# Patient Record
Sex: Female | Born: 1985
Health system: Southern US, Community
[De-identification: ages and names within clinical notes are randomized; demographics above are authoritative.]

## PROBLEM LIST (undated history)

## (undated) ENCOUNTER — Inpatient Hospital Stay (HOSPITAL_COMMUNITY): Payer: Self-pay

## (undated) DIAGNOSIS — F909 Attention-deficit hyperactivity disorder, unspecified type: Secondary | ICD-10-CM

## (undated) DIAGNOSIS — T7840XA Allergy, unspecified, initial encounter: Secondary | ICD-10-CM

## (undated) DIAGNOSIS — D649 Anemia, unspecified: Secondary | ICD-10-CM

## (undated) DIAGNOSIS — R0602 Shortness of breath: Secondary | ICD-10-CM

## (undated) DIAGNOSIS — R12 Heartburn: Secondary | ICD-10-CM

## (undated) DIAGNOSIS — F32A Depression, unspecified: Secondary | ICD-10-CM

## (undated) DIAGNOSIS — R51 Headache: Secondary | ICD-10-CM

## (undated) DIAGNOSIS — F419 Anxiety disorder, unspecified: Secondary | ICD-10-CM

## (undated) DIAGNOSIS — J189 Pneumonia, unspecified organism: Secondary | ICD-10-CM

## (undated) DIAGNOSIS — S060X9A Concussion with loss of consciousness of unspecified duration, initial encounter: Secondary | ICD-10-CM

## (undated) DIAGNOSIS — F329 Major depressive disorder, single episode, unspecified: Secondary | ICD-10-CM

## (undated) DIAGNOSIS — J45909 Unspecified asthma, uncomplicated: Secondary | ICD-10-CM

## (undated) HISTORY — DX: Unspecified asthma, uncomplicated: J45.909

## (undated) HISTORY — DX: Concussion with loss of consciousness of unspecified duration, initial encounter: S06.0X9A

## (undated) HISTORY — PX: GYNECOLOGIC CRYOSURGERY: SHX857

## (undated) HISTORY — PX: COLPOSCOPY: SHX161

## (undated) HISTORY — DX: Attention-deficit hyperactivity disorder, unspecified type: F90.9

## (undated) HISTORY — PX: WISDOM TOOTH EXTRACTION: SHX21

## (undated) HISTORY — DX: Allergy, unspecified, initial encounter: T78.40XA

---
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
## 2006-04-27 ENCOUNTER — Encounter: Admission: RE | Admit: 2006-04-27 | Discharge: 2006-04-27 | Payer: Self-pay | Admitting: Nephrology

## 2008-02-17 ENCOUNTER — Emergency Department (HOSPITAL_COMMUNITY): Admission: EM | Admit: 2008-02-17 | Discharge: 2008-02-17 | Payer: Self-pay | Admitting: Emergency Medicine

## 2008-10-04 ENCOUNTER — Emergency Department (HOSPITAL_COMMUNITY): Admission: EM | Admit: 2008-10-04 | Discharge: 2008-10-04 | Payer: Self-pay | Admitting: Family Medicine

## 2009-04-18 ENCOUNTER — Emergency Department (HOSPITAL_COMMUNITY): Admission: EM | Admit: 2009-04-18 | Discharge: 2009-04-18 | Payer: Self-pay | Admitting: Family Medicine

## 2009-04-20 ENCOUNTER — Emergency Department (HOSPITAL_COMMUNITY): Admission: EM | Admit: 2009-04-20 | Discharge: 2009-04-20 | Payer: Self-pay | Admitting: Emergency Medicine

## 2009-04-22 ENCOUNTER — Emergency Department (HOSPITAL_COMMUNITY): Admission: EM | Admit: 2009-04-22 | Discharge: 2009-04-22 | Payer: Self-pay | Admitting: Family Medicine

## 2009-12-19 ENCOUNTER — Emergency Department (HOSPITAL_COMMUNITY): Admission: EM | Admit: 2009-12-19 | Discharge: 2009-12-19 | Payer: Self-pay | Admitting: Emergency Medicine

## 2010-10-28 LAB — URINALYSIS, ROUTINE W REFLEX MICROSCOPIC
Bilirubin Urine: NEGATIVE
Glucose, UA: NEGATIVE mg/dL
Hgb urine dipstick: NEGATIVE
Ketones, ur: NEGATIVE mg/dL
Nitrite: NEGATIVE
Protein, ur: NEGATIVE mg/dL
Specific Gravity, Urine: 1.007 (ref 1.005–1.030)
Urobilinogen, UA: 0.2 mg/dL (ref 0.0–1.0)
pH: 7.5 (ref 5.0–8.0)

## 2010-10-28 LAB — COMPREHENSIVE METABOLIC PANEL WITH GFR
ALT: 16 U/L (ref 0–35)
AST: 16 U/L (ref 0–37)
Albumin: 4.2 g/dL (ref 3.5–5.2)
Alkaline Phosphatase: 52 U/L (ref 39–117)
BUN: 5 mg/dL — ABNORMAL LOW (ref 6–23)
CO2: 24 meq/L (ref 19–32)
Calcium: 9.5 mg/dL (ref 8.4–10.5)
Chloride: 104 meq/L (ref 96–112)
Creatinine, Ser: 0.69 mg/dL (ref 0.4–1.2)
GFR calc non Af Amer: >60 mL/min
Glucose, Bld: 91 mg/dL (ref 70–99)
Potassium: 3.7 meq/L (ref 3.5–5.1)
Sodium: 136 meq/L (ref 135–145)
Total Bilirubin: 0.8 mg/dL (ref 0.3–1.2)
Total Protein: 6.6 g/dL (ref 6.0–8.3)

## 2010-10-28 LAB — WET PREP, GENITAL
Clue Cells Wet Prep HPF POC: NONE SEEN
Trich, Wet Prep: NONE SEEN

## 2010-10-28 LAB — LIPASE, BLOOD: Lipase: 27 U/L (ref 11–59)

## 2010-10-28 LAB — DIFFERENTIAL
Basophils Relative: 1 % (ref 0–1)
Eosinophils Absolute: 0.1 10*3/uL (ref 0.0–0.7)
Monocytes Absolute: 0.6 10*3/uL (ref 0.1–1.0)
Monocytes Relative: 6 % (ref 3–12)
Neutro Abs: 6.4 10*3/uL (ref 1.7–7.7)

## 2010-10-28 LAB — GC/CHLAMYDIA PROBE AMP, GENITAL
Chlamydia, DNA Probe: NEGATIVE
GC Probe Amp, Genital: NEGATIVE

## 2010-10-28 LAB — POCT PREGNANCY, URINE: Preg Test, Ur: NEGATIVE

## 2010-10-28 LAB — CBC
Platelets: 263 10*3/uL (ref 150–400)
RDW: 12.1 % (ref 11.5–15.5)

## 2011-02-08 ENCOUNTER — Emergency Department (HOSPITAL_COMMUNITY): Payer: Self-pay

## 2011-02-08 ENCOUNTER — Emergency Department (HOSPITAL_COMMUNITY)
Admission: EM | Admit: 2011-02-08 | Discharge: 2011-02-08 | Disposition: A | Discharge status: Home / Self Care | Payer: Self-pay | Attending: Emergency Medicine | Admitting: Emergency Medicine

## 2011-02-08 DIAGNOSIS — N898 Other specified noninflammatory disorders of vagina: Secondary | ICD-10-CM | POA: Insufficient documentation

## 2011-02-08 DIAGNOSIS — N760 Acute vaginitis: Secondary | ICD-10-CM | POA: Insufficient documentation

## 2011-02-08 DIAGNOSIS — Z79899 Other long term (current) drug therapy: Secondary | ICD-10-CM | POA: Insufficient documentation

## 2011-02-08 DIAGNOSIS — B9689 Other specified bacterial agents as the cause of diseases classified elsewhere: Secondary | ICD-10-CM | POA: Insufficient documentation

## 2011-02-08 DIAGNOSIS — N72 Inflammatory disease of cervix uteri: Secondary | ICD-10-CM | POA: Insufficient documentation

## 2011-02-08 DIAGNOSIS — N949 Unspecified condition associated with female genital organs and menstrual cycle: Secondary | ICD-10-CM | POA: Insufficient documentation

## 2011-02-08 DIAGNOSIS — A499 Bacterial infection, unspecified: Secondary | ICD-10-CM | POA: Insufficient documentation

## 2011-02-08 LAB — WET PREP, GENITAL
Trich, Wet Prep: NONE SEEN
Yeast Wet Prep HPF POC: NONE SEEN

## 2011-02-08 LAB — URINALYSIS, ROUTINE W REFLEX MICROSCOPIC
Glucose, UA: NEGATIVE mg/dL
Leukocytes, UA: NEGATIVE
Specific Gravity, Urine: 1.028 (ref 1.005–1.030)
pH: 6 (ref 5.0–8.0)

## 2011-12-18 ENCOUNTER — Inpatient Hospital Stay (HOSPITAL_COMMUNITY): Payer: Self-pay

## 2011-12-18 ENCOUNTER — Encounter (HOSPITAL_COMMUNITY): Payer: Self-pay | Admitting: *Deleted

## 2011-12-18 ENCOUNTER — Inpatient Hospital Stay (HOSPITAL_COMMUNITY)
Admission: AD | Admit: 2011-12-18 | Discharge: 2011-12-18 | Disposition: A | Discharge status: Home / Self Care | Payer: Self-pay | Source: Ambulatory Visit | Attending: Obstetrics and Gynecology | Admitting: Obstetrics and Gynecology

## 2011-12-18 DIAGNOSIS — N938 Other specified abnormal uterine and vaginal bleeding: Secondary | ICD-10-CM | POA: Insufficient documentation

## 2011-12-18 DIAGNOSIS — N949 Unspecified condition associated with female genital organs and menstrual cycle: Secondary | ICD-10-CM

## 2011-12-18 DIAGNOSIS — R109 Unspecified abdominal pain: Secondary | ICD-10-CM | POA: Insufficient documentation

## 2011-12-18 HISTORY — DX: Anemia, unspecified: D64.9

## 2011-12-18 HISTORY — DX: Major depressive disorder, single episode, unspecified: F32.9

## 2011-12-18 HISTORY — DX: Depression, unspecified: F32.A

## 2011-12-18 HISTORY — DX: Anxiety disorder, unspecified: F41.9

## 2011-12-18 LAB — WET PREP, GENITAL
Trich, Wet Prep: NONE SEEN
Yeast Wet Prep HPF POC: NONE SEEN

## 2011-12-18 LAB — CBC
HCT: 37 % (ref 36.0–46.0)
MCHC: 33.8 g/dL (ref 30.0–36.0)
Platelets: 267 10*3/uL (ref 150–400)
RDW: 13.1 % (ref 11.5–15.5)
WBC: 11.7 10*3/uL — ABNORMAL HIGH (ref 4.0–10.5)

## 2011-12-18 LAB — URINE MICROSCOPIC-ADD ON

## 2011-12-18 LAB — DIFFERENTIAL
Basophils Absolute: 0.1 10*3/uL (ref 0.0–0.1)
Basophils Relative: 0 % (ref 0–1)
Lymphocytes Relative: 28 % (ref 12–46)
Monocytes Absolute: 0.9 10*3/uL (ref 0.1–1.0)
Neutro Abs: 7.3 10*3/uL (ref 1.7–7.7)

## 2011-12-18 LAB — URINALYSIS, ROUTINE W REFLEX MICROSCOPIC
Bilirubin Urine: NEGATIVE
Glucose, UA: NEGATIVE mg/dL
Ketones, ur: NEGATIVE mg/dL
Protein, ur: NEGATIVE mg/dL

## 2011-12-18 MED ORDER — NAPROXEN SODIUM 550 MG PO TABS: 550.0000 mg | ORAL_TABLET | Freq: Two times a day (BID) | ORAL | Status: DC

## 2011-12-18 MED ORDER — KETOROLAC TROMETHAMINE 60 MG/2ML IM SOLN
60.0000 mg | Freq: Once | INTRAMUSCULAR | Status: AC
  Administered 2011-12-18: 60 mg via INTRAMUSCULAR
  Filled 2011-12-18: qty 2

## 2011-12-18 NOTE — Discharge Instructions (Signed)
Continue your birth control pills, take the pain medication as directed. Call the GYN office to schedule a follow up appointment, return here as needed.  Abnormal Uterine Bleeding Abnormal uterine bleeding can have many causes. Some cases are simply treated, while others are more serious. There are several kinds of bleeding that is considered abnormal, including:  Bleeding between periods.   Bleeding after sexual intercourse.   Spotting anytime in the menstrual cycle.   Bleeding heavier or more than normal.   Bleeding after menopause.  CAUSES  There are many causes of abnormal uterine bleeding. It can be present in teenagers, pregnant women, women during their reproductive years, and women who have reached menopause. Your caregiver will look for the more common causes depending on your age, signs, symptoms and your particular circumstance. Most cases are not serious and can be treated. Even the more serious causes, like cancer of the female organs, can be treated adequately if found in the early stages. That is why all types of bleeding should be evaluated and treated as soon as possible. DIAGNOSIS  Diagnosing the cause may take several kinds of tests. Your caregiver may:  Take a complete history of the type of bleeding.   Perform a complete physical exam and Pap smear.   Take an ultrasound on the abdomen showing a picture of the female organs and the pelvis.   Inject dye into the uterus and Fallopian tubes and X-ray them (hysterosalpingogram).   Place fluid in the uterus and do an ultrasound (sonohysterogrqphy).   Take a CT scan to examine the female organs and pelvis.   Take an MRI to examine the female organs and pelvis. There is no X-ray involved with this procedure.   Look inside the uterus with a telescope that has a light at the end (hysteroscopy).   Scrap the inside of the uterus to get tissue to examine (Dilatation and Curettage, D&C).   Look into the pelvis with a  telescope that has a light at the end (laparoscopy). This is done through a very small cut (incision) in the abdomen.  TREATMENT  Treatment will depend on the cause of the abnormal bleeding. It can include:  Doing nothing to allow the problem to take care of itself over time.   Hormone treatment.   Birth control pills.   Treating the medical condition causing the problem.   Laparoscopy.   Major or minor surgery   Destroying the lining of the uterus with electrical currant, laser, freezing or heat (uterine ablation).  HOME CARE INSTRUCTIONS   Follow your caregiver's recommendation on how to treat your problem.   See your caregiver if you missed a menstrual period and think you may be pregnant.   If you are bleeding heavily, count the number of pads/tampons you use and how often you have to change them. Tell this to your caregiver.   Avoid sexual intercourse until the problem is controlled.  SEEK MEDICAL CARE IF:   You have any kind of abnormal bleeding mentioned above.   You feel dizzy at times.   You are 26 years old and have not had a menstrual period yet.  SEEK IMMEDIATE MEDICAL CARE IF:   You pass out.   You are changing pads/tampons every 15 to 30 minutes.   You have belly (abdominal) pain.   You have a temperature of 100 F (37.8 C) or higher.   You become sweaty or weak.   You are passing large blood clots from the vagina.  You start to feel sick to your stomach (nauseous) and throw up (vomit).  Document Released: 07/04/2005 Document Revised: 06/23/2011 Document Reviewed: 11/27/2008 [facility] Patient Information 2012 [place], [place].

## 2011-12-18 NOTE — MAU Provider Note (Signed)
History     CSN: [id number]  Arrival date and time: 12/18/11 4098   First Provider Initiated Contact with Patient 12/18/11 2014      Chief Complaint  Patient presents with  . Vaginal Bleeding  . Abdominal Pain   HPI Tammy Wilkinson is a 26 y.o. who presents to MAU for vaginal bleeding and low abdominal pain. She had a normal period 5/21 and then started bleeding again this morning. She describes the pain as 8/10 and the bleeding as heavy. She currently has 2 sex partners. One partner she has been with for 10 years and the other for 6 months. [facility]. No history of STI's. OC's for birth control.   Pertinent Gynecological History: Menses: flow is moderate Bleeding: dysfunctional uterine bleeding Contraception: OCP (estrogen/progesterone) DES exposure: None Blood transfusions: none Sexually transmitted diseases: no past history Last pap: normal Date: less than one year at [facility]   Past Medical History  Diagnosis Date  . Anemia   . Anxiety   . Depression     Past Surgical History  Procedure Date  . Wisdom tooth extraction     Family History  Problem Relation Age of Onset  . Anesthesia problems Neg Hx     History  Substance Use Topics  . Smoking status: Current Everyday Smoker -- 0.5 packs/day    Types: Cigarettes  . Smokeless tobacco: Not on file  . Alcohol Use: No    Allergies:  Allergies  Allergen Reactions  . Amoxil (Amoxicillin) Other (See Comments)    Childhood reaction hallucinations  . Penicillins Other (See Comments)    Childhood reaction hallucinations    Prescriptions prior to admission  Medication Sig Dispense Refill  . ARIPiprazole (ABILIFY) 2 MG tablet Take 2 mg by mouth daily.      . clonazePAM (KLONOPIN) 0.5 MG tablet Take 0.5 mg by mouth daily as needed. anxiety      . diphenhydrAMINE (BENADRYL) 25 MG tablet Take 25 mg by mouth every 6 (six) hours as needed. allergies      .  drospirenone-ethinyl estradiol (YASMIN,ZARAH,SYEDA) 3-0.03 MG tablet Take 1 tablet by mouth daily.      Marland Kitchen ibuprofen (ADVIL,MOTRIN) 200 MG tablet Take 600 mg by mouth every 6 (six) hours as needed. pain      . sertraline (ZOLOFT) 100 MG tablet Take 150 mg by mouth daily.        Review of Systems  Constitutional: Negative for fever, chills, weight loss and malaise/fatigue.  HENT: Positive for congestion. Negative for ear pain, nosebleeds and sore throat.   Eyes: Negative for blurred vision, double vision, photophobia and pain.  Respiratory: Negative for cough, shortness of breath and wheezing.   Cardiovascular: Negative for chest pain, palpitations and leg swelling.       Cardiac surgery at age 26 years.   Gastrointestinal: Negative for heartburn, nausea, vomiting, abdominal pain, diarrhea and constipation.  Genitourinary: Positive for dysuria, urgency and frequency. Negative for flank pain.       Vaginal bleeding  Musculoskeletal: Negative for myalgias and back pain.  Skin: Negative.  Negative for itching and rash.  Neurological: Negative for dizziness, speech change, seizures, weakness and headaches.  Endo/Heme/Allergies: Does not bruise/bleed easily.  Psychiatric/Behavioral: Negative for depression, suicidal ideas and substance abuse. The patient is not nervous/anxious and does not have insomnia.    Physical Exam   Blood pressure 102/58, pulse 86, temperature 98.6 F (37 C), temperature source Oral, resp.  rate 18, height 5\' 3"  (1.6 m), weight 115 lb (52.164 kg), last menstrual period 12/06/2011, SpO2 99.00%.  Physical Exam  Nursing note and vitals reviewed. Constitutional: She is oriented to person, place, and time. She appears well-developed and well-nourished.  HENT:  Head: Normocephalic and atraumatic.  Eyes: Conjunctivae and EOM are normal. Pupils are equal, round, and reactive to light.  Neck: Neck supple. No tracheal deviation present.  Cardiovascular: Normal rate and regular  rhythm.   Respiratory: Effort normal.  Musculoskeletal: Normal range of motion.  Neurological: She is alert and oriented to person, place, and time.  Skin: Skin is warm and dry.  Psychiatric: She has a normal mood and affect. Her behavior is normal. Judgment and thought content normal.   Results for orders placed during the hospital encounter of 12/18/11 (from the past 24 hour(s))  URINALYSIS, ROUTINE W REFLEX MICROSCOPIC     Status: Abnormal   Collection Time   12/18/11  7:45 PM      Component Value Range   Color, Urine YELLOW  YELLOW    APPearance CLEAR  CLEAR    Specific Gravity, Urine 1.020  1.005 - 1.030    pH 6.5  5.0 - 8.0    Glucose, UA NEGATIVE  NEGATIVE (mg/dL)   Hgb urine dipstick MODERATE (*) NEGATIVE    Bilirubin Urine NEGATIVE  NEGATIVE    Ketones, ur NEGATIVE  NEGATIVE (mg/dL)   Protein, ur NEGATIVE  NEGATIVE (mg/dL)   Urobilinogen, UA 0.2  0.0 - 1.0 (mg/dL)   Nitrite NEGATIVE  NEGATIVE    Leukocytes, UA NEGATIVE  NEGATIVE   URINE MICROSCOPIC-ADD ON     Status: Abnormal   Collection Time   12/18/11  7:45 PM      Component Value Range   Squamous Epithelial / LPF RARE  RARE    WBC, UA 3-6  <3 (WBC/hpf)   RBC / HPF 7-10  <3 (RBC/hpf)   Bacteria, UA FEW (*) RARE   POCT PREGNANCY, URINE     Status: Normal   Collection Time   12/18/11  7:48 PM      Component Value Range   Preg Test, Ur NEGATIVE  NEGATIVE   WET PREP, GENITAL     Status: Abnormal   Collection Time   12/18/11  8:25 PM      Component Value Range   Yeast Wet Prep HPF POC NONE SEEN  NONE SEEN    Trich, Wet Prep NONE SEEN  NONE SEEN    Clue Cells Wet Prep HPF POC FEW (*) NONE SEEN    WBC, Wet Prep HPF POC FEW (*) NONE SEEN   CBC     Status: Abnormal   Collection Time   12/18/11  8:33 PM      Component Value Range   WBC 11.7 (*) 4.0 - 10.5 (K/uL)   RBC 3.89  3.87 - 5.11 (MIL/uL)   Hemoglobin 12.5  12.0 - 15.0 (g/dL)   HCT 30.8  65.7 - 84.6 (%)   MCV 95.1  78.0 - 100.0 (fL)   MCH 32.1  26.0 - 34.0 (pg)    MCHC 33.8  30.0 - 36.0 (g/dL)   RDW 96.2  95.2 - 84.1 (%)   Platelets 267  150 - 400 (K/uL)  DIFFERENTIAL     Status: Normal   Collection Time   12/18/11  8:33 PM      Component Value Range   Neutrophils Relative 63  43 - 77 (%)   Neutro Abs  7.3  1.7 - 7.7 (K/uL)   Lymphocytes Relative 28  12 - 46 (%)   Lymphs Abs 3.2  0.7 - 4.0 (K/uL)   Monocytes Relative 8  3 - 12 (%)   Monocytes Absolute 0.9  0.1 - 1.0 (K/uL)   Eosinophils Relative 2  0 - 5 (%)   Eosinophils Absolute 0.3  0.0 - 0.7 (K/uL)   Basophils Relative 0  0 - 1 (%)   Basophils Absolute 0.1  0.0 - 0.1 (K/uL)   Assessment: Abnormal vaginal bleeding   Abdominal pain   Plan:  Continue OC's   Rx Anaprox   Follow up with GYN Clinic   Return as needed  MAU Course: discussed with Dr. Jolayne Panther  Procedures Discussed with patient in detail results of lab and ultrasound. Discussed plan to continue OC's and make appointment with GYN Clinic for follow up. Patient voices understanding.    Jaquasia Doscher 12/18/2011, 8:56 PM

## 2011-12-18 NOTE — MAU Note (Signed)
Pt reports she is on birth control pills and is not supposed to start her period until next week but started bleeding this am, "very heavy", changing a tampon q 1 hour. Lower abd cramping started at the same time the bleeding started. Denies dysuria, fever. Had episode of nausea and vomiting last night.

## 2011-12-19 LAB — GC/CHLAMYDIA PROBE AMP, GENITAL: Chlamydia, DNA Probe: NEGATIVE

## 2011-12-19 NOTE — MAU Provider Note (Signed)
Agree with above note.  Camaryn Lumbert 12/19/2011 6:01 AM

## 2012-02-06 ENCOUNTER — Emergency Department (HOSPITAL_COMMUNITY)
Admission: EM | Admit: 2012-02-06 | Discharge: 2012-02-06 | Disposition: A | Discharge status: Home Health | Payer: Self-pay | Attending: Emergency Medicine | Admitting: Emergency Medicine

## 2012-02-06 ENCOUNTER — Encounter (HOSPITAL_COMMUNITY): Payer: Self-pay | Admitting: *Deleted

## 2012-02-06 DIAGNOSIS — R42 Dizziness and giddiness: Secondary | ICD-10-CM | POA: Insufficient documentation

## 2012-02-06 DIAGNOSIS — F341 Dysthymic disorder: Secondary | ICD-10-CM | POA: Insufficient documentation

## 2012-02-06 DIAGNOSIS — R55 Syncope and collapse: Secondary | ICD-10-CM | POA: Insufficient documentation

## 2012-02-06 DIAGNOSIS — F17200 Nicotine dependence, unspecified, uncomplicated: Secondary | ICD-10-CM | POA: Insufficient documentation

## 2012-02-06 LAB — CBC WITH DIFFERENTIAL/PLATELET
Basophils Absolute: 0 10*3/uL (ref 0.0–0.1)
Basophils Relative: 0 % (ref 0–1)
Eosinophils Absolute: 0.3 10*3/uL (ref 0.0–0.7)
HCT: 38.9 % (ref 36.0–46.0)
Hemoglobin: 13.4 g/dL (ref 12.0–15.0)
MCH: 32.2 pg (ref 26.0–34.0)
MCHC: 34.4 g/dL (ref 30.0–36.0)
Monocytes Absolute: 0.7 10*3/uL (ref 0.1–1.0)
Monocytes Relative: 5 % (ref 3–12)
Neutrophils Relative %: 65 % (ref 43–77)
RDW: 12.5 % (ref 11.5–15.5)

## 2012-02-06 LAB — BASIC METABOLIC PANEL
BUN: 17 mg/dL (ref 6–23)
Calcium: 10 mg/dL (ref 8.4–10.5)
Creatinine, Ser: 0.57 mg/dL (ref 0.50–1.10)
GFR calc Af Amer: >90 mL/min (ref >90)
GFR calc non Af Amer: >90 mL/min (ref >90)

## 2012-02-06 LAB — PREGNANCY, URINE: Preg Test, Ur: NEGATIVE

## 2012-02-06 NOTE — ED Provider Notes (Signed)
History     CSN: [id number]  Arrival date & time 02/06/12  1257   First MD Initiated Contact with Patient 02/06/12 1307      Chief Complaint  Patient presents with  . Dizziness    (Consider location/radiation/quality/duration/timing/severity/associated sxs/prior treatment) HPI Comments: Patient reports that she had a syncopal episode earlier today.  Syncope was witnessed by a Detective who patient reports told her that she was unconscious for approximately one minute.  She reports that at the time of the Syncopal event she was talking with a Detective outside in the heat.  She was talking with the Detective about her sister's recent death.  She reports that she felt lightheaded prior to the syncope.  She also stated that she was starting to black out and felt warm.  She reports that her symptoms have improved at this time.  She has never had this happen before.  No prior history of cardiac disease.  She does have a prior history of Anemia, Anxiety, and Depression.    Patient is a 26 y.o. female presenting with syncope. The history is provided by the patient.  Loss of Consciousness Pertinent negatives include no abdominal pain, chest pain, chills, fever, headaches, nausea, neck pain, numbness, rash, vomiting or weakness.    Past Medical History  Diagnosis Date  . Anemia   . Anxiety   . Depression     Past Surgical History  Procedure Date  . Wisdom tooth extraction     Family History  Problem Relation Age of Onset  . Anesthesia problems Neg Hx     History  Substance Use Topics  . Smoking status: Current Everyday Smoker -- 0.5 packs/day    Types: Cigarettes  . Smokeless tobacco: Not on file  . Alcohol Use: No    OB History    Grav Para Term Preterm Abortions TAB SAB Ect Mult Living   1 1 1       1       Review of Systems  Constitutional: Negative for fever and chills.  HENT: Negative for neck pain and neck stiffness.   Respiratory: Negative for shortness of breath.    Cardiovascular: Positive for syncope. Negative for chest pain, palpitations and leg swelling.  Gastrointestinal: Negative for nausea, vomiting, abdominal pain and blood in stool.  Genitourinary: Negative for vaginal bleeding.  Musculoskeletal: Negative for gait problem.  Skin: Negative for color change and rash.  Neurological: Positive for syncope and light-headedness. Negative for weakness, numbness and headaches.  Psychiatric/Behavioral: Negative for confusion.    Allergies  Amoxil and Penicillins  Home Medications   Current Outpatient Rx  Name Route Sig Dispense Refill  . ARIPIPRAZOLE 2 MG PO TABS Oral Take 2 mg by mouth daily.    Marland Kitchen CLONAZEPAM 0.5 MG PO TABS Oral Take 0.5 mg by mouth daily as needed. anxiety    . DIPHENHYDRAMINE HCL 25 MG PO TABS Oral Take 25 mg by mouth every 6 (six) hours as needed. allergies    . DROSPIRENONE-ETHINYL ESTRADIOL 3-0.03 MG PO TABS Oral Take 1 tablet by mouth daily.    . IBUPROFEN 200 MG PO TABS Oral Take 600 mg by mouth every 6 (six) hours as needed. pain    . SERTRALINE HCL 100 MG PO TABS Oral Take 150 mg by mouth daily.      BP 102/64  Pulse 85  Temp 98.5 F (36.9 C) (Oral)  Resp 18  LMP 01/07/2012  Physical Exam  Nursing note and vitals reviewed. Constitutional: She  appears well-developed and well-nourished. No distress.  HENT:  Head: Normocephalic and atraumatic.  Mouth/Throat: Oropharynx is clear and moist.  Eyes: EOM are normal. Pupils are equal, round, and reactive to light.  Neck: Normal range of motion. Neck supple.  Cardiovascular: Normal rate, regular rhythm, normal heart sounds and intact distal pulses.   Pulmonary/Chest: Effort normal and breath sounds normal.  Musculoskeletal: Normal range of motion.  Neurological: She is alert. She has normal strength. No cranial nerve deficit or sensory deficit. She displays a negative Romberg sign. Coordination and gait normal.       Normal finger to nose testing Normal Rapid  Alternating movements No ataxia  Skin: Skin is warm and dry. She is not diaphoretic.  Psychiatric: She has a normal mood and affect.    ED Course  Procedures (including critical care time)   Labs Reviewed  CBC WITH DIFFERENTIAL  BASIC METABOLIC PANEL  PREGNANCY, URINE   No results found.   No diagnosis found.   Date: 02/06/2012  Rate: 74  Rhythm: normal sinus rhythm  QRS Axis: normal  Intervals: normal  ST/T Wave abnormalities: normal  Conduction Disutrbances:none  Narrative Interpretation:   Old EKG Reviewed: none available    MDM  Patient presenting with a chief complaint of syncope.  Syncopal event occurred while she was talking to a Detective regarding her sisters recent death.  Suspect that syncope was vasovagal.  Syncopal event was witnessed and according to her the detective reported that she loss consciousness for approximately one minute.  Normal EKG.  Labs unremarkable.  Patient not orthostatic.  Normal neurological exam.  No CP or SOB.  VSS.  Therefore, feel that patient can be discharged home.  Return precautions discussed with patient.  Patient in agreement with the plan.        Pascal Lux [place], PA-C 02/07/12 2225

## 2012-02-06 NOTE — ED Notes (Signed)
Patient states she passed out twice today, patient states one episode post standing from lying position after blowing nose and another she was witnessed falling and witness states her knees buckled and she fell to ground.  Patient hit head on screen door during second episode , now with c/o headache and slight dizziness

## 2012-02-10 NOTE — ED Provider Notes (Signed)
Medical screening examination/treatment/procedure(s) were performed by non-physician practitioner and as supervising physician I was immediately available for consultation/collaboration.  Topaz Raglin L Kevontay Burks, MD 02/10/12 0723 

## 2012-03-01 ENCOUNTER — Encounter: Payer: Self-pay | Admitting: Family

## 2012-03-01 ENCOUNTER — Ambulatory Visit (INDEPENDENT_AMBULATORY_CARE_PROVIDER_SITE_OTHER): Payer: Self-pay | Admitting: Family

## 2012-03-01 VITALS — BP 128/74 | HR 96 | Temp 97.9°F | Ht 63.0 in | Wt 122.6 lb

## 2012-03-01 DIAGNOSIS — N921 Excessive and frequent menstruation with irregular cycle: Secondary | ICD-10-CM

## 2012-03-01 MED ORDER — METRONIDAZOLE 500 MG PO TABS: 500.0000 mg | ORAL_TABLET | Freq: Two times a day (BID) | ORAL | Status: AC

## 2012-03-01 MED ORDER — NORETHIN ACE-ETH ESTRAD-FE 1.5-30 MG-MCG PO TABS: 1.0000 | ORAL_TABLET | Freq: Every day | ORAL | Status: DC

## 2012-03-01 NOTE — Progress Notes (Signed)
Patient ID: Tammy Wilkinson, female   DOB: February 25, 1986, 26 y.o.   MRN: [id number] Subjective:     She is a 26 year old who presents with irregular menses for 1 year with heaving bleeding between periods.  Most recently she had an episode of heavy menses 2 months ago with severe pelvic pain.  Was evaluated in the MAU with cultures, wet prep and pelvic US all negative.  Pelvic pain continues but is worse with periods.  LMP was on 02/08/12.  Has dark brown spotting in past few days.  No discharge noted.  History of abnormal Pap smear with cryotherapy but most recent Paps have been normal.  Great grandmother deceased with cervical cancer, sister with hysterectomy before age 23 with endometriosis.  ROS negative for fever, chills, nausea, vomiting, discharge, dysuria.    Menstrual History: OB History    Grav Para Term Preterm Abortions TAB SAB Ect Mult Living   1 1 1       1        Patient's last menstrual period was 02/08/2012.    The following portions of the patient's history were reviewed and updated as appropriate: allergies, current medications, past family history, past medical history, past social history, past surgical history and problem list.  Review of Systems Pertinent items are noted in HPI.    Objective:    BP 128/74  Pulse 96  Temp 97.9 F (36.6 C) (Oral)  Ht 5\' 3"  (1.6 m)  Wt 55.611 kg (122 lb 9.6 oz)  BMI 21.72 kg/m2  LMP 02/08/2012  General:   alert, cooperative and appears stated age  Skin:    normal and multiple tatoos  Neck:  no adenopathy, no carotid bruit, no JVD, supple, symmetrical, trachea midline and thyroid not enlarged, symmetric, no tenderness/mass/nodules  Abdomen:  soft, non-tender; bowel sounds normal; no masses,  no organomegaly  Pelvic:   cervix normal in appearance, external genitalia normal, no adnexal masses or tenderness, no cervical motion tenderness, rectovaginal septum normal, uterus normal size, shape, and consistency and dark brown discharge noted  in vagina.     Assessment:    Breakthrough Bleeding    Plan:    Blood tests: TSH.  Change oral contraception to LoEstrin 1.5/30; start when current ocps stop. Continue naproxen Reevaluate in 6 weeks. [facility]

## 2012-04-12 ENCOUNTER — Ambulatory Visit: Payer: Self-pay | Admitting: Obstetrics and Gynecology

## 2012-04-30 ENCOUNTER — Ambulatory Visit: Payer: Self-pay | Admitting: Family Medicine

## 2012-05-10 ENCOUNTER — Encounter (HOSPITAL_COMMUNITY): Payer: Self-pay | Admitting: Family Medicine

## 2012-05-10 ENCOUNTER — Emergency Department (HOSPITAL_COMMUNITY)
Admission: EM | Admit: 2012-05-10 | Discharge: 2012-05-10 | Disposition: A | Discharge status: Home / Self Care | Payer: Self-pay | Attending: Emergency Medicine | Admitting: Emergency Medicine

## 2012-05-10 DIAGNOSIS — Z8659 Personal history of other mental and behavioral disorders: Secondary | ICD-10-CM | POA: Insufficient documentation

## 2012-05-10 DIAGNOSIS — Z79899 Other long term (current) drug therapy: Secondary | ICD-10-CM | POA: Insufficient documentation

## 2012-05-10 DIAGNOSIS — F909 Attention-deficit hyperactivity disorder, unspecified type: Secondary | ICD-10-CM | POA: Insufficient documentation

## 2012-05-10 DIAGNOSIS — R109 Unspecified abdominal pain: Secondary | ICD-10-CM

## 2012-05-10 DIAGNOSIS — F17200 Nicotine dependence, unspecified, uncomplicated: Secondary | ICD-10-CM | POA: Insufficient documentation

## 2012-05-10 DIAGNOSIS — R1011 Right upper quadrant pain: Secondary | ICD-10-CM | POA: Insufficient documentation

## 2012-05-10 LAB — RAPID URINE DRUG SCREEN, HOSP PERFORMED
Amphetamines: NOT DETECTED
Barbiturates: NOT DETECTED
Benzodiazepines: NOT DETECTED
Tetrahydrocannabinol: NOT DETECTED

## 2012-05-10 LAB — URINALYSIS, ROUTINE W REFLEX MICROSCOPIC
Bilirubin Urine: NEGATIVE
Glucose, UA: NEGATIVE mg/dL
Hgb urine dipstick: NEGATIVE
Ketones, ur: NEGATIVE mg/dL
Leukocytes, UA: NEGATIVE
pH: 7.5 (ref 5.0–8.0)

## 2012-05-10 MED ORDER — TRAMADOL HCL 50 MG PO TABS: 50.0000 mg | ORAL_TABLET | Freq: Four times a day (QID) | ORAL | Status: DC | PRN

## 2012-05-10 MED ORDER — KETOROLAC TROMETHAMINE 30 MG/ML IJ SOLN
30.0000 mg | Freq: Once | INTRAMUSCULAR | Status: AC
  Administered 2012-05-10: 30 mg via INTRAMUSCULAR
  Filled 2012-05-10: qty 1

## 2012-05-10 MED ORDER — ONDANSETRON 4 MG PO TBDP
4.0000 mg | ORAL_TABLET | Freq: Once | ORAL | Status: AC
  Administered 2012-05-10: 4 mg via ORAL
  Filled 2012-05-10: qty 1

## 2012-05-10 NOTE — ED Notes (Signed)
Patient reports that she has had lower abdominal pain since Sunday that has gotten worse. Reports nausea/vomiting/diarrhea. Denies vaginal discharge. Reports dysuria.

## 2012-05-10 NOTE — ED Provider Notes (Signed)
History     CSN: [id number]  Arrival date & time 05/10/12  0222   First MD Initiated Contact with Patient 05/10/12 [phone]      Chief Complaint  Patient presents with  . Abdominal Pain    (Consider location/radiation/quality/duration/timing/severity/associated sxs/prior treatment) HPI Comments: Patient states she has chronic abdominal pain.  She is currently being followed by OB/GYN, who started her on a new birth control to to her frequent episodes of "large follicles" .  She states, that she's been having discomfort for the last several, days.  Her last menstrual cycle ended 2 days ago, and she started her new pack of pills.  She denies any vaginal discharge, dysuria, frequency, nausea, vomiting, constipation, states she could not go to work yesterday because the pain was so horrible, that she had to stay in bed.  All day, with a heating pad.  She did try ibuprofen, with no relief.  Presents to the emergency department, with continued, pain.  That's not quite as bad as it was yesterday.  She describes it as having toothpicks inside.  Patient is a 26 y.o. female presenting with abdominal pain. The history is provided by the patient.  Abdominal Pain The primary symptoms of the illness include abdominal pain and nausea. The primary symptoms of the illness do not include fever, vomiting, diarrhea, dysuria, vaginal discharge or vaginal bleeding. The current episode started more than 2 days ago. The onset of the illness was gradual. The problem has not changed since onset. Symptoms associated with the illness do not include chills, urgency or back pain.    Past Medical History  Diagnosis Date  . Anemia   . Anxiety   . Depression   . ADHD (attention deficit hyperactivity disorder)     Past Surgical History  Procedure Date  . Wisdom tooth extraction     Family History  Problem Relation Age of Onset  . Anesthesia problems Neg Hx     History  Substance Use Topics  . Smoking status:  Current Every Day Smoker -- 1.0 packs/day    Types: Cigarettes  . Smokeless tobacco: Never Used  . Alcohol Use: No    OB History    Grav Para Term Preterm Abortions TAB SAB Ect Mult Living   1 1 1       1       Review of Systems  Constitutional: Negative for fever and chills.  Gastrointestinal: Positive for nausea and abdominal pain. Negative for vomiting, diarrhea and rectal pain.  Genitourinary: Positive for pelvic pain. Negative for dysuria, urgency, flank pain, decreased urine volume, vaginal bleeding, vaginal discharge, difficulty urinating, genital sores, vaginal pain, menstrual problem and dyspareunia.  Musculoskeletal: Negative for back pain.  Skin: Negative for rash and wound.  Neurological: Negative for dizziness and weakness.    Allergies  Amoxil and Penicillins  Home Medications   Current Outpatient Rx  Name Route Sig Dispense Refill  . AMPHETAMINE-DEXTROAMPHETAMINE 10 MG PO TABS Oral Take 10 mg by mouth daily.    . ARIPIPRAZOLE 2 MG PO TABS Oral Take 2 mg by mouth daily.    Marland Kitchen CLONAZEPAM 0.5 MG PO TABS Oral Take 0.5 mg by mouth daily as needed. anxiety    . IBUPROFEN 200 MG PO TABS Oral Take 800 mg by mouth every 6 (six) hours as needed. pain    . NORETHINDRONE ACET-ETHINYL EST 1.5-30 MG-MCG PO TABS Oral Take 1 tablet by mouth daily.    . SERTRALINE HCL 100 MG PO TABS Oral  Take 150 mg by mouth daily.    . TRAMADOL HCL 50 MG PO TABS Oral Take 1 tablet (50 mg total) by mouth every 6 (six) hours as needed for pain. 15 tablet 0    BP 111/70  Pulse 95  Temp 98.3 F (36.8 C) (Oral)  Resp 20  SpO2 100%  LMP 05/01/2012  Physical Exam  Constitutional: She is oriented to person, place, and time. She appears well-developed and well-nourished.  HENT:  Head: Normocephalic.  Eyes: Pupils are equal, round, and reactive to light.  Neck: Normal range of motion.  Cardiovascular: Normal rate.   Abdominal: Soft. Bowel sounds are normal. She exhibits no distension and no  mass. There is hepatosplenomegaly. There is tenderness in the right lower quadrant. There is no rebound, no guarding, no CVA tenderness, no tenderness at McBurney's point and negative Murphy's sign.    Musculoskeletal: Normal range of motion.  Neurological: She is alert and oriented to person, place, and time.  Skin: Skin is warm. No rash noted. No erythema.    ED Course  Procedures (including critical care time)   Labs Reviewed  PREGNANCY, URINE  URINALYSIS, ROUTINE W REFLEX MICROSCOPIC  URINE RAPID DRUG SCREEN (HOSP PERFORMED)   No results found.   1. Abdominal pain       MDM          Arman Filter, NP 05/10/12 2022

## 2012-05-10 NOTE — ED Provider Notes (Signed)
Medical screening examination/treatment/procedure(s) were performed by non-physician practitioner and as supervising physician I was immediately available for consultation/collaboration.   Jediah Horger L Micaela Stith, MD 05/10/12 2258 

## 2012-05-31 ENCOUNTER — Other Ambulatory Visit: Payer: Self-pay | Admitting: Family

## 2013-03-01 ENCOUNTER — Ambulatory Visit (INDEPENDENT_AMBULATORY_CARE_PROVIDER_SITE_OTHER): Payer: PRIVATE HEALTH INSURANCE | Admitting: Internal Medicine

## 2013-03-01 VITALS — BP 110/70 | HR 104 | Temp 99.3°F | Resp 18 | Wt 140.0 lb

## 2013-03-01 DIAGNOSIS — J040 Acute laryngitis: Secondary | ICD-10-CM

## 2013-03-01 DIAGNOSIS — F411 Generalized anxiety disorder: Secondary | ICD-10-CM

## 2013-03-01 DIAGNOSIS — F4323 Adjustment disorder with mixed anxiety and depressed mood: Secondary | ICD-10-CM

## 2013-03-01 NOTE — Progress Notes (Signed)
  Subjective:    Patient ID: Tammy Wilkinson, female    DOB: [birth year], 27 y.o.   MRN: [id number]  HPI Last Saturday, inhaled a piece of rice and has noticed change in voice and starting yesterday had sensation of having something on the leftt side of her throat. Has felt feverish. States that she feels anxious at doctor visits and that is why her heart rate is elevated. Has used ibuprofen, acetaminophen, benadryl with some relief. Has an appointment with Dr. Hal Hope next week for a complete physical exam. Patient reports that she is being seen at [facility] for her depression, anxiety and PTSD and feels like she is currently doing well and has the support she needs.   Review of Systems  Constitutional: Positive for fever and fatigue.  HENT: Positive for sore throat, trouble swallowing, neck pain, voice change and postnasal drip. Negative for ear pain, congestion and rhinorrhea.   Eyes: Negative for redness and itching.  Respiratory: Negative for cough, choking, chest tightness and shortness of breath.   Allergic/Immunologic: Positive for environmental allergies.  Neurological: Negative for headaches.  Psychiatric/Behavioral: Negative for suicidal ideas and self-injury. The patient is nervous/anxious.        Objective:   Physical Exam  Constitutional: She is oriented to person, place, and time. She appears well-developed and well-nourished. No distress.  HENT:  Right Ear: Tympanic membrane and external ear normal.  Eyes: Conjunctivae are normal. Pupils are equal, round, and reactive to light.  Neck: No thyromegaly present.  Cardiovascular: Normal rate, regular rhythm and normal heart sounds.   Pulmonary/Chest: Effort normal and breath sounds normal.  Lymphadenopathy:    She has no cervical adenopathy.  Neurological: She is alert and oriented to person, place, and time.  Skin: Skin is warm and dry.  Psychiatric: She has a normal mood and affect. Her behavior is normal. Thought content  normal.          Assessment & Plan:   Laryngitis-viral URI  Muconex DM samples provide, encouraged to continue OTC analgesics, laryngitis patient information provided. Anxiety/depression- continue current meds/therapy

## 2013-03-01 NOTE — Patient Instructions (Addendum)
Laryngitis At the top of your windpipe is your voice box. It is the source of your voice. Inside your voice box are 2 bands of muscles called vocal cords. When you breathe, your vocal cords are relaxed and open so that air can get into the lungs. When you decide to say something, these cords come together and vibrate. The sound from these vibrations goes into your throat and comes out through your mouth as sound. Laryngitis is an inflammation of the vocal cords that causes hoarseness, cough, loss of voice, sore throat, and dry throat. Laryngitis can be temporary (acute) or long-term (chronic). Most cases of acute laryngitis improve with time.Chronic laryngitis lasts for more than 3 weeks. CAUSES Laryngitis can often be related to excessive smoking, talking, or yelling, as well as inhalation of toxic fumes and allergies. Acute laryngitis is usually caused by a viral infection, vocal strain, measles or mumps, or bacterial infections. Chronic laryngitis is usually caused by vocal cord strain, vocal cord injury, postnasal drip, growths on the vocal cords, or acid reflux. SYMPTOMS   Cough.  Sore throat.  Dry throat. RISK FACTORS  Respiratory infections.  Exposure to irritating substances, such as cigarette smoke, excessive amounts of alcohol, stomach acids, and workplace chemicals.  Voice trauma, such as vocal cord injury from shouting or speaking too loud. DIAGNOSIS  Your cargiver will perform a physical exam. During the physical exam, your caregiver will examine your throat. The most common sign of laryngitis is hoarseness. Laryngoscopy may be necessary to confirm the diagnosis of this condition. This procedure allows your caregiver to look into the larynx. HOME CARE INSTRUCTIONS  Drink enough fluids to keep your urine clear or pale yellow.  Rest until you no longer have symptoms or as directed by your caregiver.  Breathe in moist air.  Take all medicine as directed by your  caregiver.  Do not smoke.  Talk as little as possible (this includes whispering).  Write on paper instead of talking until your voice is back to normal.  Follow up with your caregiver if your condition has not improved after 10 days. SEEK MEDICAL CARE IF:   You have trouble breathing.  You cough up blood.  You have persistent fever.  You have increasing pain.  You have difficulty swallowing. MAKE SURE YOU:  Understand these instructions.  Will watch your condition.  Will get help right away if you are not doing well or get worse. Document Released: 07/04/2005 Document Revised: 09/26/2011 Document Reviewed: 09/09/2010 [employer] Patient Information 2014 [employer].  

## 2013-03-14 ENCOUNTER — Telehealth: Payer: Self-pay

## 2013-03-14 DIAGNOSIS — R491 Aphonia: Secondary | ICD-10-CM

## 2013-03-14 NOTE — Telephone Encounter (Signed)
Patient would like a referral to an ENT, her condition is not improving.

## 2013-03-26 ENCOUNTER — Encounter (HOSPITAL_COMMUNITY): Payer: Self-pay

## 2013-03-26 NOTE — H&P (Signed)
Assessment  Hoarseness (784.42) (R49.0). Smokes tobacco daily (305.1) (Z72.0). 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 Discussed  Idiopathic left cord paralysis. Recommend at least a chest x-ray for now to evaluate the upper mediastinum. We'll discuss further testing and intervention following that. Recommend a chin tuck and when she drinks liquids. Recommend she stop smoking. Reason For Visit  Tammy Wilkinson is here today at the kind request of Ellamae Sia for consultation and opinion for hoarseness. HPI  One month history of hoarseness ever since choking on a piece of rice about a month ago. Prior to that, she had no trouble. Hasn't changed really since it started. She denies any sore throat or trouble swallowing. She does smoke about half pack per day. She is having a little trouble drinking liquids, it causes her to cough. Allergies  Amoxicillin TABS Latex. Current Meds  Prazosin HCl - 1 MG Oral Capsule;; RPT Effexor XR 75 MG Oral Capsule Extended Release 24 Hour (Venlafaxine HCl ER);; RPT HydrOXYzine HCl TABS;; RPT Adderall TABS (Amphetamine-Dextroamphetamine);; RPT Ortho Tri-Cyclen (28) TABS (Norgestim-Eth Estrad Triphasic);; RPT. Active Problems  Migraine headache   (346.90) (G43.909). PSH  Oral Surgery Tooth Extraction. Family Hx  Family history of hearing loss: Father (V19.2) (Z82.2) Family history of hypertension: Mother,Father (V17.49) (Z82.49) Family history of migraine headaches: Father (V17.2) (Z82.0). Personal Hx  Caffeine use (305.90) (F15.929); 1 cup daly Never smoker (V49.89) (Z78.9) No alcohol use Non-smoker (V49.89) (Z78.9). ROS  Systemic: Not feeling tired (fatigue).  No fever, no night sweats, and no recent weight loss. Head: Headache. Eyes: No eye symptoms. Otolaryngeal: No hearing loss, no earache, no tinnitus, and no purulent nasal discharge.  No nasal passage blockage (stuffiness), no snoring, and no sneezing.  Hoarseness.  No sore  throat. Cardiovascular: No chest pain or discomfort  and no palpitations. Pulmonary: No dyspnea, no cough, and no wheezing. Gastrointestinal: No dysphagia  and no heartburn.  No nausea, no abdominal pain, and no melena.  No diarrhea. Genitourinary: No dysuria. Endocrine: No muscle weakness. Musculoskeletal: No calf muscle cramps, no arthralgias, and no soft tissue swelling. Neurological: No dizziness, no fainting, no tingling, and no numbness. Psychological: Anxiety  and depression. Skin: No rash. 12 system ROS was obtained and reviewed on the Health Maintenance form dated today.  Positive responses are shown above.  If the symptom is not checked, the patient has denied it. Vital Signs   Recorded by Skolimowski,Sharon on 19 Mar 2013 01:38 PM BP:90/60,  Height: 63 in, Weight: 140 lb, BMI: 24.8 kg/m2,  BMI Calculated: 24.80 ,  BSA Calculated: 1.66. Physical Exam  APPEARANCE: Well developed, well nourished, in no acute distress.  Normal affect, in a pleasant mood.  Oriented to time, place and person. COMMUNICATION: Normal voice   HEAD & FACE:  No scars, lesions or masses of head and face.  Sinuses nontender to palpation.  Salivary glands without mass or tenderness.  Facial strength symmetric.  No facial lesion, scars, or mass. EYES: EOMI with normal primary gaze alignment. Visual acuity grossly intact.  PERRLA EXTERNAL EAR & NOSE: No scars, lesions or masses  EAC & TYMPANIC MEMBRANE:  EAC shows no obstructing lesions or debris and tympanic membranes are normal bilaterally with good movement to insufflation. GROSS HEARING: Normal   TMJ:  Nontender  INTRANASAL EXAM: No polyps or purulence.  NASOPHARYNX: Normal, without lesions. LIPS, TEETH & GUMS: No lip lesions, normal dentition and normal gums. ORAL CAVITY/OROPHARYNX:  Oral mucosa moist without lesion or asymmetry of the palate, tongue, tonsil or  posterior pharynx. Tongue is pierced. Indirect exam inadequate to visualize the vocal  cords. NECK:  Supple without adenopathy or mass. THYROID:  Normal with no masses palpable.  NEUROLOGIC:  No gross CN deficits. No nystagmus noted.   LYMPHATIC:  No enlarged nodes palpable. Procedure  Fiberoptic Laryngoscopy Name: Tammy Wilkinson     Age: 27 year     The risks and benefits of this procedure have been thoroughly discussed with the patient.  The most commons risks outlined included but were not limited to: injury  to the nasal mucosa or throat irritation.  The patient was further informed that there are other less common risks.  The patient was given the opportunity to ask questions and all such questions were answered to the patient's satisfaction.  Patient acknowledged the risks and has agreed to proceed.   Performing Provider: Serena Colonel The risks of the procedure are minimal and were discussed with the patient today. Pre-op Diagnosis: hoarseness  Post-op Diagnosis: TVC palsy /L  Allergy:  reviewed allergies as listed Nasal Prep:Lidocaine/Afrin   Procedure:     With the patient seated in the exam chair, the R nasal cavity was intubated with the flexible laryngoscope.  The nasal cavity mucosa, nasopharynx, hypopharynx and larynx were all examined with findings as noted.  The scope was then removed.  The patient tolerated the procedure well without complication or blood loss (unless indicated in findings).   FINDINGS: Nasal mucosa: NPR Nasopharynx: NPR Hypopharynx: NPR Larynx: NPR except TVC paralysis/L . Signature  Electronically signed by : Serena Colonel  M.D.; 03/19/2013 1:57 PM EST.

## 2013-03-28 ENCOUNTER — Encounter (HOSPITAL_COMMUNITY): Payer: Self-pay | Admitting: Otolaryngology

## 2013-03-28 NOTE — Progress Notes (Signed)
Pt C/O SOB but denies chest pain and being under the care of a cardiologist. Pt denies having and EKG and chest x ray within the last year. Pt denies having a cardiac cath, echo ,and stress test.

## 2013-03-29 ENCOUNTER — Ambulatory Visit (HOSPITAL_COMMUNITY): Payer: No Typology Code available for payment source

## 2013-03-29 ENCOUNTER — Encounter (HOSPITAL_COMMUNITY)
Admission: RE | Disposition: A | Discharge status: Home / Self Care | Payer: Self-pay | Source: Ambulatory Visit | Attending: Otolaryngology

## 2013-03-29 ENCOUNTER — Ambulatory Visit (HOSPITAL_COMMUNITY): Payer: No Typology Code available for payment source | Admitting: Anesthesiology

## 2013-03-29 ENCOUNTER — Observation Stay (HOSPITAL_COMMUNITY)
Admission: RE | Admit: 2013-03-29 | Discharge: 2013-03-30 | Disposition: A | Discharge status: Home / Self Care | Payer: No Typology Code available for payment source | Source: Ambulatory Visit | Attending: Otolaryngology | Admitting: Otolaryngology

## 2013-03-29 ENCOUNTER — Encounter (HOSPITAL_COMMUNITY): Payer: Self-pay | Admitting: Anesthesiology

## 2013-03-29 DIAGNOSIS — J3800 Paralysis of vocal cords and larynx, unspecified: Secondary | ICD-10-CM

## 2013-03-29 DIAGNOSIS — F17200 Nicotine dependence, unspecified, uncomplicated: Secondary | ICD-10-CM | POA: Insufficient documentation

## 2013-03-29 DIAGNOSIS — J3801 Paralysis of vocal cords and larynx, unilateral: Principal | ICD-10-CM | POA: Insufficient documentation

## 2013-03-29 DIAGNOSIS — Z23 Encounter for immunization: Secondary | ICD-10-CM | POA: Insufficient documentation

## 2013-03-29 HISTORY — DX: Shortness of breath: R06.02

## 2013-03-29 HISTORY — DX: Heartburn: R12

## 2013-03-29 HISTORY — PX: LARYNGOPLASTY: SHX282

## 2013-03-29 HISTORY — DX: Headache: R51

## 2013-03-29 HISTORY — DX: Pneumonia, unspecified organism: J18.9

## 2013-03-29 LAB — BASIC METABOLIC PANEL
GFR calc Af Amer: >90 mL/min (ref >90)
GFR calc non Af Amer: >90 mL/min (ref >90)
Glucose, Bld: 85 mg/dL (ref 70–99)
Potassium: 3.8 mEq/L (ref 3.5–5.1)
Sodium: 136 mEq/L (ref 135–145)

## 2013-03-29 LAB — CBC
Hemoglobin: 13.1 g/dL (ref 12.0–15.0)
MCH: 32 pg (ref 26.0–34.0)
RBC: 4.1 MIL/uL (ref 3.87–5.11)

## 2013-03-29 LAB — HCG, SERUM, QUALITATIVE: Preg, Serum: NEGATIVE

## 2013-03-29 SURGERY — LARYNGOPLASTY
Anesthesia: General | Site: Throat | Laterality: Left | Wound class: Clean

## 2013-03-29 MED ORDER — PNEUMOCOCCAL VAC POLYVALENT 25 MCG/0.5ML IJ INJ
0.5000 mL | INJECTION | INTRAMUSCULAR | Status: AC
  Administered 2013-03-30: 0.5 mL via INTRAMUSCULAR
  Filled 2013-03-29: qty 0.5

## 2013-03-29 MED ORDER — FENTANYL CITRATE 0.05 MG/ML IJ SOLN
INTRAMUSCULAR | Status: DC | PRN
  Administered 2013-03-29: 100 ug via INTRAVENOUS
  Administered 2013-03-29 (×2): 25 ug via INTRAVENOUS

## 2013-03-29 MED ORDER — DEXTROSE-NACL 5-0.9 % IV SOLN
INTRAVENOUS | Status: DC
  Administered 2013-03-29 – 2013-03-30 (×2): via INTRAVENOUS

## 2013-03-29 MED ORDER — FENTANYL CITRATE 0.05 MG/ML IJ SOLN
25.0000 ug | INTRAMUSCULAR | Status: DC | PRN
  Administered 2013-03-29 (×4): 25 ug via INTRAVENOUS

## 2013-03-29 MED ORDER — PROMETHAZINE HCL 25 MG PO TABS: 25.0000 mg | ORAL_TABLET | Freq: Four times a day (QID) | ORAL | Status: DC | PRN

## 2013-03-29 MED ORDER — PRAZOSIN HCL 1 MG PO CAPS
1.0000 mg | ORAL_CAPSULE | Freq: Every day | ORAL | Status: DC
  Administered 2013-03-29: 1 mg via ORAL
  Filled 2013-03-29 (×2): qty 1

## 2013-03-29 MED ORDER — BACITRACIN ZINC 500 UNIT/GM EX OINT
TOPICAL_OINTMENT | CUTANEOUS | Status: AC
  Filled 2013-03-29: qty 15

## 2013-03-29 MED ORDER — AMPHETAMINE-DEXTROAMPHETAMINE 10 MG PO TABS
20.0000 mg | ORAL_TABLET | Freq: Two times a day (BID) | ORAL | Status: DC
  Administered 2013-03-29 – 2013-03-30 (×2): 20 mg via ORAL
  Filled 2013-03-29 (×2): qty 2

## 2013-03-29 MED ORDER — NORETHINDRONE ACET-ETHINYL EST 1.5-30 MG-MCG PO TABS: 1.0000 | ORAL_TABLET | Freq: Every day | ORAL | Status: DC

## 2013-03-29 MED ORDER — OXYMETAZOLINE HCL 0.05 % NA SOLN
NASAL | Status: AC
  Filled 2013-03-29: qty 15

## 2013-03-29 MED ORDER — MIDAZOLAM HCL 5 MG/5ML IJ SOLN
INTRAMUSCULAR | Status: DC | PRN
  Administered 2013-03-29: 2 mg via INTRAVENOUS

## 2013-03-29 MED ORDER — CLINDAMYCIN HCL 300 MG PO CAPS: 300.0000 mg | ORAL_CAPSULE | Freq: Three times a day (TID) | ORAL | Status: DC

## 2013-03-29 MED ORDER — DIPHENHYDRAMINE HCL 25 MG PO CAPS: 25.0000 mg | ORAL_CAPSULE | Freq: Four times a day (QID) | ORAL | Status: DC | PRN

## 2013-03-29 MED ORDER — FENTANYL CITRATE 0.05 MG/ML IJ SOLN
INTRAMUSCULAR | Status: AC
  Filled 2013-03-29: qty 2

## 2013-03-29 MED ORDER — VENLAFAXINE HCL 75 MG PO TABS
75.0000 mg | ORAL_TABLET | Freq: Every day | ORAL | Status: DC
  Administered 2013-03-29 – 2013-03-30 (×2): 75 mg via ORAL
  Filled 2013-03-29 (×2): qty 1

## 2013-03-29 MED ORDER — PROPOFOL 10 MG/ML IV BOLUS
INTRAVENOUS | Status: DC | PRN
  Administered 2013-03-29: 200 mg via INTRAVENOUS

## 2013-03-29 MED ORDER — IBUPROFEN 200 MG PO TABS: 200.0000 mg | ORAL_TABLET | Freq: Four times a day (QID) | ORAL | Status: DC | PRN

## 2013-03-29 MED ORDER — EPHEDRINE SULFATE 50 MG/ML IJ SOLN
INTRAMUSCULAR | Status: DC | PRN
  Administered 2013-03-29: 15 mg via INTRAVENOUS
  Administered 2013-03-29: 10 mg via INTRAVENOUS
  Administered 2013-03-29: 15 mg via INTRAVENOUS

## 2013-03-29 MED ORDER — LIDOCAINE HCL (CARDIAC) 20 MG/ML IV SOLN
INTRAVENOUS | Status: DC | PRN
  Administered 2013-03-29: 80 mg via INTRAVENOUS

## 2013-03-29 MED ORDER — 0.9 % SODIUM CHLORIDE (POUR BTL) OPTIME
TOPICAL | Status: DC | PRN
  Administered 2013-03-29: 1000 mL

## 2013-03-29 MED ORDER — LACTATED RINGERS IV SOLN: INTRAVENOUS | Status: DC

## 2013-03-29 MED ORDER — HYDROCODONE-ACETAMINOPHEN 5-325 MG PO TABS
1.0000 | ORAL_TABLET | ORAL | Status: DC | PRN
  Administered 2013-03-29 – 2013-03-30 (×5): 2 via ORAL
  Filled 2013-03-29 (×5): qty 2

## 2013-03-29 MED ORDER — OXYCODONE HCL 5 MG/5ML PO SOLN
5.0000 mg | Freq: Once | ORAL | Status: DC | PRN
Start: 2013-03-29 — End: 2013-03-29

## 2013-03-29 MED ORDER — HYDROXYZINE HCL 10 MG PO TABS
10.0000 mg | ORAL_TABLET | Freq: Two times a day (BID) | ORAL | Status: DC
  Administered 2013-03-30: 10 mg via ORAL
  Filled 2013-03-29 (×3): qty 1

## 2013-03-29 MED ORDER — LIDOCAINE-EPINEPHRINE 1 %-1:100000 IJ SOLN
INTRAMUSCULAR | Status: AC
  Filled 2013-03-29: qty 1

## 2013-03-29 MED ORDER — PHENYLEPHRINE HCL 10 MG/ML IJ SOLN
INTRAMUSCULAR | Status: DC | PRN
  Administered 2013-03-29 (×2): 120 ug via INTRAVENOUS
  Administered 2013-03-29: 80 ug via INTRAVENOUS

## 2013-03-29 MED ORDER — LIDOCAINE-EPINEPHRINE 1 %-1:100000 IJ SOLN
INTRAMUSCULAR | Status: DC | PRN
  Administered 2013-03-29: 20 mL

## 2013-03-29 MED ORDER — HYDROCODONE-ACETAMINOPHEN 7.5-325 MG PO TABS: 1.0000 | ORAL_TABLET | Freq: Four times a day (QID) | ORAL | Status: DC | PRN

## 2013-03-29 MED ORDER — ONDANSETRON HCL 4 MG/2ML IJ SOLN: 4.0000 mg | Freq: Four times a day (QID) | INTRAMUSCULAR | Status: DC | PRN

## 2013-03-29 MED ORDER — OXYCODONE HCL 5 MG PO TABS: 5.0000 mg | ORAL_TABLET | Freq: Once | ORAL | Status: DC | PRN

## 2013-03-29 MED ORDER — VANCOMYCIN HCL 10 G IV SOLR
1500.0000 mg | INTRAVENOUS | Status: AC
  Administered 2013-03-29: 1250 mg via INTRAVENOUS
  Filled 2013-03-29: qty 1500

## 2013-03-29 MED ORDER — PROMETHAZINE HCL 25 MG RE SUPP: 25.0000 mg | Freq: Four times a day (QID) | RECTAL | Status: DC | PRN

## 2013-03-29 MED ORDER — LACTATED RINGERS IV SOLN
INTRAVENOUS | Status: DC | PRN
  Administered 2013-03-29 (×2): via INTRAVENOUS

## 2013-03-29 MED ORDER — CLINDAMYCIN PALMITATE HCL 75 MG/5ML PO SOLR
300.0000 mg | Freq: Three times a day (TID) | ORAL | Status: DC
  Administered 2013-03-29 – 2013-03-30 (×3): 300 mg via ORAL
  Filled 2013-03-29 (×7): qty 20

## 2013-03-29 SURGICAL SUPPLY — 43 items
BLADE SURG 10 STRL SS (BLADE) ×2 IMPLANT
BLADE SURG 15 STRL LF DISP TIS (BLADE) ×1 IMPLANT
BLADE SURG 15 STRL SS (BLADE) ×1
BUR CROSS CUT FISSURE 1.6 (BURR) IMPLANT
CANISTER SUCTION 2500CC (MISCELLANEOUS) ×2 IMPLANT
CLEANER TIP ELECTROSURG 2X2 (MISCELLANEOUS) ×2 IMPLANT
CLOTH BEACON ORANGE TIMEOUT ST (SAFETY) ×2 IMPLANT
CORDS BIPOLAR (ELECTRODE) ×2 IMPLANT
COVER MAYO STAND STRL (DRAPES) ×2 IMPLANT
COVER SURGICAL LIGHT HANDLE (MISCELLANEOUS) ×2 IMPLANT
DERMABOND ADVANCED (GAUZE/BANDAGES/DRESSINGS) ×1
DERMABOND ADVANCED .7 DNX12 (GAUZE/BANDAGES/DRESSINGS) ×1 IMPLANT
ELECT COATED BLADE 2.86 ST (ELECTRODE) ×2 IMPLANT
ELECT NEEDLE TIP 2.8 STRL (NEEDLE) IMPLANT
ELECT REM PT RETURN 9FT ADLT (ELECTROSURGICAL) ×2
ELECTRODE REM PT RTRN 9FT ADLT (ELECTROSURGICAL) ×1 IMPLANT
GAUZE SPONGE 2X2 8PLY STRL LF (GAUZE/BANDAGES/DRESSINGS) ×1 IMPLANT
GLOVE ECLIPSE 7.5 STRL STRAW (GLOVE) ×2 IMPLANT
GLOVE SURG SS PI 7.0 STRL IVOR (GLOVE) ×2 IMPLANT
GLOVE SURG SS PI 7.5 STRL IVOR (GLOVE) ×4 IMPLANT
GOWN STRL NON-REIN LRG LVL3 (GOWN DISPOSABLE) ×4 IMPLANT
KIT BASIN OR (CUSTOM PROCEDURE TRAY) ×2 IMPLANT
KIT ROOM TURNOVER OR (KITS) ×2 IMPLANT
NEEDLE 25GX 5/8IN NON SAFETY (NEEDLE) ×2 IMPLANT
NEEDLE 27GAX1X1/2 (NEEDLE) ×2 IMPLANT
NS IRRIG 1000ML POUR BTL (IV SOLUTION) ×2 IMPLANT
PAD ARMBOARD 7.5X6 YLW CONV (MISCELLANEOUS) ×4 IMPLANT
PATTIES SURGICAL .5 X1 (DISPOSABLE) IMPLANT
PENCIL FOOT CONTROL (ELECTRODE) ×2 IMPLANT
RUBBERBAND STERILE (MISCELLANEOUS) ×4 IMPLANT
SPONGE GAUZE 2X2 STER 10/PKG (GAUZE/BANDAGES/DRESSINGS) ×1
SPONGE GAUZE 4X4 12PLY (GAUZE/BANDAGES/DRESSINGS) IMPLANT
SUT CHROMIC 3 0 CT 1 27 (SUTURE) ×2 IMPLANT
SUT CHROMIC 3 0 SH 27 (SUTURE) IMPLANT
SUT ETHILON 4 0 PS 2 18 (SUTURE) IMPLANT
SUT ETHILON 5 0 P 3 18 (SUTURE)
SUT NYLON ETHILON 5-0 P-3 1X18 (SUTURE) IMPLANT
SUT VIC AB 3-0 SH 27 (SUTURE)
SUT VIC AB 3-0 SH 27XBRD (SUTURE) IMPLANT
TOWEL OR 17X24 6PK STRL BLUE (TOWEL DISPOSABLE) ×2 IMPLANT
TOWEL OR 17X26 10 PK STRL BLUE (TOWEL DISPOSABLE) ×2 IMPLANT
TRAY ENT MC OR (CUSTOM PROCEDURE TRAY) ×2 IMPLANT
WATER STERILE IRR 1000ML POUR (IV SOLUTION) ×2 IMPLANT

## 2013-03-29 NOTE — Progress Notes (Signed)
Patient ID: Tammy Wilkinson, female   DOB: [birth year], 27 y.o.   MRN: [id number] Doing well after surgery.  Complains mainly of pain in the throat.  Swallowing hurts but is working well.  The voice seems stronger to her compared to preoperatively. AFVSS Alert, NAD. Voice fairly good. Incision clean with rubber band in place centrally.  Mild bloody drainage. Dressing changed. A/P: s/p medialization laryngoplasty Observe overnight.  Plan to remove drain in am and discharge home.

## 2013-03-29 NOTE — Interval H&P Note (Signed)
History and Physical Interval Note:  03/29/2013 9:31 AM  Tammy Wilkinson  has presented today for surgery, with the diagnosis of LEFT VOCAL CORD PARALYSIS  The various methods of treatment have been discussed with the patient and family. After consideration of risks, benefits and other options for treatment, the patient has consented to  Procedure(s): VOCAL CORD MEDIALIZATION (Left) as a surgical intervention .  The patient's history has been reviewed, patient examined, no change in status, stable for surgery.  I have reviewed the patient's chart and labs.  Questions were answered to the patient's satisfaction.     [name]

## 2013-03-29 NOTE — Anesthesia Preprocedure Evaluation (Signed)
Anesthesia Evaluation  Patient identified by MRN, date of birth, ID band Patient awake    Reviewed: Allergy & Precautions, H&P , NPO status , Patient's Chart, lab work & pertinent test results  Airway Mallampati: II  Neck ROM: full    Dental   Pulmonary shortness of breath, asthma , Current Smoker,          Cardiovascular     Neuro/Psych  Headaches, Anxiety Depression    GI/Hepatic GERD-  ,  Endo/Other    Renal/GU      Musculoskeletal   Abdominal   Peds  Hematology   Anesthesia Other Findings   Reproductive/Obstetrics                           Anesthesia Physical Anesthesia Plan  ASA: II  Anesthesia Plan: General   Post-op Pain Management:    Induction: Intravenous  Airway Management Planned: Oral ETT  Additional Equipment:   Intra-op Plan:   Post-operative Plan: Extubation in OR  Informed Consent: I have reviewed the patients History and Physical, chart, labs and discussed the procedure including the risks, benefits and alternatives for the proposed anesthesia with the patient or authorized representative who has indicated his/her understanding and acceptance.     Plan Discussed with: CRNA, Anesthesiologist and Surgeon  Anesthesia Plan Comments:         Anesthesia Quick Evaluation

## 2013-03-29 NOTE — Transfer of Care (Signed)
Immediate Anesthesia Transfer of Care Note  Patient: Tammy Wilkinson  Procedure(s) Performed: Procedure(s) with comments: VOCAL CORD MEDIALIZATION (Left) - with placement of silastic block  Patient Location: PACU  Anesthesia Type:General  Level of Consciousness: awake, alert  and oriented  Airway & Oxygen Therapy: Patient Spontanous Breathing and Patient connected to nasal cannula oxygen  Post-op Assessment: Report given to PACU RN, Post -op Vital signs reviewed and stable and Patient moving all extremities X 4  Post vital signs: Reviewed and stable  Complications: No apparent anesthesia complications

## 2013-03-29 NOTE — Op Note (Signed)
OPERATIVE REPORT  DATE OF SURGERY: 03/29/2013  PATIENT:  Tammy Wilkinson,  28 y.o. female  PRE-OPERATIVE DIAGNOSIS:  LEFT VOCAL CORD PARALYSIS  POST-OPERATIVE DIAGNOSIS:  LEFT VOCAL CORD PARALYSIS  PROCEDURE:  Procedure(s): VOCAL CORD MEDIALIZATION  SURGEON:  Susy Frizzle, MD  ASSISTANTS: none  ANESTHESIA:   General   EBL:  10 ml  DRAINS: Sterile rubber band  LOCAL MEDICATIONS USED:  1% Xylocaine with epinephrine  SPECIMEN:  none  COUNTS:  Correct  PROCEDURE DETAILS: The patient was taken to the operating room and placed on the operating table in the supine position. Following induction of general endotracheal anesthesia using laryngeal mask airway the fiberoptic scope was passed through the suction port of the LMA and the vocal cords were nicely visualized. The scope was then stabilized with tape. The neck was then prepped and draped in a standard fashion. 1% Xylocaine with epinephrine was infiltrated into the proposed incision which was in a horizontal skin crease just overlying the thyroid cartilage. 15 scalpel was used to incise the skin and subcutaneous tissue. Electrocautery was used to dissected down to the diastases of the strap muscles which were reflected off the left side. The thyroid cartilage was exposed. Periosteum was cleaned off. The cartilaginous window was created proximally to 3 mm posterior to the midline, 5 mm above the inferior margin, and a total of about 1 cm from anterior to posterior and half centimeter from superior to inferior. Cartilage was soft and was removed completely using a scalpel and Therapist, nutritional. The inner periosteum was elevated using a Public house manager. Using direct visualization with the fiberoptic scope and was able to visualize the location of the true cord. Silastic was then cut to size and shape and inserted into the parapharyngeal space giving a nice new positioning of the left cord. The wound was irrigated with saline. The wound was  closed in layers using 3-0 chromic suture. A rubber band was left in the wound and exited through the center of the incision. Dermabond was used on the skin. Dressing was applied. Patient was awakened extubated and transferred to recovery in stable condition.    PATIENT DISPOSITION:  To PACU, stable

## 2013-03-29 NOTE — Anesthesia Postprocedure Evaluation (Signed)
Anesthesia Post Note  Patient: Tammy Wilkinson  Procedure(s) Performed: Procedure(s) (LRB): VOCAL CORD MEDIALIZATION (Left)  Anesthesia type: General  Patient location: PACU  Post pain: Pain level controlled and Adequate analgesia  Post assessment: Post-op Vital signs reviewed, Patient's Cardiovascular Status Stable, Respiratory Function Stable, Patent Airway and Pain level controlled  Last Vitals:  Filed Vitals:   03/29/13 1232  BP:   Pulse:   Temp: 36.9 C  Resp:     Post vital signs: Reviewed and stable  Level of consciousness: awake, alert  and oriented  Complications: No apparent anesthesia complications

## 2013-03-29 NOTE — Preoperative (Signed)
Beta Blockers   Reason not to administer Beta Blockers:Not Applicable 

## 2013-03-30 MED ORDER — HYDROCODONE-ACETAMINOPHEN 5-325 MG PO TABS: 1.0000 | ORAL_TABLET | ORAL | Status: DC | PRN

## 2013-03-30 NOTE — Progress Notes (Signed)
Discharge instructions gone over with patient. Prescriptions given. Home medications gone over. Pneumonvac vaccine given. Diet, activity, and reasons to call the doctor gone over.Incisional care gone over. Patient verbalized understanding of care and instructions.

## 2013-03-30 NOTE — Discharge Summary (Signed)
Physician Discharge Summary  Patient ID: Tammy Wilkinson MRN: [id number] DOB/AGE: November 14, 1985 27 y.o.  Admit date: 03/29/2013 Discharge date: 03/30/2013  Admission Diagnoses: Vocal fold paralysis  Discharge Diagnoses:  Same  Discharged Condition: good  Hospital Course: 27 year old female underwent medialization laryngoplasty for vocal fold paralysis.  Observed overnight without problems.  Rubber band drain removed POD1 and patient felt stable for discharge home.  Consults: None  Significant Diagnostic Studies: None  Treatments: surgery: Medialization laryngoplasty  Discharge Exam: Blood pressure 101/55, pulse 77, temperature 97.8 F (36.6 C), temperature source Oral, resp. rate 16, height 5\' 3"  (1.6 m), weight 68.3 kg (150 lb 9.2 oz), last menstrual period 03/09/2013, SpO2 99.00%. General appearance: alert, cooperative, no distress and fairly strong voice, not breathy, somewhat hoarse Neck: neck incision clean without fluid collection, mild drainage, rubber band drain removed, dressing reapplied  Disposition: 01-Home or Self Care  Discharge Orders   Future Orders Complete By Expires   Diet - low sodium heart healthy  As directed    Discharge instructions  As directed    Comments:     Gentle voice only.  Keep incision dry until drainage stops.  Change dry dressing as needed.  OK to allow incision to get wet thereafter, gently pat dry.  Call with signs of infection: worsening redness, pus draining, fever.   Increase activity slowly  As directed        Medication List         amphetamine-dextroamphetamine 10 MG tablet  Commonly known as:  ADDERALL  Take 20 mg by mouth 2 (two) times daily.     clindamycin 300 MG capsule  Commonly known as:  CLEOCIN  Take 1 capsule (300 mg total) by mouth 3 (three) times daily.     diphenhydrAMINE 25 mg capsule  Commonly known as:  BENADRYL  Take 25 mg by mouth every 6 (six) hours as needed for itching.     HYDROcodone-acetaminophen  7.5-325 MG per tablet  Commonly known as:  NORCO  Take 1 tablet by mouth every 6 (six) hours as needed for pain.     HYDROcodone-acetaminophen 5-325 MG per tablet  Commonly known as:  NORCO/VICODIN  Take 1-2 tablets by mouth every 4 (four) hours as needed.     hydrOXYzine 10 MG tablet  Commonly known as:  ATARAX/VISTARIL  Take 10 mg by mouth 2 (two) times daily with breakfast and lunch.     ibuprofen 200 MG tablet  Commonly known as:  ADVIL,MOTRIN  Take 200 mg by mouth every 6 (six) hours as needed for pain.     JUNEL 1.5/30 1.5-30 MG-MCG tablet  Generic drug:  Norethindrone Acetate-Ethinyl Estradiol  Take 1 tablet by mouth daily.     prazosin 1 MG capsule  Commonly known as:  MINIPRESS  Take 1 mg by mouth at bedtime.     promethazine 25 MG suppository  Commonly known as:  PHENERGAN  Place 1 suppository (25 mg total) rectally every 6 (six) hours as needed for nausea.     venlafaxine 75 MG tablet  Commonly known as:  EFFEXOR  Take 75 mg by mouth daily.           Follow-up Information   Follow up with Serena Colonel, MD. Schedule an appointment as soon as possible for a visit in 1 week.   Specialty:  Otolaryngology   Contact information:   [address] [place] [address] [place] [address] [phone]  SignedJenne Pane, Zeya Balles 03/30/2013, 9:56 AM

## 2013-04-02 ENCOUNTER — Encounter (HOSPITAL_COMMUNITY): Payer: Self-pay | Admitting: Otolaryngology

## 2013-04-22 ENCOUNTER — Other Ambulatory Visit: Payer: Self-pay | Admitting: Otolaryngology

## 2013-04-22 DIAGNOSIS — J3800 Paralysis of vocal cords and larynx, unspecified: Secondary | ICD-10-CM

## 2013-04-26 ENCOUNTER — Ambulatory Visit
Admission: RE | Admit: 2013-04-26 | Discharge: 2013-04-26 | Disposition: A | Discharge status: Home / Self Care | Payer: No Typology Code available for payment source | Source: Ambulatory Visit | Attending: Otolaryngology | Admitting: Otolaryngology

## 2013-04-26 DIAGNOSIS — J3800 Paralysis of vocal cords and larynx, unspecified: Secondary | ICD-10-CM

## 2013-04-26 MED ORDER — IOHEXOL 300 MG/ML  SOLN
75.0000 mL | Freq: Once | INTRAMUSCULAR | Status: AC | PRN
  Administered 2013-04-26: 75 mL via INTRAVENOUS

## 2013-05-23 ENCOUNTER — Other Ambulatory Visit: Payer: Self-pay

## 2013-07-18 NOTE — L&D Delivery Note (Signed)
Delivery Note At 1:00 AM a viable and healthy female was delivered via Vaginal, Spontaneous Delivery (Presentation: Left Occiput Anterior).  APGAR: 7, 9; weight .pending  Placenta status: Intact, Spontaneous.  Cord:  with the following complications: None.  Cord pH: n/a  Anesthesia: Epidural  Episiotomy: None Lacerations: Perineall;1st degree Suture Repair: 3.0 vicryl rapide Est. Blood Loss (mL): 250 cc Maternal fever in pushing, Tylenol, watch for PP fevers.   Mom to postpartum.  Baby to Couplet care / Skin to Skin.  Jaryah Aracena R 04/25/2014, 1:25 AM

## 2013-09-17 LAB — OB RESULTS CONSOLE PLATELET COUNT: PLATELETS: 351 10*3/uL

## 2013-09-17 LAB — OB RESULTS CONSOLE ANTIBODY SCREEN: Antibody Screen: NEGATIVE

## 2013-09-17 LAB — OB RESULTS CONSOLE RPR: RPR: NONREACTIVE

## 2013-09-17 LAB — OB RESULTS CONSOLE HIV ANTIBODY (ROUTINE TESTING): HIV: NONREACTIVE

## 2013-09-17 LAB — OB RESULTS CONSOLE HGB/HCT, BLOOD
HCT: 36 %
Hemoglobin: 12.4 g/dL

## 2013-09-17 LAB — OB RESULTS CONSOLE ABO/RH: RH Type: POSITIVE

## 2013-09-17 LAB — OB RESULTS CONSOLE RUBELLA ANTIBODY, IGM: Rubella: IMMUNE

## 2013-09-17 LAB — OB RESULTS CONSOLE HEPATITIS B SURFACE ANTIGEN: Hepatitis B Surface Ag: NEGATIVE

## 2013-10-02 LAB — OB RESULTS CONSOLE GC/CHLAMYDIA
Chlamydia: NEGATIVE
Gonorrhea: NEGATIVE

## 2014-01-11 ENCOUNTER — Inpatient Hospital Stay (HOSPITAL_COMMUNITY)
Admission: AD | Admit: 2014-01-11 | Discharge: 2014-01-11 | Disposition: A | Discharge status: Home / Self Care | Payer: No Typology Code available for payment source | Source: Ambulatory Visit | Attending: Obstetrics and Gynecology | Admitting: Obstetrics and Gynecology

## 2014-01-11 ENCOUNTER — Encounter (HOSPITAL_COMMUNITY): Payer: Self-pay | Admitting: *Deleted

## 2014-01-11 DIAGNOSIS — Z3689 Encounter for other specified antenatal screening: Secondary | ICD-10-CM

## 2014-01-11 DIAGNOSIS — O99340 Other mental disorders complicating pregnancy, unspecified trimester: Secondary | ICD-10-CM | POA: Insufficient documentation

## 2014-01-11 DIAGNOSIS — F419 Anxiety disorder, unspecified: Secondary | ICD-10-CM

## 2014-01-11 DIAGNOSIS — F411 Generalized anxiety disorder: Secondary | ICD-10-CM | POA: Insufficient documentation

## 2014-01-11 DIAGNOSIS — O99330 Smoking (tobacco) complicating pregnancy, unspecified trimester: Secondary | ICD-10-CM | POA: Insufficient documentation

## 2014-01-11 LAB — URINALYSIS, ROUTINE W REFLEX MICROSCOPIC
Bilirubin Urine: NEGATIVE
Glucose, UA: NEGATIVE mg/dL
Ketones, ur: NEGATIVE mg/dL
Leukocytes, UA: NEGATIVE
Nitrite: NEGATIVE
Protein, ur: NEGATIVE mg/dL
Specific Gravity, Urine: <1.005 — ABNORMAL LOW (ref 1.005–1.030)
Urobilinogen, UA: 0.2 mg/dL (ref 0.0–1.0)
pH: 5.5 (ref 5.0–8.0)

## 2014-01-11 LAB — URINE MICROSCOPIC-ADD ON

## 2014-01-11 NOTE — Discharge Instructions (Signed)
Dehydration, Adult °Dehydration is when you lose more fluids from the body than you take in. Vital organs like the kidneys, brain, and heart cannot function without a proper amount of fluids and salt. Any loss of fluids from the body can cause dehydration.  °CAUSES  °· Vomiting. °· Diarrhea. °· Excessive sweating. °· Excessive urine output. °· Fever. °SYMPTOMS  °Mild dehydration °· Thirst. °· Dry lips. °· Slightly dry mouth. °Moderate dehydration °· Very dry mouth. °· Sunken eyes. °· Skin does not bounce back quickly when lightly pinched and released. °· Dark urine and decreased urine production. °· Decreased tear production. °· Headache. °Severe dehydration °· Very dry mouth. °· Extreme thirst. °· Rapid, weak pulse (more than 100 beats per minute at rest). °· Cold hands and feet. °· Not able to sweat in spite of heat and temperature. °· Rapid breathing. °· Blue lips. °· Confusion and lethargy. °· Difficulty being awakened. °· Minimal urine production. °· No tears. °DIAGNOSIS  °Your caregiver will diagnose dehydration based on your symptoms and your exam. Blood and urine tests will help confirm the diagnosis. The diagnostic evaluation should also identify the cause of dehydration. °TREATMENT  °Treatment of mild or moderate dehydration can often be done at home by increasing the amount of fluids that you drink. It is best to drink small amounts of fluid more often. Drinking too much at one time can make vomiting worse. Refer to the home care instructions below. °Severe dehydration needs to be treated at the hospital where you will probably be given intravenous (IV) fluids that contain water and electrolytes. °HOME CARE INSTRUCTIONS  °· Ask your caregiver about specific rehydration instructions. °· Drink enough fluids to keep your urine clear or pale yellow. °· Drink small amounts frequently if you have nausea and vomiting. °· Eat as you normally do. °· Avoid: °¨ Foods or drinks high in sugar. °¨ Carbonated  drinks. °¨ Juice. °¨ Extremely hot or cold fluids. °¨ Drinks with caffeine. °¨ Fatty, greasy foods. °¨ Alcohol. °¨ Tobacco. °¨ Overeating. °¨ Gelatin desserts. °· Wash your hands well to avoid spreading bacteria and viruses. °· Only take over-the-counter or prescription medicines for pain, discomfort, or fever as directed by your caregiver. °· Ask your caregiver if you should continue all prescribed and over-the-counter medicines. °· Keep all follow-up appointments with your caregiver. °SEEK MEDICAL CARE IF: °· You have abdominal pain and it increases or stays in one area (localizes). °· You have a rash, stiff neck, or severe headache. °· You are irritable, sleepy, or difficult to awaken. °· You are weak, dizzy, or extremely thirsty. °SEEK IMMEDIATE MEDICAL CARE IF:  °· You are unable to keep fluids down or you get worse despite treatment. °· You have frequent episodes of vomiting or diarrhea. °· You have blood or green matter (bile) in your vomit. °· You have blood in your stool or your stool looks black and tarry. °· You have not urinated in 6 to 8 hours, or you have only urinated a small amount of very dark urine. °· You have a fever. °· You faint. °MAKE SURE YOU:  °· Understand these instructions. °· Will watch your condition. °· Will get help right away if you are not doing well or get worse. °Document Released: 07/04/2005 Document Revised: 09/26/2011 Document Reviewed: 02/21/2011 °ExitCare® Patient Information ©2015 [employer]. This information is not intended to replace advice given to you by your health care provider. Make sure you discuss any questions you have with your health care   provider. °Braxton Hicks Contractions °Contractions of the uterus can occur throughout pregnancy. Contractions are not always a sign that you are in labor.  °WHAT ARE BRAXTON HICKS CONTRACTIONS?  °Contractions that occur before labor are called Braxton Hicks contractions, or false labor. Toward the end of pregnancy (32-34  weeks), these contractions can develop more often and may become more forceful. This is not true labor because these contractions do not result in opening (dilatation) and thinning of the cervix. They are sometimes difficult to tell apart from true labor because these contractions can be forceful and people have different pain tolerances. You should not feel embarrassed if you go to the hospital with false labor. Sometimes, the only way to tell if you are in true labor is for your health care provider to look for changes in the cervix. °If there are no prenatal problems or other health problems associated with the pregnancy, it is completely safe to be sent home with false labor and await the onset of true labor. °HOW CAN YOU TELL THE DIFFERENCE BETWEEN TRUE AND FALSE LABOR? °False Labor °· The contractions of false labor are usually shorter and not as hard as those of true labor.   °· The contractions are usually irregular.   °· The contractions are often felt in the front of the lower abdomen and in the groin.   °· The contractions may go away when you walk around or change positions while lying down.   °· The contractions get weaker and are shorter lasting as time goes on.   °· The contractions do not usually become progressively stronger, regular, and closer together as with true labor.   °True Labor °· Contractions in true labor last 30-70 seconds, become very regular, usually become more intense, and increase in frequency.   °· The contractions do not go away with walking.   °· The discomfort is usually felt in the top of the uterus and spreads to the lower abdomen and low back.   °· True labor can be determined by your health care provider with an exam. This will show that the cervix is dilating and getting thinner.   °WHAT TO REMEMBER °· Keep up with your usual exercises and follow other instructions given by your health care provider.   °· Take medicines as directed by your health care provider.   °· Keep  your regular prenatal appointments.   °· Eat and drink lightly if you think you are going into labor.   °· If Braxton Hicks contractions are making you uncomfortable:   °¨ Change your position from lying down or resting to walking, or from walking to resting.   °¨ Sit and rest in a tub of warm water.   °¨ Drink 2-3 glasses of water. Dehydration may cause these contractions.   °¨ Do slow and deep breathing several times an hour.   °WHEN SHOULD I SEEK IMMEDIATE MEDICAL CARE? °Seek immediate medical care if: °· Your contractions become stronger, more regular, and closer together.   °· You have fluid leaking or gushing from your vagina.   °· You have a fever.   °· You pass blood-tinged mucus.   °· You have vaginal bleeding.   °· You have continuous abdominal pain.   °· You have low back pain that you never had before.   °· You feel your baby's head pushing down and causing pelvic pressure.   °· Your baby is not moving as much as it used to.   °Document Released: 07/04/2005 Document Revised: 07/09/2013 Document Reviewed: 04/15/2013 °ExitCare® Patient Information ©2015 [employer]. This information is not intended to replace advice given to you by your   health care provider. Make sure you discuss any questions you have with your health care provider. ° °

## 2014-01-11 NOTE — MAU Provider Note (Signed)
History     CSN: [id number]  Arrival date and time: 01/11/14 2026   First Provider Initiated Contact with Patient 01/11/14 2235      Chief Complaint  Patient presents with  . Abdominal Pain   HPI Comments: G2P1001 @24 .2 wks c/o ctx x2 days. Ctx increased frequency tonight around 1845, reporting 10-15 over 1.5 hrs, not painful but noticeable. No LOF or VB. Good FM. No recent intercourse. Admits to inadequate water intake.  H/o anxiety and PTSD. States "I think I let my mind get to me, and I'm going on vacation this week". Pregnancy uncomplicated.    OB History   Grav Para Term Preterm Abortions TAB SAB Ect Mult Living   2 1 1       1       Past Medical History  Diagnosis Date  . Anemia   . Anxiety   . Depression   . ADHD (attention deficit hyperactivity disorder)   . Allergy   . Asthma   . Shortness of breath   . Pneumonia   . Heartburn     hx:  . Headache(784.0)     Hx; of migraines    Past Surgical History  Procedure Laterality Date  . Wisdom tooth extraction    . Laryngoplasty Left 03/29/2013    Procedure: VOCAL CORD MEDIALIZATION;  Surgeon: Serena ColonelJefry Rosen, MD;  [facility];  Service: ENT;  Laterality: Left;  with placement of silastic block    Family History  Problem Relation Age of Onset  . Anesthesia problems Neg Hx   . Hypertension Mother   . Hypertension Father     History  Substance Use Topics  . Smoking status: Current Every Day Smoker -- 0.50 packs/day    Types: Cigarettes  . Smokeless tobacco: Never Used  . Alcohol Use: Yes     Comment: rare    Allergies:  Allergies  Allergen Reactions  . Latex Hives  . Amoxil [Amoxicillin] Other (See Comments)    Childhood reaction hallucinations  . Penicillins Other (See Comments)    Childhood reaction hallucinations    Prescriptions prior to admission  Medication Sig Dispense Refill  . busPIRone (BUSPAR) 5 MG tablet Take 5 mg by mouth 2 (two) times daily.      . cetirizine (ZYRTEC) 10 MG tablet  Take 10 mg by mouth daily.      . diphenhydrAMINE (BENADRYL) 25 mg capsule Take 25 mg by mouth every 6 (six) hours as needed for allergies.       . Prenatal Vit-Fe Fumarate-FA (PRENATAL MULTIVITAMIN) TABS tablet Take 1 tablet by mouth daily at 12 noon.        Review of Systems  Constitutional: Negative.   HENT: Negative.   Eyes: Negative.   Respiratory: Negative.   Cardiovascular: Negative.   Gastrointestinal: Negative.   Genitourinary: Negative.   Musculoskeletal: Negative.   Skin: Negative.   Neurological: Negative.   Endo/Heme/Allergies: Negative.   Psychiatric/Behavioral: The patient is nervous/anxious.    Physical Exam   Blood pressure 111/62, pulse 95, temperature 98.3 F (36.8 C), temperature source Oral, resp. rate 16, height 5\' 4"  (1.626 m), weight 77.111 kg (170 lb), last menstrual period 07/23/2013.  Physical Exam  Constitutional: She appears well-developed and well-nourished.  HENT:  Head: Normocephalic.  Neck: Normal range of motion. Neck supple.  Cardiovascular: Normal rate and regular rhythm.   Respiratory: Effort normal and breath sounds normal.  GI: Soft. Bowel sounds are normal. There is no tenderness.  gravid  Genitourinary:  Vagina normal and uterus normal.  Gravid SVE: 0/0/high  Musculoskeletal: Normal range of motion.  Neurological: She is alert.  Skin: Skin is warm and dry.  Psychiatric: She has a normal mood and affect.  EFM: 145 bpm, mod variability, +accels, no decels Toco: none NST reactive  Results for Donata ClayETI, Arwa M (MRN [id number]) as of 01/11/2014 22:53  Ref. Range 01/11/2014 20:50  Color, Urine Latest Range: YELLOW  YELLOW  APPearance Latest Range: CLEAR  CLEAR  Specific Gravity, Urine Latest Range: 1.005-1.030  <1.005 (L)  pH Latest Range: 5.0-8.0  5.5  Glucose Latest Range: NEGATIVE mg/dL NEGATIVE  Bilirubin Urine Latest Range: NEGATIVE  NEGATIVE  Ketones, ur Latest Range: NEGATIVE mg/dL NEGATIVE  Protein Latest Range: NEGATIVE  mg/dL NEGATIVE  Urobilinogen, UA Latest Range: 0.0-1.0 mg/dL 0.2  Nitrite Latest Range: NEGATIVE  NEGATIVE  Leukocytes, UA Latest Range: NEGATIVE  NEGATIVE  Hgb urine dipstick Latest Range: NEGATIVE  TRACE (A)  RBC / HPF Latest Range: <3 RBC/hpf 0-2  Squamous Epithelial / LPF Latest Range: RARE  RARE  Bacteria, UA Latest Range: RARE  RARE   MAU Course  Procedures   Assessment and Plan  24.[redacted] weeks gestation Anxiety Reactive NST  Discharge home No evidence of preterm ctx or preterm labor Maintain adequate hydration  Continue Buspar bid PTL precautions Keep next scheduled appt.   Donette LarryBHAMBRI, MELANIE, N MSN, CNM 01/11/2014, 10:48 PM

## 2014-01-11 NOTE — MAU Note (Signed)
Patient presents with complaint of uncomfortable contractions X 2 days.

## 2014-04-24 ENCOUNTER — Encounter (HOSPITAL_COMMUNITY): Payer: No Typology Code available for payment source | Admitting: Anesthesiology

## 2014-04-24 ENCOUNTER — Encounter (HOSPITAL_COMMUNITY): Payer: Self-pay | Admitting: *Deleted

## 2014-04-24 ENCOUNTER — Inpatient Hospital Stay (HOSPITAL_COMMUNITY)
Admission: AD | Admit: 2014-04-24 | Discharge: 2014-04-27 | DRG: 775 | Disposition: A | Discharge status: Home / Self Care | Payer: No Typology Code available for payment source | Source: Ambulatory Visit | Attending: Obstetrics & Gynecology | Admitting: Obstetrics & Gynecology

## 2014-04-24 ENCOUNTER — Inpatient Hospital Stay (HOSPITAL_COMMUNITY)
Admission: AD | Admit: 2014-04-24 | Discharge: 2014-04-24 | Disposition: A | Discharge status: Home / Self Care | Payer: No Typology Code available for payment source | Source: Ambulatory Visit | Attending: Obstetrics and Gynecology | Admitting: Obstetrics and Gynecology

## 2014-04-24 ENCOUNTER — Inpatient Hospital Stay (HOSPITAL_COMMUNITY): Payer: No Typology Code available for payment source | Admitting: Anesthesiology

## 2014-04-24 ENCOUNTER — Encounter (HOSPITAL_COMMUNITY): Payer: Self-pay

## 2014-04-24 DIAGNOSIS — Z3A39 39 weeks gestation of pregnancy: Secondary | ICD-10-CM | POA: Diagnosis present

## 2014-04-24 DIAGNOSIS — F418 Other specified anxiety disorders: Secondary | ICD-10-CM | POA: Diagnosis present

## 2014-04-24 DIAGNOSIS — O99824 Streptococcus B carrier state complicating childbirth: Secondary | ICD-10-CM | POA: Diagnosis present

## 2014-04-24 DIAGNOSIS — Z8249 Family history of ischemic heart disease and other diseases of the circulatory system: Secondary | ICD-10-CM | POA: Diagnosis not present

## 2014-04-24 DIAGNOSIS — O9989 Other specified diseases and conditions complicating pregnancy, childbirth and the puerperium: Secondary | ICD-10-CM | POA: Diagnosis present

## 2014-04-24 DIAGNOSIS — O99343 Other mental disorders complicating pregnancy, third trimester: Secondary | ICD-10-CM | POA: Diagnosis present

## 2014-04-24 DIAGNOSIS — O471 False labor at or after 37 completed weeks of gestation: Secondary | ICD-10-CM | POA: Insufficient documentation

## 2014-04-24 DIAGNOSIS — Z87891 Personal history of nicotine dependence: Secondary | ICD-10-CM | POA: Diagnosis not present

## 2014-04-24 DIAGNOSIS — F32A Depression, unspecified: Secondary | ICD-10-CM | POA: Diagnosis present

## 2014-04-24 DIAGNOSIS — F319 Bipolar disorder, unspecified: Secondary | ICD-10-CM | POA: Diagnosis present

## 2014-04-24 DIAGNOSIS — F329 Major depressive disorder, single episode, unspecified: Secondary | ICD-10-CM | POA: Diagnosis present

## 2014-04-24 DIAGNOSIS — IMO0001 Reserved for inherently not codable concepts without codable children: Secondary | ICD-10-CM

## 2014-04-24 DIAGNOSIS — F419 Anxiety disorder, unspecified: Secondary | ICD-10-CM

## 2014-04-24 LAB — RPR

## 2014-04-24 LAB — CBC
HCT: 36.7 % (ref 36.0–46.0)
Hemoglobin: 12.6 g/dL (ref 12.0–15.0)
MCH: 33 pg (ref 26.0–34.0)
MCHC: 34.3 g/dL (ref 30.0–36.0)
MCV: 96.1 fL (ref 78.0–100.0)
PLATELETS: 333 10*3/uL (ref 150–400)
RBC: 3.82 MIL/uL — ABNORMAL LOW (ref 3.87–5.11)
RDW: 13 % (ref 11.5–15.5)
WBC: 14.9 10*3/uL — AB (ref 4.0–10.5)

## 2014-04-24 LAB — OB RESULTS CONSOLE GBS: STREP GROUP B AG: POSITIVE

## 2014-04-24 MED ORDER — FENTANYL 2.5 MCG/ML BUPIVACAINE 1/10 % EPIDURAL INFUSION (WH - ANES)
14.0000 mL/h | INTRAMUSCULAR | Status: DC | PRN
  Administered 2014-04-24 (×2): 14 mL/h via EPIDURAL
  Filled 2014-04-24 (×2): qty 125

## 2014-04-24 MED ORDER — CEFAZOLIN SODIUM-DEXTROSE 2-3 GM-% IV SOLR: 2.0000 g | Freq: Once | INTRAVENOUS | Status: DC

## 2014-04-24 MED ORDER — OXYTOCIN 40 UNITS IN LACTATED RINGERS INFUSION - SIMPLE MED
1.0000 m[IU]/min | INTRAVENOUS | Status: DC
  Administered 2014-04-24: 1 m[IU]/min via INTRAVENOUS
  Filled 2014-04-24: qty 1000

## 2014-04-24 MED ORDER — PHENYLEPHRINE 40 MCG/ML (10ML) SYRINGE FOR IV PUSH (FOR BLOOD PRESSURE SUPPORT)
80.0000 ug | PREFILLED_SYRINGE | INTRAVENOUS | Status: DC | PRN
  Filled 2014-04-24: qty 2
  Filled 2014-04-24: qty 10

## 2014-04-24 MED ORDER — BUTORPHANOL TARTRATE 1 MG/ML IJ SOLN
1.0000 mg | INTRAMUSCULAR | Status: DC | PRN
  Administered 2014-04-24: 1 mg via INTRAVENOUS
  Filled 2014-04-24: qty 1

## 2014-04-24 MED ORDER — LIDOCAINE HCL (PF) 1 % IJ SOLN
30.0000 mL | INTRAMUSCULAR | Status: DC | PRN
  Administered 2014-04-25: 30 mL via SUBCUTANEOUS
  Filled 2014-04-24: qty 30

## 2014-04-24 MED ORDER — EPHEDRINE 5 MG/ML INJ
10.0000 mg | INTRAVENOUS | Status: DC | PRN
  Filled 2014-04-24: qty 2

## 2014-04-24 MED ORDER — FLEET ENEMA 7-19 GM/118ML RE ENEM: 1.0000 | ENEMA | RECTAL | Status: DC | PRN

## 2014-04-24 MED ORDER — TERBUTALINE SULFATE 1 MG/ML IJ SOLN: 0.2500 mg | Freq: Once | INTRAMUSCULAR | Status: AC | PRN

## 2014-04-24 MED ORDER — OXYTOCIN 40 UNITS IN LACTATED RINGERS INFUSION - SIMPLE MED: 62.5000 mL/h | INTRAVENOUS | Status: DC

## 2014-04-24 MED ORDER — PHENYLEPHRINE 40 MCG/ML (10ML) SYRINGE FOR IV PUSH (FOR BLOOD PRESSURE SUPPORT)
80.0000 ug | PREFILLED_SYRINGE | INTRAVENOUS | Status: DC | PRN
  Filled 2014-04-24: qty 2

## 2014-04-24 MED ORDER — LACTATED RINGERS IV SOLN
500.0000 mL | Freq: Once | INTRAVENOUS | Status: AC
  Administered 2014-04-24: 500 mL via INTRAVENOUS

## 2014-04-24 MED ORDER — DIPHENHYDRAMINE HCL 50 MG/ML IJ SOLN: 12.5000 mg | INTRAMUSCULAR | Status: DC | PRN

## 2014-04-24 MED ORDER — LIDOCAINE HCL (PF) 1 % IJ SOLN
INTRAMUSCULAR | Status: DC | PRN
  Administered 2014-04-24 (×2): 5 mL

## 2014-04-24 MED ORDER — LACTATED RINGERS IV SOLN
INTRAVENOUS | Status: DC
  Administered 2014-04-24 (×2): via INTRAVENOUS

## 2014-04-24 MED ORDER — ACETAMINOPHEN 325 MG PO TABS
650.0000 mg | ORAL_TABLET | ORAL | Status: DC | PRN
  Administered 2014-04-24 – 2014-04-25 (×2): 650 mg via ORAL
  Filled 2014-04-24 (×2): qty 2

## 2014-04-24 MED ORDER — OXYTOCIN BOLUS FROM INFUSION
500.0000 mL | INTRAVENOUS | Status: DC
  Administered 2014-04-25: 500 mL via INTRAVENOUS

## 2014-04-24 MED ORDER — LACTATED RINGERS IV SOLN
500.0000 mL | INTRAVENOUS | Status: DC | PRN
  Administered 2014-04-24: 300 mL via INTRAVENOUS
  Administered 2014-04-24 – 2014-04-25 (×2): 500 mL via INTRAVENOUS

## 2014-04-24 MED ORDER — INFLUENZA VAC SPLIT QUAD 0.5 ML IM SUSY
0.5000 mL | PREFILLED_SYRINGE | INTRAMUSCULAR | Status: DC
  Filled 2014-04-24: qty 0.5

## 2014-04-24 MED ORDER — CLINDAMYCIN PHOSPHATE 900 MG/50ML IV SOLN
900.0000 mg | Freq: Three times a day (TID) | INTRAVENOUS | Status: DC
  Administered 2014-04-24 (×2): 900 mg via INTRAVENOUS
  Filled 2014-04-24 (×4): qty 50

## 2014-04-24 MED ORDER — CITRIC ACID-SODIUM CITRATE 334-500 MG/5ML PO SOLN: 30.0000 mL | ORAL | Status: DC | PRN

## 2014-04-24 MED ORDER — OXYCODONE-ACETAMINOPHEN 5-325 MG PO TABS: 2.0000 | ORAL_TABLET | ORAL | Status: DC | PRN

## 2014-04-24 MED ORDER — OXYCODONE-ACETAMINOPHEN 5-325 MG PO TABS: 1.0000 | ORAL_TABLET | ORAL | Status: DC | PRN

## 2014-04-24 MED ORDER — ONDANSETRON HCL 4 MG/2ML IJ SOLN
4.0000 mg | Freq: Four times a day (QID) | INTRAMUSCULAR | Status: DC | PRN
Start: 2014-04-24 — End: 2014-04-25

## 2014-04-24 MED ORDER — CEFAZOLIN SODIUM 1-5 GM-% IV SOLN: 1.0000 g | Freq: Three times a day (TID) | INTRAVENOUS | Status: DC

## 2014-04-24 NOTE — MAU Note (Signed)
Pt was seen in MAU this morning and was discharged.  Saw Dr. Juliene PinaMody in office about 0930 and was told she was 2cm dilated to go home, eat, and then come to MAU for induction of labor.  Pt states U/C's are every 2-3 minutes a part.  Some bloody show.  Denies ROM.  Good fetal movement.

## 2014-04-24 NOTE — Progress Notes (Signed)
Patient ID: Tammy RiderCristina Habenicht, female   DOB: [birth year], 28 y.o.   MRN: [id number] Epidural, pitocin, Clindamycin, VS stable, progressing well, except descent and SVD. FHT- 150s (change in baseline) but moderate variability- no decels- category I

## 2014-04-24 NOTE — MAU Note (Signed)
Patient states she is having more frequent, painful contractions and bloody show.

## 2014-04-24 NOTE — MAU Note (Signed)
Called Heather, charge RN in birthing suites to give report on pt admission.  Unable to take pt at this time and will need to call back with bed assignment.

## 2014-04-24 NOTE — Plan of Care (Signed)
Problem: Consults Goal: Birthing Suites Patient Information Press F2 to bring up selections list  Outcome: Completed/Met Date Met:  04/24/14  Pt 37-[redacted] weeks EGA

## 2014-04-24 NOTE — MAU Note (Signed)
Pt reports contractions q 3-5 minutes x 3 hours. Denies bleeding or ROM

## 2014-04-24 NOTE — Progress Notes (Signed)
04/24/14 1700  Clinical Encounter Type  Visited With Patient and family together  Visit Type Initial  Referral From Family (mom Shelly Rizzolla)  Spiritual Encounters  Spiritual Needs Emotional  Stress Factors  Patient Stress Factors (hx loss (sister to suicide))   Acquainted with Crissy and referred by her mom, who goes by BranchShelly.  At this time, pt's focus is on active labor, but I am also aware of her history of loss.  Per pt's mom, they received bereavement counseling together through [facility].  Provided pastoral presence, reflective listening, encouragement.  Plan to f/u tomorrow pm, but please also page as needs arise:  [phone].  Thank you.  [address]Chaplain Allayah Raineri JewettLundeen, [place] [phone]

## 2014-04-24 NOTE — Anesthesia Preprocedure Evaluation (Signed)
Anesthesia Evaluation  Patient identified by MRN, date of birth, ID band Patient awake    Reviewed: Allergy & Precautions, H&P , Patient's Chart, lab work & pertinent test results  Airway Mallampati: II TM Distance: >3 FB Neck ROM: full    Dental   Pulmonary shortness of breath, asthma , pneumonia -, former smoker,  breath sounds clear to auscultation        Cardiovascular Rhythm:regular Rate:Normal     Neuro/Psych  Headaches, PSYCHIATRIC DISORDERS    GI/Hepatic   Endo/Other    Renal/GU      Musculoskeletal   Abdominal   Peds  Hematology  (+) anemia ,   Anesthesia Other Findings   Reproductive/Obstetrics (+) Pregnancy                           Anesthesia Physical Anesthesia Plan  ASA: II  Anesthesia Plan: Epidural   Post-op Pain Management:    Induction:   Airway Management Planned:   Additional Equipment:   Intra-op Plan:   Post-operative Plan:   Informed Consent: I have reviewed the patients History and Physical, chart, labs and discussed the procedure including the risks, benefits and alternatives for the proposed anesthesia with the patient or authorized representative who has indicated his/her understanding and acceptance.     Plan Discussed with:   Anesthesia Plan Comments:         Anesthesia Quick Evaluation

## 2014-04-24 NOTE — H&P (Signed)
Tammy Wilkinson is a 28 y.o. female presenting at 39.2 wks in early labor, not tolerating pain well. Pt was seen in MAU earlier this morning and then walked to office for pain and exam noted cervix at 2 cm. She ambulated for a while and presented back to MAU where RN exam was 3 to 4 cm, pain ++, requesting pain meds. Pt has used daily Percocet bid in this preg since 22 wks and has severe back pain and poor pain tolerance.   [facility] 8 [facility]. Has Anxiety/Depression, PTDS (after sister's suicide), back pain with failed injections in the back and now on Percocet since 22 wks. Stopped Effexor with positive pregnancy, switched to Buspar bid, stopped smoking 2 wks in pregnancy.  VZ- non-immune.  Prior term SVD 7+ lbs.   History OB History   Grav Para Term Preterm Abortions TAB SAB Ect Mult Living   2 1 1       1      Past Medical History  Diagnosis Date  . Anemia   . Anxiety   . Depression   . ADHD (attention deficit hyperactivity disorder)   . Allergy   . Shortness of breath   . Pneumonia   . Heartburn     hx:  . Headache(784.0)     Hx; of migraines  . Asthma     pt states when she was a child   Past Surgical History  Procedure Laterality Date  . Wisdom tooth extraction    . Laryngoplasty Left 03/29/2013    Procedure: VOCAL CORD MEDIALIZATION;  Surgeon: Serena Colonel, MD;  Location[facility] OR;  Service: ENT;  Laterality: Left;  with placement of silastic block   Family History: family history includes Hypertension in her father and mother. There is no history of Anesthesia problems. Social History:  reports that she quit smoking about 7 months ago. Her smoking use included Cigarettes. She smoked 0.50 packs per day. She has never used smokeless tobacco. She reports that she drinks alcohol. She reports that she does not use illicit drugs.   Prenatal Transfer Tool  Maternal Diabetes: No (passed 3 hr GTT) Genetic Screening: Normal  AFP4 negative Maternal Ultrasounds/Referrals:  Normal Fetal Ultrasounds or other Referrals:  None Maternal Substance Abuse:  No Stopped smoking in 1st trim Significant Maternal Medications:  Meds include: Other:  Effexor stopped at 8-10 wks, Buspar bid, Percocet bid (since 22 wks) Significant Maternal Lab Results:  Lab values include: Group B Strep positive Other Comments:  None  ROS  Dilation: 4 Effacement (%): 90 Station: -2 Exam by:: L. Paschal, RN Blood pressure 129/96, pulse 79, temperature 98.1 F (36.7 C), temperature source Oral, resp. rate 18, height 5\' 4"  (1.626 m), weight 180 lb (81.647 kg), last menstrual period 07/23/2013, SpO2 98.00%. Exam Physical Exam  Physical exam:  A&O x 3, no acute distress. Pleasant HEENT neg, no thyromegaly Lungs CTA bilat CV RRR, A1S2 normal Abdo soft, non tender, non acute Extr no edema/ tenderness Pelvic above  FHT 140s/ category I Toco q 4 min, spont labor   Prenatal labs: ABO, Rh: A/Positive/-- (03/03 0000) Antibody: Negative (03/03 0000) Rubella:   immune RPR: Nonreactive (03/03 0000)  HBsAg: Negative (03/03 0000)  HIV: Non-reactive (03/03 0000)  GBS: Positive (10/08 0000)  3hr GTT passed  Assessment/Plan: 28 yo, G2P1001, at 39.2 wks, in active labor. GBS(+), Clindamycin per protocol.  Plan AROM, pitocin as needed. Pain management postpartum. Inform Peds at birth of maternal Percocet use. PTSD/anxiety hx, at  risk for pp Depression.  \EFW 8 lbs   Daleon Willinger R 04/24/2014, 3:51 PM

## 2014-04-24 NOTE — Anesthesia Procedure Notes (Signed)
Epidural Patient location during procedure: OB Start time: 04/24/2014 3:14 PM  Staffing Anesthesiologist: Brayton CavesJACKSON, Kem Hensen Performed by: anesthesiologist   Preanesthetic Checklist Completed: patient identified, site marked, surgical consent, pre-op evaluation, timeout performed, IV checked, risks and benefits discussed and monitors and equipment checked  Epidural Patient position: sitting Prep: site prepped and draped and DuraPrep Patient monitoring: continuous pulse ox and blood pressure Approach: midline Location: L3-L4 Injection technique: LOR air  Needle:  Needle type: Tuohy  Needle gauge: 17 G Needle length: 9 cm and 9 Needle insertion depth: 5 cm cm Catheter type: closed end flexible Catheter size: 19 Gauge Catheter at skin depth: 10 cm Test dose: negative  Assessment Events: blood not aspirated, injection not painful, no injection resistance, negative IV test and no paresthesia  Additional Notes Patient identified.  Risk benefits discussed including failed block, incomplete pain control, headache, nerve damage, paralysis, blood pressure changes, nausea, vomiting, reactions to medication both toxic or allergic, and postpartum back pain.  Patient expressed understanding and wished to proceed.  All questions were answered.  Sterile technique used throughout procedure and epidural site dressed with sterile barrier dressing. No paresthesia or other complications noted.The patient did not experience any signs of intravascular injection such as tinnitus or metallic taste in mouth nor signs of intrathecal spread such as rapid motor block. Please see nursing notes for vital signs.

## 2014-04-25 ENCOUNTER — Encounter (HOSPITAL_COMMUNITY): Payer: Self-pay | Admitting: *Deleted

## 2014-04-25 LAB — CBC
HEMATOCRIT: 33.8 % — AB (ref 36.0–46.0)
HEMOGLOBIN: 11.4 g/dL — AB (ref 12.0–15.0)
MCH: 32.3 pg (ref 26.0–34.0)
MCHC: 33.7 g/dL (ref 30.0–36.0)
MCV: 95.8 fL (ref 78.0–100.0)
Platelets: 276 10*3/uL (ref 150–400)
RBC: 3.53 MIL/uL — ABNORMAL LOW (ref 3.87–5.11)
RDW: 13 % (ref 11.5–15.5)
WBC: 22.8 10*3/uL — ABNORMAL HIGH (ref 4.0–10.5)

## 2014-04-25 MED ORDER — ONDANSETRON HCL 4 MG PO TABS: 4.0000 mg | ORAL_TABLET | ORAL | Status: DC | PRN

## 2014-04-25 MED ORDER — LORATADINE 10 MG PO TABS
10.0000 mg | ORAL_TABLET | Freq: Every day | ORAL | Status: DC
  Administered 2014-04-25 – 2014-04-27 (×3): 10 mg via ORAL
  Filled 2014-04-25 (×3): qty 1

## 2014-04-25 MED ORDER — ZOLPIDEM TARTRATE 5 MG PO TABS: 5.0000 mg | ORAL_TABLET | Freq: Every evening | ORAL | Status: DC | PRN

## 2014-04-25 MED ORDER — OXYCODONE-ACETAMINOPHEN 5-325 MG PO TABS
1.0000 | ORAL_TABLET | ORAL | Status: DC | PRN
  Administered 2014-04-25 – 2014-04-27 (×11): 1 via ORAL
  Filled 2014-04-25 (×11): qty 1

## 2014-04-25 MED ORDER — ONDANSETRON HCL 4 MG/2ML IJ SOLN: 4.0000 mg | INTRAMUSCULAR | Status: DC | PRN

## 2014-04-25 MED ORDER — SIMETHICONE 80 MG PO CHEW: 80.0000 mg | CHEWABLE_TABLET | ORAL | Status: DC | PRN

## 2014-04-25 MED ORDER — IBUPROFEN 600 MG PO TABS
600.0000 mg | ORAL_TABLET | Freq: Four times a day (QID) | ORAL | Status: DC
  Administered 2014-04-25 – 2014-04-27 (×9): 600 mg via ORAL
  Filled 2014-04-25 (×9): qty 1

## 2014-04-25 MED ORDER — WITCH HAZEL-GLYCERIN EX PADS: 1.0000 "application " | MEDICATED_PAD | CUTANEOUS | Status: DC | PRN

## 2014-04-25 MED ORDER — LANOLIN HYDROUS EX OINT: TOPICAL_OINTMENT | CUTANEOUS | Status: DC | PRN

## 2014-04-25 MED ORDER — TETANUS-DIPHTH-ACELL PERTUSSIS 5-2.5-18.5 LF-MCG/0.5 IM SUSP: 0.5000 mL | Freq: Once | INTRAMUSCULAR | Status: DC

## 2014-04-25 MED ORDER — DIBUCAINE 1 % RE OINT: 1.0000 "application " | TOPICAL_OINTMENT | RECTAL | Status: DC | PRN

## 2014-04-25 MED ORDER — PRENATAL MULTIVITAMIN CH
1.0000 | ORAL_TABLET | Freq: Every day | ORAL | Status: DC
  Administered 2014-04-25 – 2014-04-26 (×2): 1 via ORAL
  Filled 2014-04-25 (×2): qty 1

## 2014-04-25 MED ORDER — SENNOSIDES-DOCUSATE SODIUM 8.6-50 MG PO TABS
2.0000 | ORAL_TABLET | ORAL | Status: DC
  Administered 2014-04-25 – 2014-04-26 (×2): 2 via ORAL
  Filled 2014-04-25 (×2): qty 2

## 2014-04-25 MED ORDER — INFLUENZA VAC SPLIT QUAD 0.5 ML IM SUSY
0.5000 mL | PREFILLED_SYRINGE | INTRAMUSCULAR | Status: AC
  Administered 2014-04-26: 0.5 mL via INTRAMUSCULAR
  Filled 2014-04-25: qty 0.5

## 2014-04-25 MED ORDER — BENZOCAINE-MENTHOL 20-0.5 % EX AERO: 1.0000 "application " | INHALATION_SPRAY | CUTANEOUS | Status: DC | PRN

## 2014-04-25 MED ORDER — BUSPIRONE HCL 5 MG PO TABS
5.0000 mg | ORAL_TABLET | Freq: Two times a day (BID) | ORAL | Status: DC
  Administered 2014-04-25 – 2014-04-27 (×6): 5 mg via ORAL
  Filled 2014-04-25 (×6): qty 1

## 2014-04-25 MED ORDER — DIPHENHYDRAMINE HCL 25 MG PO CAPS
25.0000 mg | ORAL_CAPSULE | Freq: Four times a day (QID) | ORAL | Status: DC | PRN
Start: 2014-04-25 — End: 2014-04-27

## 2014-04-25 NOTE — Lactation Note (Signed)
This note was copied from the chart of Tammy Frann RiderCristina Ueda. Lactation Consultation Note  Initial visit done,  Breastfeeding consultation services and support information given to patient.  Mom states baby has been sleepy but latching easily and feeding about 10 minutes.  Instructed on feeding cues, breast massage, compression and waking techniques.  Encouraged to call for concerns/assist prn.  Patient Name: Tammy Wilkinson ZOXWR'UToday's Date: 04/25/2014 Reason for consult: Initial assessment   Maternal Data Has patient been taught Hand Expression?: Yes Does the patient have breastfeeding experience prior to this delivery?: Yes  Feeding Feeding Type: Breast Fed Length of feed: 15 min  LATCH Score/Interventions Latch: Grasps breast easily, tongue down, lips flanged, rhythmical sucking.  Audible Swallowing: None Intervention(s): Skin to skin;Hand expression  Type of Nipple: Everted at rest and after stimulation  Comfort (Breast/Nipple): Soft / non-tender     Hold (Positioning): No assistance needed to correctly position infant at breast. Intervention(s): Breastfeeding basics reviewed;Support Pillows;Skin to skin  LATCH Score: 8  Lactation Tools Discussed/Used     Consult Status Consult Status: Follow-up Date: 04/26/14 Follow-up type: In-patient    Huston FoleyMOULDEN, Anne Sebring S 04/25/2014, 5:29 PM

## 2014-04-25 NOTE — Anesthesia Postprocedure Evaluation (Signed)
Anesthesia Post Note  Patient: Frann RiderCristina Rekowski  Procedure(s) Performed: * No procedures listed *  Anesthesia type: Epidural  Patient location: Mother/Baby  Post pain: Pain level controlled  Post assessment: Post-op Vital signs reviewed  Last Vitals:  Filed Vitals:   04/25/14 1910  BP: 132/78  Pulse: 79  Temp: 36.9 C  Resp: 18    Post vital signs: Reviewed  Level of consciousness:alert  Complications: No apparent anesthesia complications

## 2014-04-26 NOTE — Progress Notes (Signed)
PPD #1- SVD  Subjective:   Reports feeling well Tolerating po/ No nausea or vomiting Bleeding is light Pain controlled with Motrin and Percocet Mild back pain at epidural site Up ad lib / ambulatory / voiding without problems Newborn: breastfeeding     Objective:   VS:  VS:  Filed Vitals:   04/25/14 0415 04/25/14 1706 04/25/14 1910 04/26/14 0529  BP: 133/74 131/84 132/78 127/67  Pulse: 94 66 79 73  Temp: 98.5 F (36.9 C) 98 F (36.7 C) 98.4 F (36.9 C) 97.6 F (36.4 C)  TempSrc: Oral Oral Oral Oral  Resp: 18 16 18 18   Height:      Weight:      SpO2: 97%   99%    LABS:  Recent Labs  04/24/14 1245 04/25/14 0643  WBC 14.9* 22.8*  HGB 12.6 11.4*  PLT 333 276   Blood type: A/Positive/-- (03/03 0000) Rubella: Immune (03/03 0000)   I&O: Intake/Output     10/09 0701 - 10/10 0700 10/10 0701 - 10/11 0700   Other     Total Intake(mL/kg)     Blood     Total Output       Net              Physical Exam: Alert and oriented x3 Abdomen: soft, non-tender, non-distended  Fundus: firm, non-tender, U-1 Perineum: Well approximated, no significant erythema, edema, or drainage; healing well. Lochia: small Extremities: No edema, no calf pain or tenderness    Assessment:  PPD # 1G2P2002/ S/P:spontaneous vaginal, 1st degree laceration  Doing well    Plan: Continue routine post partum orders Anticipate D/C home tomorrow   Donette LarryBHAMBRI, Niquan Charnley, N MSN, CNM 04/26/2014, 11:03 AM

## 2014-04-26 NOTE — Progress Notes (Signed)
Clinical Social Work Department PSYCHOSOCIAL ASSESSMENT - MATERNAL/CHILD 75/[id number]/[id number]  Patient:  Tammy Wilkinson, Tammy Wilkinson  Account Number:  [id number]  [place] Date:  04/24/2014  Ardine Eng Name:   Tammy Wilkinson   Clinical Social Worker:  Lucita Ferrara, CLINICAL SOCIAL WORKER   Date/Time:  04/26/2014 08:30 AM  Date Referred:  04/25/2014   Referral source  Central Nursery     Referred reason  Behavioral Health Issues   Other referral source:    I:  FAMILY / HOME ENVIRONMENT Child's legal guardian:  PARENT  Guardian - Name Guardian - Age [place] [address] [place], [place] [address]  Tammy Wilkinson  same as above   Other household support members/support persons Name Relationship DOB  Tammy Wilkinson 84 years old   SON 37 years old   Other support:   MOB stated that she lives at home with the FOB, her daughter and her step son.  Per MOB, they have numerous family members who live in the area who are supportive.    II  PSYCHOSOCIAL DATA Information Source:  Patient Interview  Occupational hygienist Employment:   MOB is currently unemployed.  She stated that she lost her job the day after she learned that she was pregnant.  She stated that the FOB is working at that she hopes to return to work.   Financial resources:  Multimedia programmer If [place]:  [place] / Grade:  N/A Music therapist / Child Services Coordination / Early Interventions:   N/A  Cultural issues impacting care:   None reported    III  STRENGTHS Strengths  Adequate Resources  Home prepared for Child (including basic supplies)  Supportive family/friends   Strength comment:    IV  RISK FACTORS AND CURRENT PROBLEMS Current Problem:  YES   Risk Factor & Current Problem Patient Issue Family Issue Risk Factor / Current Problem Comment  Mental Illness Y N MOB presents with a mental health history significant for depression,  anxiety, and PTSD. MOB was previously prescribed Effexor, but she discontinued the medication prior to the pregnancy.  She is currently prescribed Buspar to assist with her anxiety.    V  SOCIAL WORK ASSESSMENT CSW met with the MOB in her room in order to complete the assessment.  Consult was ordered due to history of anxiety, depression, and PTSD.  MOB presented as receptive to the visit and was easily engaged.  She displayed a full range in affect and was in a pleasant mood.  MOB was observed to be attentive and bonding with the baby throughout the visit. She openly discussed the reason for the CSW consult, and presented with awareness of need to appropriately treat symptoms if/when they occur.    CSW inquired about MOB's thoughts and feelings as she transitions into the postpartum period.  MOB smiled frequently when she looked at the baby, but acknowledged feeling overwhelmed since she her daughter is 89 years old and has been numerous years since she has had a newborn in the home.  CSW normalized her anxiety, and continued to explore with the MOB her support system.  She stated that she is well supported by her family and discussed the positive support that she receives from the FOB.  As CSW continued to explore potential stressors that may negatively impact the transition into the postpartum period, MOB mentioned losing her job shortly after she learned that she was pregnancy.  CSW explored with the MOB how  this event impacted her stress, and she stated that it was difficult since there was an increase in worry, but that she is coping well now since she realizes that she will be able to start looking for a new job in a few months.  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    MOB openly discussed her history of anxiety, depression, and PTSD.  MOB reported that she received these diagnoses "a long  time ago", shared that the most recent trauma occurred in 2013 when one of her closest sisters (who was living with her) committed suicide.  MOB continued to process her feelings related to this event, and how it caused her depression to work.  She shared that she attempted to participate in therapy to process this event, but shared that it was too difficult since she was not ready to work through her feelings.  She stated that it continues to be a difficult event for her to remember, but she shared the belief that she is "coping well" with it.  MOB discussed her ability to talk to the FOB about her feelings when she begins to feel overwhelmed by it.   MOB stated that she had been receiving medication [facility] prior to her pregnancy.  She stated that since she and the FOB were trying to conceive, she started to ween herself off of her medications.  She stated that her OB added the Buspar since she needed assistance with her anxiety.  MOB denied any other mood symptoms during her pregnancy.  CSW guided MOB to explore her thoughts and feelings related to restarting her medications as she transitions into the postpartum period.  MOB voiced preference to not re-start her medications, but discussed that she is willing to if it will prevent a depressive episode.  MOB is able to verbalize potential outcomes without medications and visualize how her ability to be the mother she wants to be would be difficult to achieve if she experiences intense symptoms of depression.  By the end, MOB continued to be unsure about her level of desire to restart medications, but she is aware of needing to be aware of how she feels and to act early if she experiences symptoms.  MOB is aware of resources in the community in the event that she needs services.   No barriers to discharge.   VI SOCIAL WORK PLAN Social Work Plan  Information/Referral to [employer]  No Further  Intervention Required / No Barriers to Discharge   Type of pt/family education:   Postpartum depression and anxiety   If child protective services report - county:   If child protective services report - date:   Information/referral to community resources comment:   CSW provided referral information to [place] and the [employer] [facility] the [place] if MOB decides need to re-start medications.   Other social work plan:   CSW to offer emotional support PRN.

## 2014-04-27 DIAGNOSIS — F319 Bipolar disorder, unspecified: Secondary | ICD-10-CM | POA: Diagnosis present

## 2014-04-27 DIAGNOSIS — F419 Anxiety disorder, unspecified: Secondary | ICD-10-CM

## 2014-04-27 DIAGNOSIS — F329 Major depressive disorder, single episode, unspecified: Secondary | ICD-10-CM | POA: Diagnosis present

## 2014-04-27 MED ORDER — IBUPROFEN 600 MG PO TABS: 600.0000 mg | ORAL_TABLET | Freq: Four times a day (QID) | ORAL | Status: DC

## 2014-04-27 MED ORDER — OXYCODONE-ACETAMINOPHEN 5-325 MG PO TABS: 1.0000 | ORAL_TABLET | ORAL | Status: DC | PRN

## 2014-04-27 NOTE — Discharge Summary (Signed)
Obstetric Discharge Summary Reason for Admission: onset of labor Prenatal Course: hx of PTSD, anxiety/depression, back pain with Percocet use since 22 wks, GBS positive. Intrapartum Procedures: spontaneous vaginal delivery, GBS prophylaxis Postpartum Procedures: none Complications-Operative and Postpartum: 1st degree perineal laceration Hemoglobin  Date Value Ref Range Status  04/25/2014 11.4* 12.0 - 15.0 g/dL Final  4/0/98113/09/2013 91.412.4   Final     HCT  Date Value Ref Range Status  04/25/2014 33.8* 36.0 - 46.0 % Final  09/17/2013 36   Final    Physical Exam:  General: alert and cooperative Lochia: appropriate Uterine Fundus: firm Incision: healing well, no significant drainage, no dehiscence, no significant erythema DVT Evaluation: No evidence of DVT seen on physical exam. Negative Homan's sign. No cords or calf tenderness. No significant calf/ankle edema.  Discharge Diagnoses: Term Pregnancy-delivered  Discharge Information: Date: 04/27/2014 Activity: pelvic rest Diet: routine Medications: PNV, Ibuprofen and Percocet Condition: stable Instructions: refer to practice specific booklet Discharge to: home Follow-up Information   Follow up with MODY,VAISHALI R, MD. Schedule an appointment as soon as possible for a visit in 6 weeks.   Specialty:  Obstetrics and Gynecology   Contact information:   [address] Gash[address] [place] [id number] [phone]       Newborn Data: Live born female on 04/25/14 Birth Weight: 7 lb 5.3 oz (3325 g) APGAR: 7, 9  Home with mother.  Laylia Mui, N 04/27/2014, 11:23 AM

## 2014-04-27 NOTE — Progress Notes (Signed)
PPD #2- SVD  Subjective:   Reports feeling well, back pain improved Tolerating po/ No nausea or vomiting Bleeding is light Pain controlled with Motrin and Percocet Up ad lib / ambulatory / voiding without problems Newborn: breastfeeding     Objective:   VS: VS:  Filed Vitals:   04/25/14 1910 04/26/14 0529 04/26/14 1800 04/27/14 0550  BP: 132/78 127/67 125/81 119/70  Pulse: 79 73 70 62  Temp: 98.4 F (36.9 C) 97.6 F (36.4 C) 98.2 F (36.8 C) 97.6 F (36.4 C)  TempSrc: Oral Oral Oral Axillary  Resp: 18 18 18 18   Height:      Weight:      SpO2:  99% 97%     LABS:  Recent Labs  04/24/14 1245 04/25/14 0643  WBC 14.9* 22.8*  HGB 12.6 11.4*  PLT 333 276   Blood type: A/Positive/-- (03/03 0000) Rubella: Immune (03/03 0000)                I&O: Intake/Output   None     Physical Exam: Alert and oriented X3 Abdomen: soft, non-tender, non-distended  Fundus: firm, non-tender, U-2 Perineum: Well approximated, no significant erythema, edema, or drainage; healing well. Lochia: small Extremities: No edema, no calf pain or tenderness    Assessment: PPD #2  G2P2002/ S/P:spontaneous vaginal, 1st degree laceration Anxiety/Depression-stable Doing well - stable for discharge home   Plan: Discharge home RX's:  Ibuprofen 600mg  po Q 6 hrs prn pain #30 Refill x 0 Percocet 5/325 1 to 2 po Q 4 hrs prn pain #30 Refill x 0 Routine pp visit in [facility] booklet given    Donette LarryBHAMBRI, Debera Sterba, N MSN, CNM 04/27/2014, 10:15 AM

## 2014-04-28 NOTE — Progress Notes (Signed)
Post discharge chart review completed.  

## 2014-05-19 ENCOUNTER — Encounter (HOSPITAL_COMMUNITY): Payer: Self-pay | Admitting: *Deleted

## 2014-08-05 ENCOUNTER — Emergency Department (HOSPITAL_COMMUNITY): Payer: No Typology Code available for payment source

## 2014-08-05 ENCOUNTER — Encounter (HOSPITAL_COMMUNITY): Payer: Self-pay | Admitting: Emergency Medicine

## 2014-08-05 ENCOUNTER — Emergency Department (HOSPITAL_COMMUNITY)
Admission: EM | Admit: 2014-08-05 | Discharge: 2014-08-05 | Disposition: A | Discharge status: Home / Self Care | Payer: No Typology Code available for payment source | Attending: Emergency Medicine | Admitting: Emergency Medicine

## 2014-08-05 DIAGNOSIS — M2660 Temporomandibular joint disorder, unspecified: Secondary | ICD-10-CM | POA: Diagnosis not present

## 2014-08-05 DIAGNOSIS — F419 Anxiety disorder, unspecified: Secondary | ICD-10-CM | POA: Insufficient documentation

## 2014-08-05 DIAGNOSIS — R51 Headache: Secondary | ICD-10-CM | POA: Diagnosis not present

## 2014-08-05 DIAGNOSIS — Z8701 Personal history of pneumonia (recurrent): Secondary | ICD-10-CM | POA: Insufficient documentation

## 2014-08-05 DIAGNOSIS — Z91040 Latex allergy status: Secondary | ICD-10-CM | POA: Diagnosis not present

## 2014-08-05 DIAGNOSIS — J45909 Unspecified asthma, uncomplicated: Secondary | ICD-10-CM | POA: Insufficient documentation

## 2014-08-05 DIAGNOSIS — F329 Major depressive disorder, single episode, unspecified: Secondary | ICD-10-CM | POA: Insufficient documentation

## 2014-08-05 DIAGNOSIS — Z79899 Other long term (current) drug therapy: Secondary | ICD-10-CM | POA: Insufficient documentation

## 2014-08-05 DIAGNOSIS — Z8679 Personal history of other diseases of the circulatory system: Secondary | ICD-10-CM | POA: Insufficient documentation

## 2014-08-05 DIAGNOSIS — Z880 Allergy status to penicillin: Secondary | ICD-10-CM | POA: Diagnosis not present

## 2014-08-05 DIAGNOSIS — Z87891 Personal history of nicotine dependence: Secondary | ICD-10-CM | POA: Diagnosis not present

## 2014-08-05 DIAGNOSIS — M26609 Unspecified temporomandibular joint disorder, unspecified side: Secondary | ICD-10-CM

## 2014-08-05 DIAGNOSIS — R6884 Jaw pain: Secondary | ICD-10-CM | POA: Diagnosis present

## 2014-08-05 MED ORDER — CLINDAMYCIN HCL 150 MG PO CAPS: 300.0000 mg | ORAL_CAPSULE | Freq: Three times a day (TID) | ORAL | Status: DC

## 2014-08-05 MED ORDER — HYDROCODONE-ACETAMINOPHEN 5-325 MG PO TABS: 1.0000 | ORAL_TABLET | Freq: Four times a day (QID) | ORAL | Status: DC | PRN

## 2014-08-05 MED ORDER — NAPROXEN 500 MG PO TABS: 500.0000 mg | ORAL_TABLET | Freq: Two times a day (BID) | ORAL | Status: DC

## 2014-08-05 NOTE — ED Notes (Signed)
Patient transported to X-ray 

## 2014-08-05 NOTE — ED Provider Notes (Signed)
CSN: [id number]     Arrival date & time 08/05/14  4098 History   First MD Initiated Contact with Patient 08/05/14 [phone]     Chief Complaint  Patient presents with  . Jaw Pain     (Consider location/radiation/quality/duration/timing/severity/associated sxs/prior Treatment) HPI Lorrinda Ramstad is a 29 y.o. female with history of headaches, depression, ADHD, anxiety, presents to emergency department complaining of left jaw pain. Patient states she woke up with pain 4 days ago. Patient states pain is on the left side, states woke up with the pain. She does not remember driving on anything hard, denies any injuries. History of same pain years ago, was told she may have TMJ. States she also has very bad teeth, but denies any toothache. Denies any facial swelling. She states she is unable to open mouth due to pain. Denies any fever or chills. Has been taking ibuprofen for pain with relief.  Past Medical History  Diagnosis Date  . Anemia   . Anxiety   . Depression   . ADHD (attention deficit hyperactivity disorder)   . Allergy   . Shortness of breath   . Pneumonia   . Heartburn     hx:  . Headache(784.0)     Hx; of migraines  . Asthma     pt states when she was a child   Past Surgical History  Procedure Laterality Date  . Wisdom tooth extraction    . Laryngoplasty Left 03/29/2013    Procedure: VOCAL CORD MEDIALIZATION;  Surgeon: Serena Colonel, MD;  Location: [facility] OR;  Service: ENT;  Laterality: Left;  with placement of silastic block   Family History  Problem Relation Age of Onset  . Anesthesia problems Neg Hx   . Hypertension Mother   . Hypertension Father    History  Substance Use Topics  . Smoking status: Former Smoker -- 0.50 packs/day    Types: Cigarettes    Quit date: 08/25/2013  . Smokeless tobacco: Never Used  . Alcohol Use: Yes     Comment: rare   OB History    Gravida Para Term Preterm AB TAB SAB Ectopic Multiple Living   Review of Systems   Constitutional: Negative for fever and chills.  HENT: Positive for dental problem. Negative for congestion, ear pain, facial swelling, mouth sores and sore throat.   Respiratory: Negative for cough, chest tightness and shortness of breath.   Cardiovascular: Negative for chest pain, palpitations and leg swelling.  Musculoskeletal: Negative for myalgias, arthralgias, neck pain and neck stiffness.  Skin: Negative for rash.  Neurological: Positive for headaches. Negative for dizziness and weakness.  All other systems reviewed and are negative.     Allergies  Latex; Amoxil; and Penicillins  Home Medications   Prior to Admission medications   Medication Sig Start Date End Date Taking? Authorizing Provider  busPIRone (BUSPAR) 5 MG tablet Take 5 mg by mouth 2 (two) times daily.   Yes Historical Provider, MD  escitalopram (LEXAPRO) 10 MG tablet Take 10 mg by mouth daily. 07/09/14  Yes Historical Provider, MD  ibuprofen (ADVIL,MOTRIN) 600 MG tablet Take 1 tablet (600 mg total) by mouth every 6 (six) hours. 04/27/14  Yes Lawernce Pitts, CNM  tiZANidine (ZANAFLEX) 4 MG tablet Take 4 mg by mouth every 6 (six) hours as needed for muscle spasms.   Yes Historical Provider, MD  oxyCODONE-acetaminophen (PERCOCET/ROXICET) 5-325 MG per tablet Take 1-2 tablets by mouth  every 4 (four) hours as needed (for pain scale less than 7). Patient not taking: Reported on 08/05/2014 04/27/14   Lawernce PittsMelanie N Bhambri, CNM   BP 113/62 mmHg  Pulse 71  Temp(Src) 98.1 F (36.7 C) (Oral)  Resp 17  Ht 5\' 4"  (1.626 m)  Wt 145 lb (65.772 kg)  BMI 24.88 kg/m2  SpO2 99%  LMP 07/12/2014 (Exact Date) Physical Exam  Constitutional: She is oriented to person, place, and time. She appears well-developed and well-nourished. No distress.  HENT:  Head: Normocephalic.  Poor dentition, multiple carries. No dental tenderness. No gum tenderness. No swelling. Tender over left TMJ. Trismus present, unable to open mouth more than two  finger widths. No obvious swelling.   Eyes: Conjunctivae are normal.  Neck: Normal range of motion. Neck supple.  Cardiovascular: Normal rate, regular rhythm and normal heart sounds.   Pulmonary/Chest: Effort normal and breath sounds normal. No respiratory distress. She has no wheezes. She has no rales.  Musculoskeletal: She exhibits no edema.  Neurological: She is alert and oriented to person, place, and time.  Skin: Skin is warm and dry.  Psychiatric: She has a normal mood and affect. Her behavior is normal.  Nursing note and vitals reviewed.   ED Course  Procedures (including critical care time) Labs Review Labs Reviewed - No data to display  Imaging Review Dg Mandible 4 Views  08/05/2014   CLINICAL DATA:  Two week history of left mandibular and temporomandibular joint region pain. No history of trauma  EXAM: MANDIBLE - 4+ VIEW  COMPARISON:  None.  FINDINGS: Frontal, submentovertex, left oblique lateral, and right oblique lateral images were obtained. There is no demonstrable fracture or dislocation. Joint spaces appear overall intact. No erosive change.  IMPRESSION: No abnormality noted by radiography. If there is concern for potential temporomandibular joint meniscal subluxation/dislocation, MR would be the imaging study of choice to further assess.   Electronically Signed   By: Bretta BangWilliam  Woodruff M.D.   On: 08/05/2014 08:08     EKG Interpretation None      MDM   Final diagnoses:  Jaw pain    Patient with left-sided jaw and TMJ pain. No injuries. Patient has poor dentition. No obvious abscesses. No dental tenderness. X-ray of mandible obtained. Normal. We'll discharge home with antibiotics for possible dental infection, NSAIDs, pain medication. Follow with her dentist.  Filed Vitals:   08/05/14 0715 08/05/14 0730 08/05/14 0800 08/05/14 0830  BP: 113/62 105/63 104/60 125/67  Pulse: 71 61 59 60  Temp:      TempSrc:      Resp: 17 18 23 20   Height:      Weight:      SpO2:  99% 99% 99% 100%        Lottie Musselatyana A Eion Timbrook, PA-C 08/05/14 0845  Rolan BuccoMelanie Belfi, MD 08/05/14 1130

## 2014-08-05 NOTE — ED Notes (Signed)
Pt c/o left sided jaw pain. Pt states she has not been able to eat because of it.

## 2014-08-05 NOTE — Discharge Instructions (Signed)
Clindamycin for infection  Until all gone. Norco for severe pain. Naproxen for pain and inflammation. Follow up with a dentist as referred.   Temporomandibular Problems  Temporomandibular joint (TMJ) dysfunction means there are problems with the joint between your jaw and your skull. This is a joint lined by cartilage like other joints in your body but also has a small disc in the joint which keeps the bones from rubbing on each other. These joints are like other joints and can get inflamed (sore) from arthritis and other problems. When this joint gets sore, it can cause headaches and pain in the jaw and the face. CAUSES  Usually the arthritic types of problems are caused by soreness in the joint. Soreness in the joint can also be caused by overuse. This may come from grinding your teeth. It may also come from mis-alignment in the joint. DIAGNOSIS Diagnosis of this condition can often be made by history and exam. Sometimes your caregiver may need X-rays or an MRI scan to determine the exact cause. It may be necessary to see your dentist to determine if your teeth and jaws are lined up correctly. TREATMENT  Most of the time this problem is not serious; however, sometimes it can persist (become chronic). When this happens medications that will cut down on inflammation (soreness) help. Sometimes a shot of cortisone into the joint will be helpful. If your teeth are not aligned it may help for your dentist to make a splint for your mouth that can help this problem. If no physical problems can be found, the problem may come from tension. If tension is found to be the cause, biofeedback or relaxation techniques may be helpful. HOME CARE INSTRUCTIONS   Later in the day, applications of ice packs may be helpful. Ice can be used in a plastic bag with a towel around it to prevent frostbite to skin. This may be used about every 2 hours for 20 to 30 minutes, as needed while awake, or as directed by your  caregiver.  Only take over-the-counter or prescription medicines for pain, discomfort, or fever as directed by your caregiver.  If physical therapy was prescribed, follow your caregiver's directions.  Wear mouth appliances as directed if they were given. Document Released: 03/29/2001 Document Revised: 09/26/2011 Document Reviewed: 07/06/2008 [facility] Patient Information 2015 [place] [place]. This information is not intended to replace advice given to you by your health care provider. Make sure you discuss any questions you have with your health care provider.

## 2014-08-05 NOTE — ED Notes (Signed)
Pt c/o left sided jaw pain since Friday.

## 2014-08-30 ENCOUNTER — Encounter (HOSPITAL_COMMUNITY): Payer: Self-pay | Admitting: Emergency Medicine

## 2014-08-30 ENCOUNTER — Emergency Department (HOSPITAL_COMMUNITY)
Admission: EM | Admit: 2014-08-30 | Discharge: 2014-08-30 | Disposition: A | Discharge status: Home / Self Care | Payer: No Typology Code available for payment source | Attending: Emergency Medicine | Admitting: Emergency Medicine

## 2014-08-30 DIAGNOSIS — K088 Other specified disorders of teeth and supporting structures: Secondary | ICD-10-CM | POA: Insufficient documentation

## 2014-08-30 DIAGNOSIS — K0889 Other specified disorders of teeth and supporting structures: Secondary | ICD-10-CM

## 2014-08-30 MED ORDER — HYDROCODONE-ACETAMINOPHEN 5-325 MG PO TABS: 2.0000 | ORAL_TABLET | ORAL | Status: DC | PRN

## 2014-08-30 MED ORDER — CLINDAMYCIN HCL 300 MG PO CAPS: 300.0000 mg | ORAL_CAPSULE | Freq: Four times a day (QID) | ORAL | Status: DC

## 2014-08-30 NOTE — Discharge Instructions (Signed)
Return to the emergency room with worsening of symptoms, new symptoms or with symptoms that are concerning, especially fevers, drooling, chest pain, SOB, facial swelling, unable to open mouth. Please take all of your antibiotics until finished!   You may develop abdominal discomfort or diarrhea from the antibiotic.  You may help offset this with probiotics which you can buy or get in yogurt. Do not eat  or take the probiotics until 2 hours after your antibiotic.  Ibuprofen 400mg  (2 tablets 200mg ) every 5-6 hours for 3-5 days  Norco for severe pain. Do not operate machinery, drive or drink alcohol while taking narcotics or muscle relaxers. Don't miss you appointment with your Dental surgeon on Monday.    Dental Pain A tooth ache may be caused by cavities (tooth decay). Cavities expose the nerve of the tooth to air and hot or cold temperatures. It may come from an infection or abscess (also called a boil or furuncle) around your tooth. It is also often caused by dental caries (tooth decay). This causes the pain you are having. DIAGNOSIS  Your caregiver can diagnose this problem by exam. TREATMENT   If caused by an infection, it may be treated with medications which kill germs (antibiotics) and pain medications as prescribed by your caregiver. Take medications as directed.  Only take over-the-counter or prescription medicines for pain, discomfort, or fever as directed by your caregiver.  Whether the tooth ache today is caused by infection or dental disease, you should see your dentist as soon as possible for further care. SEEK MEDICAL CARE IF: The exam and treatment you received today has been provided on an emergency basis only. This is not a substitute for complete medical or dental care. If your problem worsens or new problems (symptoms) appear, and you are unable to meet with your dentist, call or return to this location. SEEK IMMEDIATE MEDICAL CARE IF:   You have a fever.  You develop  redness and swelling of your face, jaw, or neck.  You are unable to open your mouth.  You have severe pain uncontrolled by pain medicine. MAKE SURE YOU:   Understand these instructions.  Will watch your condition.  Will get help right away if you are not doing well or get worse. Document Released: 07/04/2005 Document Revised: 09/26/2011 Document Reviewed: 02/20/2008 [facility] Patient Information 2015 [place], [place]. This information is not intended to replace advice given to you by your health care provider. Make sure you discuss any questions you have with your health care provider.

## 2014-08-30 NOTE — ED Provider Notes (Signed)
CSN: [id number]     Arrival date & time 08/30/14  0459 History   First MD Initiated Contact with Patient 08/30/14 0600     Chief Complaint  Patient presents with  . Dental Pain     (Consider location/radiation/quality/duration/timing/severity/associated sxs/prior Treatment) HPI  Tammy Wilkinson is a 29 y.o. female with PMH of anxiety, depression presenting with left-sided upper jaw pain onset 3 days ago. Patient was seen in the ED 3 weeks ago for dental pain and was referred to a dentist. 10 days ago she had 8 teeth extracted. She had reported pain significant better but then reoccurred 3 days ago. She is concerned with infection. She states she cannot sleep due to the pain. She has been taking ibuprofen with minimal relief. No fevers, chills no facial swelling, drooling, shortness of breath, chest pain. Patient without history of diabetes.   Past Medical History  Diagnosis Date  . Anemia   . Anxiety   . Depression   . ADHD (attention deficit hyperactivity disorder)   . Allergy   . Shortness of breath   . Pneumonia   . Heartburn     hx:  . Headache(784.0)     Hx; of migraines  . Asthma     pt states when she was a child   Past Surgical History  Procedure Laterality Date  . Wisdom tooth extraction    . Laryngoplasty Left 03/29/2013    Procedure: VOCAL CORD MEDIALIZATION;  Surgeon: Serena ColonelJefry Rosen, MD;  Location: [facility] OR;  Service: ENT;  Laterality: Left;  with placement of silastic block   Family History  Problem Relation Age of Onset  . Anesthesia problems Neg Hx   . Hypertension Mother   . Hypertension Father    History  Substance Use Topics  . Smoking status: Former Smoker -- 0.50 packs/day    Types: Cigarettes    Quit date: 08/25/2013  . Smokeless tobacco: Never Used  . Alcohol Use: Yes     Comment: rare   OB History    Gravida Para Term Preterm AB TAB SAB Ectopic Multiple Living   2 2 2       2      Review of Systems  Constitutional: Negative for fever and  chills.  HENT: Negative for drooling and facial swelling.   Respiratory: Negative for cough and shortness of breath.   Cardiovascular: Negative for chest pain.  Gastrointestinal: Negative for nausea and vomiting.      Allergies  Latex; Amoxil; and Penicillins  Home Medications   Prior to Admission medications   Medication Sig Start Date End Date Taking? Authorizing Provider  ibuprofen (ADVIL,MOTRIN) 200 MG tablet Take 200 mg by mouth every 6 (six) hours as needed for mild pain.   Yes Historical Provider, MD  busPIRone (BUSPAR) 5 MG tablet Take 5 mg by mouth 2 (two) times daily.    Historical Provider, MD  clindamycin (CLEOCIN) 300 MG capsule Take 1 capsule (300 mg total) by mouth 4 (four) times daily. X 7 days 08/30/14   Louann SjogrenVictoria L Sneha Willig, PA-C  escitalopram (LEXAPRO) 10 MG tablet Take 10 mg by mouth daily. 07/09/14   Historical Provider, MD  HYDROcodone-acetaminophen (NORCO/VICODIN) 5-325 MG per tablet Take 2 tablets by mouth every 4 (four) hours as needed for moderate pain or severe pain. 08/30/14   Louann SjogrenVictoria L Adriann Thau, PA-C  ibuprofen (ADVIL,MOTRIN) 600 MG tablet Take 1 tablet (600 mg total) by mouth every 6 (six) hours. Patient not taking: Reported on 08/30/2014 04/27/14   Shawna OrleansMelanie  N Bhambri, CNM  naproxen (NAPROSYN) 500 MG tablet Take 1 tablet (500 mg total) by mouth 2 (two) times daily. Patient not taking: Reported on 08/30/2014 08/05/14   Tatyana A Kirichenko, PA-C  oxyCODONE-acetaminophen (PERCOCET/ROXICET) 5-325 MG per tablet Take 1-2 tablets by mouth every 4 (four) hours as needed (for pain scale less than 7). Patient not taking: Reported on 08/05/2014 04/27/14   Lawernce Pitts, CNM  tiZANidine (ZANAFLEX) 4 MG tablet Take 4 mg by mouth every 6 (six) hours as needed for muscle spasms.    Historical Provider, MD   BP 112/60 mmHg  Pulse 88  Temp(Src) 98.3 F (36.8 C) (Oral)  Resp 16  SpO2 98%  LMP 07/12/2014 (Exact Date) Physical Exam  Constitutional: She appears well-developed  and well-nourished. No distress.  HENT:  Head: Normocephalic and atraumatic.  Mouth/Throat: Oropharynx is clear and moist. No oropharyngeal exudate.  No trismus or uvula deviation No lip, tongue, facial swelling. No swelling under tongue or tenderness. Patient handling secretions. No drooling. Patient with poor dentition with multiple fillings and removed teeth. Patient with tenderness to gingiva where left upper canine was with mild erythema in this gingiva but no drainable abscess.   Eyes: Conjunctivae are normal. Right eye exhibits no discharge. Left eye exhibits no discharge.  Neck: Normal range of motion. Neck supple.  No neck masses or tenderness.  Cardiovascular: Normal rate and regular rhythm.   Pulmonary/Chest: Effort normal and breath sounds normal. No respiratory distress. She has no wheezes.  Abdominal: Soft. She exhibits no distension. There is no tenderness.  Lymphadenopathy:    She has no cervical adenopathy.  Neurological: She is alert. Coordination normal.  Skin: Skin is warm and dry. She is not diaphoretic.  Nursing note and vitals reviewed.   ED Course  Procedures (including critical care time) Labs Review Labs Reviewed - No data to display  Imaging Review No results found.   EKG Interpretation None      MDM   Final diagnoses:  Pain, dental   Patient with toothache near recently removed tooth.  No gross abscess.  No history of diabetes. Exam unconcerning for Ludwig's angina or spread of infection.  No trismus or uvula deviation. Will treat with clindamycin and pain medicine.  Driving and sedation precautions provided. Patient will follow-up with her neurosurgeon in 2 days.   Discussed return precautions with patient. Discussed all results and patient verbalizes understanding and agrees with plan.   Louann Sjogren, PA-C 08/30/14 0930  Loren Racer, MD 08/31/14 [phone]

## 2014-08-30 NOTE — ED Notes (Signed)
Pt reports being seen in the ED approx. 3 weeks ago for dental pain. Received referral to Dentist where she had 8 teeth pulled. Reports she was healing well until Thursday when she started having severe pain. Pain is most significant in the L upper jaw. Ax4, NAD.

## 2014-12-30 ENCOUNTER — Ambulatory Visit (HOSPITAL_COMMUNITY): Payer: No Typology Code available for payment source | Admitting: Clinical

## 2014-12-30 DIAGNOSIS — S060XAA Concussion with loss of consciousness status unknown, initial encounter: Secondary | ICD-10-CM

## 2014-12-30 DIAGNOSIS — S060X9A Concussion with loss of consciousness of unspecified duration, initial encounter: Secondary | ICD-10-CM

## 2014-12-30 HISTORY — DX: Concussion with loss of consciousness of unspecified duration, initial encounter: S06.0X9A

## 2014-12-30 HISTORY — DX: Concussion with loss of consciousness status unknown, initial encounter: S06.0XAA

## 2015-01-08 ENCOUNTER — Ambulatory Visit (INDEPENDENT_AMBULATORY_CARE_PROVIDER_SITE_OTHER): Payer: No Typology Code available for payment source | Admitting: Psychiatry

## 2015-01-08 ENCOUNTER — Encounter (HOSPITAL_COMMUNITY): Payer: Self-pay | Admitting: Psychiatry

## 2015-01-08 VITALS — BP 102/62 | HR 82 | Ht 64.0 in | Wt 143.6 lb

## 2015-01-08 DIAGNOSIS — F4310 Post-traumatic stress disorder, unspecified: Secondary | ICD-10-CM | POA: Diagnosis not present

## 2015-01-08 DIAGNOSIS — F3131 Bipolar disorder, current episode depressed, mild: Secondary | ICD-10-CM | POA: Diagnosis not present

## 2015-01-08 MED ORDER — LAMOTRIGINE 25 MG PO TABS: ORAL_TABLET | ORAL | Status: DC

## 2015-01-08 MED ORDER — VENLAFAXINE HCL ER 37.5 MG PO CP24: ORAL_CAPSULE | ORAL | Status: DC

## 2015-01-08 NOTE — Progress Notes (Signed)
[facility] Initial Assessment Note  Tammy Wilkinson [id number] 29 y.o.  01/08/2015 2:05 PM  Chief Complaint:  I need medication.  I have a lot of irritability mood swings anger and depression.  I stop taking medication since January.  History of Present Illness:  Patient is 29 year old Caucasian, married, unemployed female who is self-referred for the management of her psychiatric illness.  Patient is seeing psychiatrist since age 89 for depression, anxiety, irritability and severe mood swings.  She was taking Lexapro and BuSpar after postpartum however in January she stopped taking because she felt it is not working very well.  Before pregnancy she was seen psychiatrist at [facility] and she was taking Zoloft, Seroquel, Klonopin, present seen and Adderall.  Patient do not want to go back to [facility] because she does not like waiting there.  Since she stopped the medication she believe her depression and irritability got worse.  She admitted severe mood swing, poor sleep, decreased attention, concentration with feeling of hopelessness or worthlessness.  She endorse highs and lows in her mood.  She admitted that she is very irritable and angry or very depressed sad and isolated.  She also endorsed racing thoughts and could not function.  She wants to go back on medication but not sure about Lexapro.  Patient has history of self abusive behavior however in recent years she has not cut herself.  She admitted history of impulsive behavior with labile mood, distractibility and grandiosity.  She endorse history of sexual inappropriate behavior.  She admitted nightmares, flashback and bad dreams.  To offer her half sister committed suicide 3 years ago.  Patient told one sister [place] the [place] and 3 months later and her sister in [place] taken overdose on pain medication.  Patient afraid and does not want to go through severe depression.  Though she denies any paranoia,  hallucination or any active or passive suicidal thoughts but admitted hyperactivity, poor attention, poor concentration, decreased energy, insomnia, racing thoughts and severe mood swing and irritability.  She does not want to do many things.  Recently she had a family vacation in [place].  She admitted drinking and smoking marijuana and she fell in the pool causes superficial laceration below the left eye and her foot was injured.  Patient regrets that she should not drink alcohol.  At this time she is not taking any psychotic of medication.  She scheduled to see Scarlette Calico for coping and social skills.  Patient denies any history of psychiatric inpatient treatment or any suicidal attempt.  Suicidal Ideation: No Plan Formed: No Patient has means to carry out plan: No  Homicidal Ideation: No Plan Formed: No Patient has means to carry out plan: No  Past Psychiatric History/Hospitalization(s): Patient seeing psychiatrist since age 38.  At that time her parents divorced.  Around same time she got pregnant and having a lot of behavioral issues.  She was cutting herself.  She has seen Dr. Ladona Ridgel in this office and then she has seen psychiatrist at [facility].  She had diagnosed with ADHD, borderline personality disorder and severe depression.  She had tried in the past Effexor, prazosin, Cymbalta , Seroquel, trazodone, Zoloft, Lexapro, Klonopin, Abilify, Valium, Adderall and Xanax.  She endorse history of poor impulse control, sexual inappropriate behavior , agitation and anger issues.  She remember Effexor helped the most.  Anxiety: Yes Bipolar Disorder: Yes Depression: Yes Mania: Yes Psychosis: No Schizophrenia: No Personality Disorder: Patient diagnosed borderline personality disorder Hospitalization for psychiatric illness:  No History of Electroconvulsive Shock Therapy: No Prior Suicide Attempts: No  Medical History; Patient has no active health issues.  Traumatic brain injury: Patient denies any  traumatic brain injury.  Family History; Patient endorse mother has depression, sister has bipolar depression who committed suicide.  Another sister had addiction with pain medication who killed herself by taking overdose on pain medication.  Education and Work History; Patient is high school graduate.  She is enrolled in college and her major discriminate justice.  Psychosocial History; Patient born in [place] and grew up in [place].  Her parents divorced at early age.  Patient became pregnant at age 46.  Her relationship ended after 10 years.  She married to her current husband 1 year ago.  She has 51-month-old baby from her husband.  Patient told her husband is very supportive.  Patient currently unemployed.  She had good social contact with her parents.  Legal History; Patient denies any legal issues.  History Of Abuse; Patient denies any history of abuse.  Substance Abuse History; Patient admitted history of alcohol and marijuana use.  However claims to be sober in recent weeks  Review of Systems  Eyes:       Superficial scratches below her left eye.   Musculoskeletal:       Foot pain  Skin:       Tattoos on both arms  Neurological: Negative for dizziness.  Psychiatric/Behavioral: Positive for depression, hallucinations and substance abuse. Negative for memory loss. The patient is nervous/anxious and has insomnia.     Psychiatric: Agitation: Irritability Hallucination: No Depressed Mood: Yes Insomnia: Yes Hypersomnia: No Altered Concentration: No Feels Worthless: Yes Grandiose Ideas: No Belief In Special Powers: No New/Increased Substance Abuse: Yes Compulsions: No  Neurologic: Headache: No Seizure: No Paresthesias: No   Outpatient Encounter Prescriptions as of 01/08/2015  Medication Sig  . cetirizine (ZYRTEC) 10 MG tablet Take 10 mg by mouth daily.  Marland Kitchen ibuprofen (ADVIL,MOTRIN) 200 MG tablet Take 200 mg by mouth every 6 (six) hours as needed for mild  pain.  . [DISCONTINUED] HYDROcodone-acetaminophen (NORCO/VICODIN) 5-325 MG per tablet Take 2 tablets by mouth every 4 (four) hours as needed for moderate pain or severe pain.  Marland Kitchen lamoTRIgine (LAMICTAL) 25 MG tablet Take 1 tab daily for 1 week and than 2 tab daily  . venlafaxine XR (EFFEXOR XR) 37.5 MG 24 hr capsule Take 1 capsule for 1 week and than 2 capsule daily  . [DISCONTINUED] busPIRone (BUSPAR) 5 MG tablet Take 5 mg by mouth 2 (two) times daily.  . [DISCONTINUED] clindamycin (CLEOCIN) 300 MG capsule Take 1 capsule (300 mg total) by mouth 4 (four) times daily. X 7 days (Patient not taking: Reported on 01/08/2015)  . [DISCONTINUED] escitalopram (LEXAPRO) 10 MG tablet Take 10 mg by mouth daily.  . [DISCONTINUED] ibuprofen (ADVIL,MOTRIN) 600 MG tablet Take 1 tablet (600 mg total) by mouth every 6 (six) hours. (Patient not taking: Reported on 08/30/2014)  . [DISCONTINUED] naproxen (NAPROSYN) 500 MG tablet Take 1 tablet (500 mg total) by mouth 2 (two) times daily. (Patient not taking: Reported on 08/30/2014)  . [DISCONTINUED] oxyCODONE-acetaminophen (PERCOCET/ROXICET) 5-325 MG per tablet Take 1-2 tablets by mouth every 4 (four) hours as needed (for pain scale less than 7). (Patient not taking: Reported on 08/05/2014)  . [DISCONTINUED] tiZANidine (ZANAFLEX) 4 MG tablet Take 4 mg by mouth every 6 (six) hours as needed for muscle spasms.   No facility-administered encounter medications on file as of 01/08/2015.    No  results found for this or any previous visit (from the past 2160 hour(s)).    Constitutional:  BP 102/62 mmHg  Pulse 82  Ht 5\' 4"  (1.626 m)  Wt 143 lb 9.6 oz (65.137 kg)  BMI 24.64 kg/m2  LMP 12/21/2014 (Within Days)  Breastfeeding? No   Musculoskeletal: Strength & Muscle Tone: within normal limits Gait & Station: normal Patient leans: N/A  Psychiatric Specialty Exam: General Appearance: Casual and Multiple tattoos in her arms  Eye Contact::  Fair  Speech:  fast  Volume:   Increased  Mood:  Anxious, Depressed and Irritable  Affect:  Labile  Thought Process:  Circumstantial  Orientation:  Full (Time, Place, and Person)  Thought Content:  Rumination  Suicidal Thoughts:  No  Homicidal Thoughts:  No  Memory:  Immediate;   Fair Recent;   Fair Remote;   Fair  Judgement:  Fair  Insight:  Fair  Psychomotor Activity:  Increased  Concentration:  Fair  Recall:  Fair  Fund of Knowledge:  Good  Language:  Fair  Akathisia:  No  Handed:  Right  AIMS (if indicated):     Assets:  Communication Skills Desire for Improvement Financial Resources/Insurance  ADL's:  Intact  Cognition:  WNL  Sleep:        New problem, with additional work up planned, Review of Psycho-Social Stressors (1), Review or order clinical lab tests (1), Decision to obtain old records (1), Review and summation of old records (2), Established Problem, Worsening (2), New Problem, with no additional work-up planned (3), Review of Medication Regimen & Side Effects (2) and Review of New Medication or Change in Dosage (2)  Assessment: Axis I: Bipolar disorder depressed type, posttraumatic stress disorder, depressive disorder mild recurrent,  Axis II: Deferred  Axis III:  Past Medical History  Diagnosis Date  . Anemia   . Anxiety   . Depression   . ADHD (attention deficit hyperactivity disorder)   . Allergy   . Shortness of breath   . Pneumonia   . Heartburn     hx:  . Headache(784.0)     Hx; of migraines  . Asthma     pt states when she was a child  . Concussion 12/30/14     Plan:  I review her symptoms, history, current medication and her psychosocial stressors.  Patient is experiencing increased irritability, anger, mood swing and racing thoughts.  She had a good response with Effexor.  We will start Effexor 37.5 mg per 1 week and gradually increase to 75 mg.  I will also start low-dose Lamictal to help her mood lability anger and irritability.  We will start Lamictal 25 mg daily  for 1 week and gradually increase to 50 mg.  Discussed medication side effects and benefits specialty if she has a rash that she should stop the medication immediately.  Patient is scheduled to see Scarlette Calico for coping and social skills.  We will get records from [place].  Discuss safety plan that anytime having active suicidal thoughts or homicidal thoughts and she need to call 911 or go to the local emergency room.  I will see her again in 3-4 weeks.  Wendy Hoback T., MD 01/08/2015

## 2015-01-13 ENCOUNTER — Ambulatory Visit (INDEPENDENT_AMBULATORY_CARE_PROVIDER_SITE_OTHER): Payer: No Typology Code available for payment source | Admitting: Clinical

## 2015-01-13 ENCOUNTER — Encounter (HOSPITAL_COMMUNITY): Payer: Self-pay | Admitting: Clinical

## 2015-01-13 DIAGNOSIS — F3132 Bipolar disorder, current episode depressed, moderate: Secondary | ICD-10-CM

## 2015-01-13 DIAGNOSIS — F4310 Post-traumatic stress disorder, unspecified: Secondary | ICD-10-CM

## 2015-01-13 DIAGNOSIS — F4321 Adjustment disorder with depressed mood: Secondary | ICD-10-CM
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
## 2015-02-03 ENCOUNTER — Encounter (HOSPITAL_COMMUNITY): Payer: Self-pay | Admitting: Psychiatry

## 2015-02-03 ENCOUNTER — Ambulatory Visit (INDEPENDENT_AMBULATORY_CARE_PROVIDER_SITE_OTHER): Payer: No Typology Code available for payment source | Admitting: Psychiatry

## 2015-02-03 VITALS — BP 126/80 | HR 75 | Ht 64.0 in | Wt 148.8 lb

## 2015-02-03 DIAGNOSIS — Z79899 Other long term (current) drug therapy: Secondary | ICD-10-CM

## 2015-02-03 DIAGNOSIS — F3130 Bipolar disorder, current episode depressed, mild or moderate severity, unspecified: Secondary | ICD-10-CM | POA: Diagnosis not present

## 2015-02-03 DIAGNOSIS — F4310 Post-traumatic stress disorder, unspecified: Secondary | ICD-10-CM | POA: Diagnosis not present

## 2015-02-03 DIAGNOSIS — F3131 Bipolar disorder, current episode depressed, mild: Secondary | ICD-10-CM

## 2015-02-03 MED ORDER — LAMOTRIGINE 100 MG PO TABS: 100.0000 mg | ORAL_TABLET | Freq: Every day | ORAL | Status: DC

## 2015-02-03 MED ORDER — ESZOPICLONE 2 MG PO TABS: 2.0000 mg | ORAL_TABLET | Freq: Every evening | ORAL | Status: DC | PRN

## 2015-02-03 MED ORDER — VENLAFAXINE HCL ER 75 MG PO CP24: 75.0000 mg | ORAL_CAPSULE | Freq: Every day | ORAL | Status: DC

## 2015-02-04 NOTE — Progress Notes (Signed)
[facility] [address] Progress Note  Tammy Wilkinson [id number] 29 y.o.  02/04/2015 12:38 PM  Chief Complaint:  I am feeling less depressed and less irritable.  However I still have poor sleep.  I still have mood swings.   History of Present Illness:  Patient is 29 year old Caucasian, married, unemployed female who was seen first time on June 23 as initial evaluation.  She was self-referred for the management of her psychiatric illness.  We started her on Lamictal and Effexor.  She seen some improvement in her anxiety and depression.  However she still feels irritable, angry, mood swing and poor sleep.  She is tolerating her medication and denies any side effects.  She has no rash or itching.  She has headaches however they are not very intense.  She endorsed due to lack of sleep she gets tired.  She reported racing thoughts and nightmares and flashback.  She started seeing Thelma Barge and she wants to continue counseling and therapy.  She denies any paranoia or any hallucination.  She denies any drinking or smoking marijuana since the last visit.  Her laceration is also healed and her foot pain is also resolved.  She denies any hallucination, paranoia or any suicidal thoughts or homicidal thought.  She is not involved in any self abusive behavior.  She is open to try a higher dose of Lamictal however she is also requesting to try something for insomnia.  In the past she had tried Ambien however she became scared when she had forgetfulness and memory impairment with Ambien.  Her appetite is okay.  Her vitals are stable.  She lives with her husband who is very supportive.  She has 69-month-old daughter.  Patient has good social support from her parents.  Suicidal Ideation: No Plan Formed: No Patient has means to carry out plan: No  Homicidal Ideation: No Plan Formed: No Patient has means to carry out plan: No  Past Psychiatric History/Hospitalization(s): Patient seeing psychiatrist since age  67.  At that time her parents divorced.  Around same time she got pregnant and having a lot of behavioral issues.  She was cutting herself.  She has seen Dr. Ladona Ridgel in this office and then she has seen psychiatrist at [facility].  She had diagnosed with ADHD, borderline personality disorder and severe depression.  She had tried in the past Effexor, prazosin, Cymbalta , Seroquel, trazodone, Zoloft, Lexapro, Klonopin, Abilify, Valium, Adderall and Xanax.  She endorse history of poor impulse control, sexual inappropriate behavior , agitation and anger issues.  She remember Effexor helped the most.  Anxiety: Yes Bipolar Disorder: Yes Depression: Yes Mania: Yes Psychosis: No Schizophrenia: No Personality Disorder: Patient diagnosed borderline personality disorder Hospitalization for psychiatric illness: No History of Electroconvulsive Shock Therapy: No Prior Suicide Attempts: No  Medical History; Patient has no active health issues.  Review of Systems  Constitutional: Negative.   Cardiovascular: Negative for chest pain.  Gastrointestinal: Negative for nausea and vomiting.  Skin: Negative for itching and rash.       Tattoos on both arms  Neurological: Positive for headaches. Negative for dizziness.  Psychiatric/Behavioral: Negative for memory loss. The patient is nervous/anxious and has insomnia.     Psychiatric: Agitation: Irritability Hallucination: No Depressed Mood: Yes Insomnia: Yes Hypersomnia: No Altered Concentration: No Feels Worthless: No Grandiose Ideas: No Belief In Special Powers: No New/Increased Substance Abuse: No Compulsions: No  Neurologic: Headache: Yes Seizure: No Paresthesias: No   Outpatient Encounter Prescriptions as of 02/03/2015  Medication Sig  .  cetirizine (ZYRTEC) 10 MG tablet Take 10 mg by mouth daily.  . eszopiclone (LUNESTA) 2 MG TABS tablet Take 1 tablet (2 mg total) by mouth at bedtime as needed for sleep. Take immediately before bedtime  .  ibuprofen (ADVIL,MOTRIN) 200 MG tablet Take 200 mg by mouth every 6 (six) hours as needed for mild pain.  Marland Kitchen lamoTRIgine (LAMICTAL) 100 MG tablet Take 1 tablet (100 mg total) by mouth daily.  Marland Kitchen venlafaxine XR (EFFEXOR-XR) 75 MG 24 hr capsule Take 1 capsule (75 mg total) by mouth daily with breakfast.  . [DISCONTINUED] lamoTRIgine (LAMICTAL) 25 MG tablet Take 1 tab daily for 1 week and than 2 tab daily  . [DISCONTINUED] venlafaxine XR (EFFEXOR XR) 37.5 MG 24 hr capsule Take 1 capsule for 1 week and than 2 capsule daily   No facility-administered encounter medications on file as of 02/03/2015.    No results found for this or any previous visit (from the past 2160 hour(s)).    Constitutional:  BP 126/80 mmHg  Pulse 75  Ht 5\' 4"  (1.626 m)  Wt 148 lb 12.8 oz (67.495 kg)  BMI 25.53 kg/m2  LMP 12/21/2014 (Within Days)   Musculoskeletal: Strength & Muscle Tone: within normal limits Gait & Station: normal Patient leans: N/A  Psychiatric Specialty Exam: General Appearance: Casual and Multiple tattoos in her arms  Eye Contact::  Fair  Speech:  Normal Rate  Volume:  Normal  Mood:  Anxious and Depressed  Affect:  Labile  Thought Process:  Coherent  Orientation:  Full (Time, Place, and Person)  Thought Content:  Rumination  Suicidal Thoughts:  No  Homicidal Thoughts:  No  Memory:  Immediate;   Fair Recent;   Fair Remote;   Fair  Judgement:  Fair  Insight:  Fair  Psychomotor Activity:  Increased  Concentration:  Fair  Recall:  Fair  Fund of Knowledge:  Good  Language:  Fair  Akathisia:  No  Handed:  Right  AIMS (if indicated):     Assets:  Communication Skills Desire for Improvement Financial Resources/Insurance  ADL's:  Intact  Cognition:  WNL  Sleep:        Established Problem, Stable/Improving (1), Review of Psycho-Social Stressors (1), Review or order clinical lab tests (1), Review and summation of old records (2), Review of Last Therapy Session (1), Review of  Medication Regimen & Side Effects (2) and Review of New Medication or Change in Dosage (2)  Assessment: Axis I: Bipolar disorder depressed type, posttraumatic stress disorder, depressive disorder mild recurrent,  Axis II: Deferred  Axis III:  Past Medical History  Diagnosis Date  . Anemia   . Anxiety   . Depression   . ADHD (attention deficit hyperactivity disorder)   . Allergy   . Shortness of breath   . Pneumonia   . Heartburn     hx:  . Headache(784.0)     Hx; of migraines  . Asthma     pt states when she was a child  . Concussion 12/30/14     Plan:  Patient is tolerating Effexor and Lamictal.  Her depression is somewhat improved but she still have irritability insomnia and mood swings.  She is tolerating Lamictal and she has no rash or itching.  I recommended to increase Lamictal 100 mg daily.  She had never tried [place] and I will add Lunesta 2 mg as needed for insomnia.  Discussed medication side effects especially hypnotics can cause sedation and memory impairment.  Recommended to take  only as needed.  Continue Effexor 75 mg daily.  I would also order a CBC, CMP, hemoglobin and TSH.  Encouraged to see Scarlette CalicoFrances for coping and social skills.  We are still awaiting records from [facility].   Discuss safety plan that anytime having active suicidal thoughts or homicidal thoughts and she need to call 911 or go to the local emergency room.  I will see her again in 3-4 weeks.  Time spent 25 minutes.  Shevette Bess T., MD 02/04/2015

## 2015-02-10 ENCOUNTER — Ambulatory Visit (HOSPITAL_COMMUNITY): Payer: Self-pay | Admitting: Clinical

## 2015-02-19 ENCOUNTER — Other Ambulatory Visit (HOSPITAL_COMMUNITY): Payer: Self-pay | Admitting: Psychiatry

## 2015-02-23 ENCOUNTER — Ambulatory Visit (HOSPITAL_COMMUNITY): Payer: Self-pay | Admitting: Clinical

## 2015-02-23 ENCOUNTER — Other Ambulatory Visit (HOSPITAL_COMMUNITY): Payer: Self-pay | Admitting: Psychiatry

## 2015-02-23 DIAGNOSIS — F3131 Bipolar disorder, current episode depressed, mild: Secondary | ICD-10-CM

## 2015-02-25 NOTE — Telephone Encounter (Signed)
Telephone call from [facility] requesting a refill of patient's prescribed Lunesta, last filled on 02/03/15 with no refills and patient returns for next evaluation on 03/17/15.

## 2015-02-26 MED ORDER — ESZOPICLONE 2 MG PO TABS: 2.0000 mg | ORAL_TABLET | Freq: Every evening | ORAL | Status: DC | PRN

## 2015-02-26 NOTE — Telephone Encounter (Signed)
New order for patient's requested Lunesta authorized by Dr. Lolly Mustache and printed.  Called patient to inform order was prepared for her to pick up.

## 2015-02-26 NOTE — Addendum Note (Signed)
Addended by: Wilder Glade on: 02/26/2015 03:53 PM   Modules accepted: Orders

## 2015-03-03 ENCOUNTER — Telehealth (HOSPITAL_COMMUNITY): Payer: Self-pay

## 2015-03-03 NOTE — Telephone Encounter (Signed)
03/03/15 2:15pm Patient DL #[id number] came and pick-up rx script.Marland KitchenMarguerite Olea

## 2015-03-17 ENCOUNTER — Encounter (HOSPITAL_COMMUNITY): Payer: Self-pay | Admitting: Psychiatry

## 2015-03-17 ENCOUNTER — Ambulatory Visit (INDEPENDENT_AMBULATORY_CARE_PROVIDER_SITE_OTHER): Payer: No Typology Code available for payment source | Admitting: Psychiatry

## 2015-03-17 VITALS — BP 126/86 | HR 109 | Ht 64.0 in | Wt 150.4 lb

## 2015-03-17 DIAGNOSIS — F3130 Bipolar disorder, current episode depressed, mild or moderate severity, unspecified: Secondary | ICD-10-CM | POA: Diagnosis not present

## 2015-03-17 DIAGNOSIS — F3131 Bipolar disorder, current episode depressed, mild: Secondary | ICD-10-CM

## 2015-03-17 DIAGNOSIS — F4310 Post-traumatic stress disorder, unspecified: Secondary | ICD-10-CM

## 2015-03-17 MED ORDER — VENLAFAXINE HCL ER 75 MG PO CP24: 75.0000 mg | ORAL_CAPSULE | Freq: Every day | ORAL | Status: DC

## 2015-03-17 MED ORDER — LAMOTRIGINE 100 MG PO TABS: 100.0000 mg | ORAL_TABLET | Freq: Every day | ORAL | Status: DC

## 2015-03-17 MED ORDER — QUETIAPINE FUMARATE 100 MG PO TABS: ORAL_TABLET | ORAL | Status: DC

## 2015-03-17 NOTE — Progress Notes (Signed)
[facility] [address] Progress Note  Tammy Wilkinson [id number] 29 y.o.  03/17/2015 3:57 PM  Chief Complaint:  I did not like Lunesta.  It is getting a metallic taste in my mouth.  It is expensive.   History of Present Illness:  Tammy Wilkinson came for her follow-up appointment.  On her last visit we increased her Lamictal to 100 mg to help mood lability and irritability and recommended to try Lunesta 2 mg.  She has noticed some improvement in her mood but she still have ups and down but they are less intense and less frequent.  She likes Lamictal.  She has no rash or itching.  She still have insomnia and racing thoughts.  She did not like Lunesta a cause of bad taste in the mouth and sometime it does not work.  She still have nightmares and flashback but denies any feeling of hopelessness or worthlessness.  She denies any self abusive behavior.  She started counseling with Tammy Wilkinson and report things are going better.  She denies any hallucination or any paranoia.  She has some anxiety but denies any major panic attack.  Her appetite is okay.  Her vitals are stable.  She forgot her blood work but promised to do with before her next appointment.  She lives with her husband who is very supportive.  She has 65-month-old daughter.  Patient has good support from her parents.  Suicidal Ideation: No Plan Formed: No Patient has means to carry out plan: No  Homicidal Ideation: No Plan Formed: No Patient has means to carry out plan: No  Past Psychiatric History/Hospitalization(s): Patient seeing psychiatrist since age 62 when her parents got divorced.  Around same time she got pregnant and having a lot of behavioral issues.  She was cutting herself.  She has seen Dr. Ladona Wilkinson in this office and then she has seen psychiatrist at [facility].  She had diagnosed with ADHD, bipolar disorder, borderline personality disorder and severe depression.  She had tried in the past Effexor, prazosin, Cymbalta , Seroquel,  trazodone, Zoloft, Lexapro, Klonopin, Abilify, Valium, Adderall and Xanax.  She endorse history of poor impulse control, sexual inappropriate behavior , agitation and anger issues.  She remember Effexor helped the most.  Anxiety: Yes Bipolar Disorder: Yes Depression: Yes Mania: Yes Psychosis: No Schizophrenia: No Personality Disorder: Patient diagnosed borderline personality disorder Hospitalization for psychiatric illness: No History of Electroconvulsive Shock Therapy: No Prior Suicide Attempts: No  Medical History; Patient has no active health issues.  Review of Systems  Constitutional: Negative.   Eyes: Negative.   Cardiovascular: Negative for chest pain and palpitations.  Gastrointestinal: Negative for nausea and vomiting.  Skin: Negative for itching and rash.       Tattoos on both arms  Neurological: Negative for dizziness.  Psychiatric/Behavioral: Negative for memory loss. The patient has insomnia.     Psychiatric: Agitation: Irritability Hallucination: No Depressed Mood: No Insomnia: Yes Hypersomnia: No Altered Concentration: No Feels Worthless: No Grandiose Ideas: No Belief In Special Powers: No New/Increased Substance Abuse: No Compulsions: No  Neurologic: Headache: Yes Seizure: No Paresthesias: No   Outpatient Encounter Prescriptions as of 03/17/2015  Medication Sig  . cetirizine (ZYRTEC) 10 MG tablet Take 10 mg by mouth daily.  Marland Kitchen ibuprofen (ADVIL,MOTRIN) 200 MG tablet Take 200 mg by mouth every 6 (six) hours as needed for mild pain.  Marland Kitchen lamoTRIgine (LAMICTAL) 100 MG tablet Take 1 tablet (100 mg total) by mouth daily.  . QUEtiapine (SEROQUEL) 100 MG tablet Take 1/2 to  1 tab at bed time  . venlafaxine XR (EFFEXOR-XR) 75 MG 24 hr capsule Take 1 capsule (75 mg total) by mouth daily with breakfast.  . [DISCONTINUED] eszopiclone (LUNESTA) 2 MG TABS tablet Take 1 tablet (2 mg total) by mouth at bedtime as needed for sleep. Take immediately before bedtime  .  [DISCONTINUED] lamoTRIgine (LAMICTAL) 100 MG tablet Take 1 tablet (100 mg total) by mouth daily.  . [DISCONTINUED] venlafaxine XR (EFFEXOR-XR) 75 MG 24 hr capsule Take 1 capsule (75 mg total) by mouth daily with breakfast.   No facility-administered encounter medications on file as of 03/17/2015.    No results found for this or any previous visit (from the past 2160 hour(s)).    Constitutional:  BP 126/86 mmHg  Pulse 109  Ht 5\' 4"  (1.626 m)  Wt 150 lb 6.4 oz (68.221 kg)  BMI 25.80 kg/m2   Musculoskeletal: Strength & Muscle Tone: within normal limits Gait & Station: normal Patient leans: N/A  Psychiatric Specialty Exam: General Appearance: Casual and Multiple tattoos in her arms  Eye Contact::  Fair  Speech:  Normal Rate  Volume:  Normal  Mood:  Anxious  Affect:  Labile  Thought Process:  Coherent  Orientation:  Full (Time, Place, and Person)  Thought Content:  Rumination  Suicidal Thoughts:  No  Homicidal Thoughts:  No  Memory:  Immediate;   Fair Recent;   Fair Remote;   Fair  Judgement:  Fair  Insight:  Fair  Psychomotor Activity:  Increased  Concentration:  Fair  Recall:  Fair  Fund of Knowledge:  Good  Language:  Fair  Akathisia:  No  Handed:  Right  AIMS (if indicated):     Assets:  Communication Skills Desire for Improvement Financial Resources/Insurance  ADL's:  Intact  Cognition:  WNL  Sleep:        Established Problem, Stable/Improving (1), Review of Psycho-Social Stressors (1), Established Problem, Worsening (2), Review of Last Therapy Session (1), Review of Medication Regimen & Side Effects (2) and Review of New Medication or Change in Dosage (2)  Assessment: Axis I: Bipolar disorder depressed type, posttraumatic stress disorder, depressive disorder mild recurrent,  Axis II: Borderline traits   Axis III:  Past Medical History  Diagnosis Date  . Anemia   . Anxiety   . Depression   . ADHD (attention deficit hyperactivity disorder)   .  Allergy   . Shortness of breath   . Pneumonia   . Heartburn     hx:  . Headache(784.0)     Hx; of migraines  . Asthma     pt states when she was a child  . Concussion 12/30/14     Plan:  Reinforce blood work .  Discontinue Lunesta since she does not like the side effects and some nights it does not help.  In the past she had tried Seroquel and she liked to go back on low-dose Seroquel.  I will start Seroquel 100 mg half to one tablet at bedtime to help insomnia and mood lability.  Continue Lamictal 100 mg daily and Effexor 75 mg daily.  Discussed medication side effects patient metabolic side effects with Seroquel.  Encouraged to keep appointment with therapist.  We are still awaiting records from [facility].  Discuss safety plan that anytime having active suicidal thoughts or homicidal thoughts then she need to call 911 or go to the local emergency room.  Follow-up in 2 months. Time spent 25 minutes.  Reola Buckles T., MD 03/17/2015

## 2015-03-27 LAB — CBC WITH DIFFERENTIAL/PLATELET
BASOS PCT: 0 % (ref 0–1)
Basophils Absolute: 0 10*3/uL (ref 0.0–0.1)
Eosinophils Absolute: 0.2 10*3/uL (ref 0.0–0.7)
Eosinophils Relative: 2 % (ref 0–5)
HEMATOCRIT: 37 % (ref 36.0–46.0)
Hemoglobin: 12.2 g/dL (ref 12.0–15.0)
Lymphocytes Relative: 39 % (ref 12–46)
Lymphs Abs: 3.4 10*3/uL (ref 0.7–4.0)
MCH: 31.7 pg (ref 26.0–34.0)
MCHC: 33 g/dL (ref 30.0–36.0)
MCV: 96.1 fL (ref 78.0–100.0)
MONO ABS: 0.6 10*3/uL (ref 0.1–1.0)
MONOS PCT: 7 % (ref 3–12)
MPV: 8.7 fL (ref 8.6–12.4)
Neutro Abs: 4.6 10*3/uL (ref 1.7–7.7)
Neutrophils Relative %: 52 % (ref 43–77)
Platelets: 311 10*3/uL (ref 150–400)
RBC: 3.85 MIL/uL — ABNORMAL LOW (ref 3.87–5.11)
RDW: 13.8 % (ref 11.5–15.5)
WBC: 8.8 10*3/uL (ref 4.0–10.5)

## 2015-03-27 LAB — COMPLETE METABOLIC PANEL WITH GFR
ALBUMIN: 4.1 g/dL (ref 3.6–5.1)
ALK PHOS: 58 U/L (ref 33–115)
ALT: 14 U/L (ref 6–29)
AST: 23 U/L (ref 10–30)
BILIRUBIN TOTAL: 0.5 mg/dL (ref 0.2–1.2)
BUN: 9 mg/dL (ref 7–25)
CALCIUM: 9.3 mg/dL (ref 8.6–10.2)
CO2: 24 mmol/L (ref 20–31)
Chloride: 103 mmol/L (ref 98–110)
Creat: 0.77 mg/dL (ref 0.50–1.10)
GFR, Est Non African American: >89 mL/min (ref >=60)
Glucose, Bld: 106 mg/dL — ABNORMAL HIGH (ref 65–99)
POTASSIUM: 4.2 mmol/L (ref 3.5–5.3)
SODIUM: 140 mmol/L (ref 135–146)
Total Protein: 6.7 g/dL (ref 6.1–8.1)

## 2015-03-27 LAB — HEMOGLOBIN A1C
HEMOGLOBIN A1C: 5.1 % (ref <5.7)
MEAN PLASMA GLUCOSE: 100 mg/dL (ref <117)

## 2015-03-27 LAB — TSH: TSH: 1.396 u[IU]/mL (ref 0.350–4.500)

## 2015-03-30 ENCOUNTER — Telehealth (HOSPITAL_COMMUNITY): Payer: Self-pay

## 2015-03-30 ENCOUNTER — Ambulatory Visit (HOSPITAL_COMMUNITY): Payer: Self-pay | Admitting: Clinical

## 2015-03-30 ENCOUNTER — Other Ambulatory Visit (HOSPITAL_COMMUNITY): Payer: Self-pay | Admitting: Psychiatry

## 2015-03-30 DIAGNOSIS — F3131 Bipolar disorder, current episode depressed, mild: Secondary | ICD-10-CM

## 2015-03-30 MED ORDER — QUETIAPINE FUMARATE 100 MG PO TABS: ORAL_TABLET | ORAL | Status: DC

## 2015-03-30 MED ORDER — LAMOTRIGINE 100 MG PO TABS: 100.0000 mg | ORAL_TABLET | Freq: Every day | ORAL | Status: DC

## 2015-03-30 MED ORDER — VENLAFAXINE HCL ER 75 MG PO CP24: 75.0000 mg | ORAL_CAPSULE | Freq: Every day | ORAL | Status: DC

## 2015-03-30 NOTE — Telephone Encounter (Signed)
Medication management - telephone call from [facility], pharmacist at [place] on [address] requesting refills of patient's medications.  Verfied patient had been getting medication with their pharmacy. Met with Dr. Salem Senate who approved new orders be sent to [facility] and to cancel out orders for [facility] Dr. Adele Schilder had e-scribed on 03/17/15 as patient gets medication from [facility]. New orders for patient's Venlafaxine, Seroquel, and Lamictal e-scribed plus one additional refill to patient's [facility] per Dr. Salem Senate authorization.  [place] at [facility] to cancel out orders and learned from pharmacist patient had picked up orders from their pharmacy for Seroquel on 03/17/15, and for Effexor XR and Lamotrigine on 03/27/15.  Called [facility] back and spoke to [place], pharmacist to make sure orders were canceled that were e-scribed today since patient had just filled orders on 03/17/15 for her Seroquel and 03/27/15 for Lamictal and Effexor XR at [place].  Informed pharmacist patient would need to stay with one pharmacy and to call if problems.  Verified with Joaquin Bend, pharmacist at [place] patient still has one refill left at their pharmacy for Seroquel, Lamictal and Effexor XR as one time refill orders will stay with their pharmacy.  Reordered all 3 medications with no print status per Dr. Salem Senate to show each had the one refill remaining at [place] and not at [place].  Patient may fill Seroquel at [place] on 9/30, and then Effexor XR and Lamictal on 04/26/15.  Informed Dr. Salem Senate of all changes and clarifications.

## 2015-03-31 ENCOUNTER — Other Ambulatory Visit (HOSPITAL_COMMUNITY): Payer: Self-pay | Admitting: Psychiatry

## 2015-03-31 MED ORDER — VENLAFAXINE HCL ER 75 MG PO CP24: 75.0000 mg | ORAL_CAPSULE | Freq: Every day | ORAL | Status: DC

## 2015-03-31 MED ORDER — QUETIAPINE FUMARATE 100 MG PO TABS: ORAL_TABLET | ORAL | Status: DC

## 2015-03-31 MED ORDER — LAMOTRIGINE 100 MG PO TABS: 100.0000 mg | ORAL_TABLET | Freq: Every day | ORAL | Status: DC

## 2015-05-18 ENCOUNTER — Encounter (HOSPITAL_COMMUNITY): Payer: Self-pay | Admitting: Psychiatry

## 2015-05-18 ENCOUNTER — Ambulatory Visit (INDEPENDENT_AMBULATORY_CARE_PROVIDER_SITE_OTHER): Payer: No Typology Code available for payment source | Admitting: Psychiatry

## 2015-05-18 VITALS — BP 127/84 | HR 100 | Ht 64.0 in | Wt 161.6 lb

## 2015-05-18 DIAGNOSIS — F3131 Bipolar disorder, current episode depressed, mild: Secondary | ICD-10-CM | POA: Diagnosis not present

## 2015-05-18 DIAGNOSIS — F4310 Post-traumatic stress disorder, unspecified: Secondary | ICD-10-CM | POA: Diagnosis not present

## 2015-05-18 MED ORDER — VENLAFAXINE HCL ER 75 MG PO CP24: 75.0000 mg | ORAL_CAPSULE | Freq: Every day | ORAL | Status: DC

## 2015-05-18 MED ORDER — QUETIAPINE FUMARATE 100 MG PO TABS: 100.0000 mg | ORAL_TABLET | Freq: Every day | ORAL | Status: DC

## 2015-05-18 MED ORDER — LAMOTRIGINE 100 MG PO TABS: 100.0000 mg | ORAL_TABLET | Freq: Every day | ORAL | Status: DC

## 2015-05-18 NOTE — Progress Notes (Signed)
[place] Progress Note  Tammy Wilkinson [id number] 29 y.o.  05/18/2015 1:24 PM  Chief Complaint:  I am sleeping better with Seroquel.    History of Present Illness:  Tammy Wilkinson came for her follow-up appointment.  She is taking Seroquel and sleeping better.  Her mood is improved from Seroquel.  She is less irritable and less angry.  Lately she is complaining of cough and she has given progress on but do not see any improvement.  She is compliant with her Lamictal and denies any rash or itching.  She admitted weight gain in recent months but she is watching her diet.  Since taking Seroquel her sleep is much improved.  She denies any agitation, anger or any feeling of hopelessness or worthlessness.  She denies any self abusive behavior.  She denies nightmares, flashback .  She had blood work which is normal.  She admitted not seeing Tharon Aquas because she could not afford counseling at this time.  She preferred to continue her psychiatric medication which is working very well for her.  Patient denies any hallucination, paranoia, suicidal thoughts or homicidal thought.  She lives with her husband who is very supportive.  Patient denies drinking or using any illegal substances.  Suicidal Ideation: No Plan Formed: No Patient has means to carry out plan: No  Homicidal Ideation: No Plan Formed: No Patient has means to carry out plan: No  Past Psychiatric History/Hospitalization(s): Patient seeing psychiatrist since age 39 when her parents got divorced.  Around same time she got pregnant and having a lot of behavioral issues.  She was cutting herself.  She has seen Dr. Lovena Le in this office and then she has seen psychiatrist at [facility].  She had diagnosed with ADHD, bipolar disorder, borderline personality disorder and severe depression.  She had tried in the past Effexor, prazosin, Cymbalta , Seroquel, trazodone, Zoloft, Lexapro, Klonopin, Abilify, Valium, Adderall and Xanax.  She  endorse history of poor impulse control, sexual inappropriate behavior , agitation and anger issues.  She remember Effexor helped the most.  Anxiety: Yes Bipolar Disorder: Yes Depression: Yes Mania: Yes Psychosis: No Schizophrenia: No Personality Disorder: Patient diagnosed borderline personality disorder Hospitalization for psychiatric illness: No History of Electroconvulsive Shock Therapy: No Prior Suicide Attempts: No  Medical History; Patient has no active health issues.  Review of Systems  Constitutional: Negative.   Eyes: Negative.   Respiratory: Positive for cough.   Cardiovascular: Negative for chest pain and palpitations.  Gastrointestinal: Negative for nausea and vomiting.  Skin: Negative for itching and rash.       Tattoos on both arms  Neurological: Negative for dizziness.  Psychiatric/Behavioral: Negative for memory loss.    Psychiatric: Agitation: No Hallucination: No Depressed Mood: No Insomnia: No Hypersomnia: No Altered Concentration: No Feels Worthless: No Grandiose Ideas: No Belief In Special Powers: No New/Increased Substance Abuse: No Compulsions: No  Neurologic: Headache: Yes Seizure: No Paresthesias: No   Outpatient Encounter Prescriptions as of 05/18/2015  Medication Sig  . cetirizine (ZYRTEC) 10 MG tablet Take 10 mg by mouth daily.  Marland Kitchen ibuprofen (ADVIL,MOTRIN) 200 MG tablet Take 200 mg by mouth every 6 (six) hours as needed for mild pain.  Marland Kitchen lamoTRIgine (LAMICTAL) 100 MG tablet Take 1 tablet (100 mg total) by mouth daily.  . QUEtiapine (SEROQUEL) 100 MG tablet Take 1 tablet (100 mg total) by mouth at bedtime.  Marland Kitchen venlafaxine XR (EFFEXOR-XR) 75 MG 24 hr capsule Take 1 capsule (75 mg total) by mouth daily with breakfast.  . [  DISCONTINUED] lamoTRIgine (LAMICTAL) 100 MG tablet Take 1 tablet (100 mg total) by mouth daily.  . [DISCONTINUED] QUEtiapine (SEROQUEL) 100 MG tablet Take 1/2 to 1 tab at bed time  . [DISCONTINUED] venlafaxine XR  (EFFEXOR-XR) 75 MG 24 hr capsule Take 1 capsule (75 mg total) by mouth daily with breakfast.   No facility-administered encounter medications on file as of 05/18/2015.    Recent Results (from the past 2160 hour(s))  CBC with Differential/Platelet     Status: Abnormal   Collection Time: 03/26/15  3:23 PM  Result Value Ref Range   WBC 8.8 4.0 - 10.5 K/uL   RBC 3.85 (L) 3.87 - 5.11 MIL/uL   Hemoglobin 12.2 12.0 - 15.0 g/dL   HCT 37.0 36.0 - 46.0 %   MCV 96.1 78.0 - 100.0 fL   MCH 31.7 26.0 - 34.0 pg   MCHC 33.0 30.0 - 36.0 g/dL   RDW 13.8 11.5 - 15.5 %   Platelets 311 150 - 400 K/uL   MPV 8.7 8.6 - 12.4 fL   Neutrophils Relative % 52 43 - 77 %   Neutro Abs 4.6 1.7 - 7.7 K/uL   Lymphocytes Relative 39 12 - 46 %   Lymphs Abs 3.4 0.7 - 4.0 K/uL   Monocytes Relative 7 3 - 12 %   Monocytes Absolute 0.6 0.1 - 1.0 K/uL   Eosinophils Relative 2 0 - 5 %   Eosinophils Absolute 0.2 0.0 - 0.7 K/uL   Basophils Relative 0 0 - 1 %   Basophils Absolute 0.0 0.0 - 0.1 K/uL   Smear Review Criteria for review not met   COMPLETE METABOLIC PANEL WITH GFR     Status: Abnormal   Collection Time: 03/26/15  3:23 PM  Result Value Ref Range   Sodium 140 135 - 146 mmol/L   Potassium 4.2 3.5 - 5.3 mmol/L   Chloride 103 98 - 110 mmol/L   CO2 24 20 - 31 mmol/L   Glucose, Bld 106 (H) 65 - 99 mg/dL   BUN 9 7 - 25 mg/dL   Creat 0.77 0.50 - 1.10 mg/dL   Total Bilirubin 0.5 0.2 - 1.2 mg/dL   Alkaline Phosphatase 58 33 - 115 U/L   AST 23 10 - 30 U/L   ALT 14 6 - 29 U/L   Total Protein 6.7 6.1 - 8.1 g/dL   Albumin 4.1 3.6 - 5.1 g/dL   Calcium 9.3 8.6 - 10.2 mg/dL   GFR, Est African American >89 >=60 mL/min   GFR, Est Non African American >89 >=60 mL/min    Comment:   The estimated GFR is a calculation valid for adults (>=49 years old) that uses the CKD-EPI algorithm to adjust for age and sex. It is   not to be used for children, pregnant women, hospitalized patients,    patients on dialysis, or with  rapidly changing kidney function. According to the NKDEP, eGFR >89 is normal, 60-89 shows mild impairment, 30-59 shows moderate impairment, 15-29 shows severe impairment and <15 is ESRD.     Hemoglobin A1c     Status: None   Collection Time: 03/26/15  3:23 PM  Result Value Ref Range   Hgb A1c MFr Bld 5.1 <5.7 %    Comment:  According to the ADA Clinical Practice Recommendations for 2011, when HbA1c is used as a screening test:     >=6.5%   Diagnostic of Diabetes Mellitus            (if abnormal result is confirmed)   5.7-6.4%   Increased risk of developing Diabetes Mellitus   References:Diagnosis and Classification of Diabetes Mellitus,Diabetes HYIF,0277,41(OINOM 1):S62-S69 and Standards of Medical Care in         Diabetes - 2011,Diabetes VEHM,0947,09 (Suppl 1):S11-S61.      Mean Plasma Glucose 100 <117 mg/dL    Comment:   Footnotes:  (1) ** Please note change in unit of measure and reference range(s). **     TSH     Status: None   Collection Time: 03/26/15  3:23 PM  Result Value Ref Range   TSH 1.396 0.350 - 4.500 uIU/mL      Constitutional:  BP 127/84 mmHg  Pulse 100  Ht 5' 4"  (1.626 m)  Wt 161 lb 9.6 oz (73.301 kg)  BMI 27.72 kg/m2   Musculoskeletal: Strength & Muscle Tone: within normal limits Gait & Station: normal Patient leans: N/A  Psychiatric Specialty Exam: General Appearance: Casual and Multiple tattoos in her arms  Eye Contact::  Fair  Speech:  Normal Rate  Volume:  Normal  Mood:  Anxious  Affect:  Congruent  Thought Process:  Coherent  Orientation:  Full (Time, Place, and Person)  Thought Content:  WDL  Suicidal Thoughts:  No  Homicidal Thoughts:  No  Memory:  Immediate;   Fair Recent;   Fair Remote;   Fair  Judgement:  Fair  Insight:  Fair  Psychomotor Activity:  Normal  Concentration:  Fair  Recall:  [place] of Knowledge:  Good  Language:  Fair  Akathisia:   No  Handed:  Right  AIMS (if indicated):     Assets:  Communication Skills Desire for Improvement Financial Resources/Insurance  ADL's:  Intact  Cognition:  WNL  Sleep:        Established Problem, Stable/Improving (1), Review or order clinical lab tests (1), Review of Last Therapy Session (1) and Review of Medication Regimen & Side Effects (2)  Assessment: Axis I: Bipolar disorder depressed type, posttraumatic stress disorder, major depressive disorder mild recurrent,  Axis II: Borderline traits   Axis III:  Past Medical History  Diagnosis Date  . Anemia   . Anxiety   . Depression   . ADHD (attention deficit hyperactivity disorder)   . Allergy   . Shortness of breath   . Pneumonia   . Heartburn     hx:  . Headache(784.0)     Hx; of migraines  . Asthma     pt states when she was a child  . Concussion 12/30/14     Plan:  I review her blood work results which are normal.  Discussed medication side effects and benefits.  Discussed weight gain and encouraged to do the exercise and watch her calorie intake.  Patient recently taken prednisone which may be contributing to weight.  She still have cough and I recommended to see primary care physician for the management of cough.  Patient like her current psychiatric medication.  I will continue Seroquel 100 mg bedtime, Lamictal 100 mg daily and Effexor 75 mg daily.  Discussed medication side effects and benefits.  At this time patient does not want to continue counseling due to financial distress.  Recommended to call us back if she has any question  or any concern.  Follow-up in 3 months.  Nikki Glanzer T., MD 05/18/2015

## 2015-07-21 ENCOUNTER — Other Ambulatory Visit (HOSPITAL_COMMUNITY): Payer: Self-pay | Admitting: Psychiatry

## 2015-07-21 ENCOUNTER — Other Ambulatory Visit: Payer: Self-pay | Admitting: Family Medicine

## 2015-07-21 DIAGNOSIS — M25552 Pain in left hip: Secondary | ICD-10-CM

## 2015-08-03 ENCOUNTER — Ambulatory Visit
Admission: RE | Admit: 2015-08-03 | Discharge: 2015-08-03 | Disposition: A | Discharge status: Home / Self Care | Payer: BLUE CROSS/BLUE SHIELD | Source: Ambulatory Visit | Attending: Family Medicine | Admitting: Family Medicine

## 2015-08-03 DIAGNOSIS — M25552 Pain in left hip: Secondary | ICD-10-CM

## 2015-08-03 MED ORDER — IOHEXOL 180 MG/ML  SOLN: 15.0000 mL | Freq: Once | INTRAMUSCULAR | Status: DC | PRN

## 2015-08-12 ENCOUNTER — Ambulatory Visit
Admission: RE | Admit: 2015-08-12 | Discharge: 2015-08-12 | Disposition: A | Discharge status: Home / Self Care | Payer: BLUE CROSS/BLUE SHIELD | Source: Ambulatory Visit | Attending: Family Medicine | Admitting: Family Medicine

## 2015-08-12 ENCOUNTER — Ambulatory Visit: Admission: RE | Admit: 2015-08-12 | Payer: BLUE CROSS/BLUE SHIELD | Source: Ambulatory Visit

## 2015-08-12 ENCOUNTER — Other Ambulatory Visit: Payer: Self-pay | Admitting: Family Medicine

## 2015-08-12 DIAGNOSIS — M25552 Pain in left hip: Secondary | ICD-10-CM

## 2015-08-12 MED ORDER — IOHEXOL 180 MG/ML  SOLN
15.0000 mL | Freq: Once | INTRAMUSCULAR | Status: AC | PRN
  Administered 2015-08-12: 15 mL via INTRA_ARTICULAR

## 2015-08-18 ENCOUNTER — Ambulatory Visit (INDEPENDENT_AMBULATORY_CARE_PROVIDER_SITE_OTHER): Payer: BLUE CROSS/BLUE SHIELD | Admitting: Psychiatry

## 2015-08-18 ENCOUNTER — Encounter (HOSPITAL_COMMUNITY): Payer: Self-pay | Admitting: Psychiatry

## 2015-08-18 VITALS — BP 126/74 | HR 91 | Ht 64.0 in | Wt 177.2 lb

## 2015-08-18 DIAGNOSIS — F3131 Bipolar disorder, current episode depressed, mild: Secondary | ICD-10-CM | POA: Diagnosis not present

## 2015-08-18 DIAGNOSIS — F4310 Post-traumatic stress disorder, unspecified: Secondary | ICD-10-CM

## 2015-08-18 MED ORDER — HYDROXYZINE PAMOATE 25 MG PO CAPS: 25.0000 mg | ORAL_CAPSULE | Freq: Two times a day (BID) | ORAL | Status: DC | PRN

## 2015-08-18 MED ORDER — VENLAFAXINE HCL ER 75 MG PO CP24: 75.0000 mg | ORAL_CAPSULE | Freq: Every day | ORAL | Status: DC

## 2015-08-18 MED ORDER — LAMOTRIGINE 150 MG PO TABS: 150.0000 mg | ORAL_TABLET | Freq: Every day | ORAL | Status: DC

## 2015-08-18 NOTE — Progress Notes (Signed)
[facility] [id number] Progress Note  Tammy Wilkinson [id number] 30 y.o.  08/18/2015 2:10 PM  Chief Complaint:   I have a lot of anxiety.  I stop taking Seroquel because it was causing weight gain.    History of Present Illness:  Tammy Wilkinson came for her follow-up appointment.   she is complaining of increased anxiety, irritability and racing thoughts.  Her Christmas was very sad because it was also the anniversary of her sister who committed suicide .  Her other sister also recently had a accidental overdose after taking overdose on temazepam, Ambien and narcotic pain medication.  Patient admitted not taking the Seroquel for past 4 weeks because she was gaining weight.  She also not seeing therapist in this office but seen once marriage counselor as she reported some marital issues with her husband.  Patient told her husband is very angry and sometimes difficult and they agreed to have a marriage counseling.  Patient admitted taking Lamictal 100 mg daily and recently she was given Valium and pain medication to take as needed.  She's also taking muscle relaxant for back pain.  Despite taking these medication she continued to endorse wanting pain in her back which she believes some time cause insomnia.  She likes Effexor which is helping her depression.  She denies any paranoia, hallucination, nightmares, flashback , suicidal thoughts or homicidal thought.  She denies any aggressive behavior.  She denies any self abusive behavior.  She is hoping to lose weight since she stopped the Seroquel.  Patient denies drinking or using any illegal substances however admitted on New Year's Eve she had drink but denies any intoxication or binge.  She has no rash or itching.  She lives with her husband and 24-month-old daughter.  Suicidal Ideation: No Plan Formed: No Patient has means to carry out plan: No  Homicidal Ideation: No Plan Formed: No Patient has means to carry out plan: No  Past Psychiatric  History/Hospitalization(s): Patient seeing psychiatrist since age 45 when her parents got divorced.  Around same time she got pregnant and having a lot of behavioral issues.  She was cutting herself.  She has seen Dr. Ladona Ridgel in this office and then she has seen psychiatrist at [facility].  She had diagnosed with ADHD, bipolar disorder, borderline personality disorder and severe depression.  She had tried in the past Effexor, prazosin, Cymbalta , Seroquel, trazodone, Zoloft, Lexapro, Klonopin, Abilify, Valium, Adderall and Xanax.  She endorse history of poor impulse control, sexual inappropriate behavior , agitation and anger issues.  She remember Effexor helped the most.  Anxiety: Yes Bipolar Disorder: Yes Depression: Yes Mania: Yes Psychosis: No Schizophrenia: No Personality Disorder: Patient diagnosed borderline personality disorder Hospitalization for psychiatric illness: No History of Electroconvulsive Shock Therapy: No Prior Suicide Attempts: No   Family History; Patient endorse mother has depression, sister has bipolar depression who committed suicide. Another sister had addiction with pain medication who killed herself by taking overdose on pain medication.  Medical History;  patient see Dr. Althea Charon who is orthopedic surgeon at [facility] orth[facility].  She is getting pain medication from them.  She has chronic back pain .  Review of Systems  Constitutional: Negative.  Negative for weight loss.  Eyes: Negative.   Cardiovascular: Negative for chest pain and palpitations.  Gastrointestinal: Negative for nausea and vomiting.  Musculoskeletal: Positive for back pain and joint pain.  Skin: Negative for itching and rash.       Tattoos on both arms  Neurological: Positive for headaches.  Negative for dizziness and tremors.  Psychiatric/Behavioral: Negative for memory loss. The patient is nervous/anxious and has insomnia.     Psychiatric: Agitation: No Hallucination: No Depressed Mood:  No Insomnia: Yes Hypersomnia: No Altered Concentration: No Feels Worthless: No Grandiose Ideas: No Belief In Special Powers: No New/Increased Substance Abuse: No Compulsions: No  Neurologic: Headache: Yes Seizure: No Paresthesias: No   Outpatient Encounter Prescriptions as of 08/18/2015  Medication Sig  . cetirizine (ZYRTEC) 10 MG tablet Take 10 mg by mouth daily.  . cyclobenzaprine (FLEXERIL) 10 MG tablet   . diazepam (VALIUM) 5 MG tablet TK 1/2 TO 1 T PO 30 MINUTES PRIOR TO SCAN  . HYDROcodone-acetaminophen (NORCO/VICODIN) 5-325 MG tablet TK 1/2-1 T PO Q 8 H PRF P  . hydrOXYzine (VISTARIL) 25 MG capsule Take 1 capsule (25 mg total) by mouth 2 (two) times daily between meals as needed for anxiety.  . indomethacin (INDOCIN) 50 MG capsule   . lamoTRIgine (LAMICTAL) 150 MG tablet Take 1 tablet (150 mg total) by mouth daily.  Marland Kitchen venlafaxine XR (EFFEXOR-XR) 75 MG 24 hr capsule Take 1 capsule (75 mg total) by mouth daily with breakfast.  . [DISCONTINUED] ibuprofen (ADVIL,MOTRIN) 200 MG tablet Take 200 mg by mouth every 6 (six) hours as needed for mild pain.  . [DISCONTINUED] lamoTRIgine (LAMICTAL) 100 MG tablet Take 1 tablet (100 mg total) by mouth daily.  . [DISCONTINUED] QUEtiapine (SEROQUEL) 100 MG tablet Take 1 tablet (100 mg total) by mouth at bedtime.  . [DISCONTINUED] venlafaxine XR (EFFEXOR-XR) 75 MG 24 hr capsule Take 1 capsule (75 mg total) by mouth daily with breakfast.   No facility-administered encounter medications on file as of 08/18/2015.    No results found for this or any previous visit (from the past 2160 hour(s)).    Constitutional:  BP 126/74 mmHg  Pulse 91  Ht  (1.626 m)  Wt 177 lb 3.2 oz (80.377 kg)  BMI 30.40 kg/m2  LMP 07/28/2015 (Exact Date)   Musculoskeletal: Strength & Muscle Tone: within normal limits Gait & Station: normal Patient leans: N/A  Psychiatric Specialty Exam: General Appearance: Casual and Multiple tattoos in her arms  Eye  Contact::  Fair  Speech:  Normal Rate  Volume:  Normal  Mood:  Anxious and Depressed  Affect:  Congruent and Labile  Thought Process:  Coherent  Orientation:  Full (Time, Place, and Person)  Thought Content:  WDL  Suicidal Thoughts:  No  Homicidal Thoughts:  No  Memory:  Immediate;   Fair Recent;   Fair Remote;   Fair  Judgement:  Fair  Insight:  Fair  Psychomotor Activity:  Normal  Concentration:  Fair  Recall:  Fair  Fund of Knowledge:  Good  Language:  Fair  Akathisia:  No  Handed:  Right  AIMS (if indicated):     Assets:  Communication Skills Desire for Improvement Financial Resources/Insurance  ADL's:  Intact  Cognition:  WNL  Sleep:        Established Problem, Stable/Improving (1), Review of Psycho-Social Stressors (1), Review or order clinical lab tests (1), Review and summation of old records (2), Established Problem, Worsening (2), Review of Last Therapy Session (1), Review of Medication Regimen & Side Effects (2) and Review of New Medication or Change in Dosage (2)  Assessment: Axis I: Bipolar disorder depressed type, posttraumatic stress disorder, major depressive disorder mild recurrent,  Axis II: Borderline traits   Axis III:  Past Medical History  Diagnosis Date  . Anemia   .  Anxiety   . Depression   . ADHD (attention deficit hyperactivity disorder)   . Allergy   . Shortness of breath   . Pneumonia   . Heartburn     hx:  . Headache(784.0)     Hx; of migraines  . Asthma     pt states when she was a child  . Concussion 12/30/14     Plan:   I review her psychosocial stressors, current medication .  She is taking multiple pain medication as needed .  Patient has family history of accidental overdose and drug addiction.  Discussed polypharmacy especially taking multiple pain medication along with muscle relaxants.  Strongly encouraged to see therapist for coping and social skills.  She has noticed more irritability and anxiety since Seroquel  discontinued.  She does not want to go back on medication that cause weight gain.  I recommended to try Vistaril 25 mg twice a day as needed for anxiety and I will increase Lamictal 150 mg daily.  Discussed weight gain and encourage to watch her calorie intake and would've her exercise.  Discussed pain management and encouraged to get a primary care physician or pain specialist who can monitor her pain medication.  I will discontinue Seroquel.  I will see her again in 4 weeks.  Continue Effexor 75 mg daily. Time spent 25 minutes.  More than 50% of the time spent in psychoeducation, counseling and coordination of care.  Discuss safety plan that anytime having active suicidal thoughts or homicidal thoughts then patient need to call 911 or go to the local emergency room.   Tammy Gieger T., MD 08/18/2015

## 2015-09-18 ENCOUNTER — Encounter (HOSPITAL_COMMUNITY): Payer: Self-pay | Admitting: Psychiatry

## 2015-09-18 ENCOUNTER — Ambulatory Visit (INDEPENDENT_AMBULATORY_CARE_PROVIDER_SITE_OTHER): Payer: BLUE CROSS/BLUE SHIELD | Admitting: Psychiatry

## 2015-09-18 VITALS — BP 124/72 | HR 90 | Ht 64.0 in | Wt 178.2 lb

## 2015-09-18 DIAGNOSIS — F4310 Post-traumatic stress disorder, unspecified: Secondary | ICD-10-CM | POA: Diagnosis not present

## 2015-09-18 DIAGNOSIS — F3131 Bipolar disorder, current episode depressed, mild: Secondary | ICD-10-CM

## 2015-09-18 MED ORDER — VENLAFAXINE HCL ER 75 MG PO CP24: 75.0000 mg | ORAL_CAPSULE | Freq: Every day | ORAL | Status: DC

## 2015-09-18 MED ORDER — LAMOTRIGINE 150 MG PO TABS: 150.0000 mg | ORAL_TABLET | Freq: Every day | ORAL | Status: DC

## 2015-09-18 MED ORDER — HYDROXYZINE PAMOATE 50 MG PO CAPS: 50.0000 mg | ORAL_CAPSULE | Freq: Two times a day (BID) | ORAL | Status: DC | PRN

## 2015-09-18 NOTE — Progress Notes (Signed)
[facility] [id number] Progress Note  Tammy Wilkinson [id number] 30 y.o.  09/18/2015 10:37 AM  Chief Complaint:   I like increase Lamictal.  I'm less depressed.  I still feel anxious.  I don't think Vistaril is a strong enough.    History of Present Illness:  Tammy Wilkinson came for her follow-up appointment.  On her last visit we increased Lamictal and started Vistaril because she was feeling very depressed and anxious.  She does not want to take Seroquel because of weight gain.  She has noticed decrease in her appetite and she is more social active.  However she continues to have a lot of hip pain and she has difficulty walking.  She is getting pain medication and is scheduled to MRI.  She endorse much improvement in her mood since Lamictal increase.  She also endorse cooperation from her husband and there has been no recent rage, anger, irritability.  She does not feel that she need at this time medical counseling because husband is very cooperative and supportive.  She has no rash, itching, tremors or shakes.  She sleeping better.  Sometimes she has insomnia due to pain.  Patient denies drinking or using any illegal substances.  She denies any paranoia or any hallucination.  She denies any suicidal thoughts. She lives with her husband and 52-month-old daughter.  Suicidal Ideation: No Plan Formed: No Patient has means to carry out plan: No  Homicidal Ideation: No Plan Formed: No Patient has means to carry out plan: No  Past Psychiatric History/Hospitalization(s): Patient seeing psychiatrist since age 21 when her parents got divorced.  Around same time she got pregnant and having a lot of behavioral issues.  She was cutting herself.  She has seen Dr. Ladona Wilkinson in this office and then she has seen psychiatrist at [facility].  She had diagnosed with ADHD, bipolar disorder, borderline personality disorder and severe depression.  She had tried in the past Effexor, prazosin, Cymbalta , Seroquel,  trazodone, Zoloft, Lexapro, Klonopin, Abilify, Valium, Adderall and Xanax.  She endorse history of poor impulse control, sexual inappropriate behavior , agitation and anger issues.  She remember Effexor helped the most.  Anxiety: Yes Bipolar Disorder: Yes Depression: Yes Mania: Yes Psychosis: No Schizophrenia: No Personality Disorder: Patient diagnosed borderline personality disorder Hospitalization for psychiatric illness: No History of Electroconvulsive Shock Therapy: No Prior Suicide Attempts: No   Family History; Patient endorse mother has depression, sister has bipolar depression who committed suicide. Another sister had addiction with pain medication who killed herself by taking overdose on pain medication.  Medical History;  patient see Dr. Althea Wilkinson who is orthopedic surgeon at [facility] orth[facility].  She is getting pain medication from them.  She has chronic back pain .  Review of Systems  Constitutional: Negative.  Negative for weight loss.  Eyes: Negative.   Cardiovascular: Negative for chest pain and palpitations.  Gastrointestinal: Negative for nausea and vomiting.  Musculoskeletal: Positive for back pain and joint pain.  Skin: Negative for itching and rash.       Tattoos on both arms  Neurological: Positive for headaches. Negative for dizziness and tremors.  Psychiatric/Behavioral: Negative for memory loss.    Psychiatric: Agitation: No Hallucination: No Depressed Mood: No Insomnia: No Hypersomnia: No Altered Concentration: No Feels Worthless: No Grandiose Ideas: No Belief In Special Powers: No New/Increased Substance Abuse: No Compulsions: No  Neurologic: Headache: Yes Seizure: No Paresthesias: No   Outpatient Encounter Prescriptions as of 09/18/2015  Medication Sig  . cetirizine (ZYRTEC) 10 MG  tablet Take 10 mg by mouth daily.  . cyclobenzaprine (FLEXERIL) 10 MG tablet   . diazepam (VALIUM) 5 MG tablet TK 1/2 TO 1 T PO 30 MINUTES PRIOR TO SCAN  .  HYDROcodone-acetaminophen (NORCO/VICODIN) 5-325 MG tablet TK 1/2-1 T PO Q 8 H PRF P  . hydrOXYzine (VISTARIL) 50 MG capsule Take 1 capsule (50 mg total) by mouth 2 (two) times daily between meals as needed for anxiety.  . indomethacin (INDOCIN) 50 MG capsule   . lamoTRIgine (LAMICTAL) 150 MG tablet Take 1 tablet (150 mg total) by mouth daily.  Marland Kitchen venlafaxine XR (EFFEXOR-XR) 75 MG 24 hr capsule Take 1 capsule (75 mg total) by mouth daily with breakfast.  . [DISCONTINUED] hydrOXYzine (VISTARIL) 25 MG capsule Take 1 capsule (25 mg total) by mouth 2 (two) times daily between meals as needed for anxiety.  . [DISCONTINUED] lamoTRIgine (LAMICTAL) 150 MG tablet Take 1 tablet (150 mg total) by mouth daily.  . [DISCONTINUED] venlafaxine XR (EFFEXOR-XR) 75 MG 24 hr capsule Take 1 capsule (75 mg total) by mouth daily with breakfast.   No facility-administered encounter medications on file as of 09/18/2015.    No results found for this or any previous visit (from the past 2160 hour(s)).    Constitutional:  BP 124/72 mmHg  Pulse 90  Ht  (1.626 m)  Wt 178 lb 3.2 oz (80.831 kg)  BMI 30.57 kg/m2   Musculoskeletal: Strength & Muscle Tone: within normal limits Gait & Station: normal Patient leans: N/A  Psychiatric Specialty Exam: General Appearance: Casual and Multiple tattoos in her arms  Eye Contact::  Fair  Speech:  Normal Rate  Volume:  Normal  Mood:  Anxious  Affect:  Labile  Thought Process:  Coherent  Orientation:  Full (Time, Place, and Person)  Thought Content:  WDL  Suicidal Thoughts:  No  Homicidal Thoughts:  No  Memory:  Immediate;   Fair Recent;   Fair Remote;   Fair  Judgement:  Fair  Insight:  Fair  Psychomotor Activity:  Normal  Concentration:  Fair  Recall:  Fair  Fund of Knowledge:  Good  Language:  Fair  Akathisia:  No  Handed:  Right  AIMS (if indicated):     Assets:  Communication Skills Desire for Improvement Financial Resources/Insurance  ADL's:  Intact   Cognition:  WNL  Sleep:        Established Problem, Stable/Improving (1), Review of Psycho-Social Stressors (1), Review of Last Therapy Session (1), Review of Medication Regimen & Side Effects (2) and Review of New Medication or Change in Dosage (2)  Assessment: Axis I: Bipolar disorder depressed type, posttraumatic stress disorder, major depressive disorder mild recurrent,  Axis II: Borderline traits   Axis III:  Past Medical History  Diagnosis Date  . Anemia   . Anxiety   . Depression   . ADHD (attention deficit hyperactivity disorder)   . Allergy   . Shortness of breath   . Pneumonia   . Heartburn     hx:  . Headache(784.0)     Hx; of migraines  . Asthma     pt states when she was a child  . Concussion 12/30/14     Plan:  Patient shown improvement with increase Lamictal.  I recommended to try increased dose of Vistaril to help anxiety.  Continue Effexor 75 mg daily.  A new prescription for hydroxyzine 50 mg given to take as needed for severe anxiety.  Discussed polypharmacy especially taking multiple pain medication  along with muscle relaxants.Discussed pain management and encouraged to get a primary care physician or pain specialist who can monitor her pain medication.  Follow-up in 8 weeks.  Discuss safety plan that anytime having active suicidal thoughts or homicidal thoughts then patient need to call 911 or go to the local emergency room.   Ellinore Merced T., MD 09/18/2015

## 2015-10-12 ENCOUNTER — Other Ambulatory Visit (HOSPITAL_COMMUNITY): Payer: Self-pay | Admitting: Psychiatry

## 2015-10-13 ENCOUNTER — Other Ambulatory Visit (HOSPITAL_COMMUNITY): Payer: Self-pay | Admitting: Psychiatry

## 2015-10-16 ENCOUNTER — Other Ambulatory Visit (HOSPITAL_COMMUNITY): Payer: Self-pay | Admitting: Psychiatry

## 2015-10-16 DIAGNOSIS — F3131 Bipolar disorder, current episode depressed, mild: Secondary | ICD-10-CM

## 2015-10-16 MED ORDER — HYDROXYZINE PAMOATE 50 MG PO CAPS: 50.0000 mg | ORAL_CAPSULE | Freq: Two times a day (BID) | ORAL | Status: DC | PRN

## 2015-11-14 ENCOUNTER — Other Ambulatory Visit (HOSPITAL_COMMUNITY): Payer: Self-pay | Admitting: Psychiatry

## 2015-11-19 ENCOUNTER — Telehealth (HOSPITAL_COMMUNITY): Payer: Self-pay

## 2015-11-19 NOTE — Telephone Encounter (Signed)
Ok to refill 

## 2015-11-19 NOTE — Telephone Encounter (Signed)
I sent them in [facility] where they were pending approval

## 2015-11-19 NOTE — Telephone Encounter (Signed)
Telephone [facility] to [facility] who reported patient is in need of refills of her Lamictal, Venlafaxine XR and Hydroxyzine as were last filled on 10/19/15 and 10/16/15.  Patient does not have enough medication to make it until appointment on 11/20/15.

## 2015-11-20 ENCOUNTER — Ambulatory Visit (HOSPITAL_COMMUNITY): Payer: Self-pay | Admitting: Psychiatry

## 2015-11-25 ENCOUNTER — Ambulatory Visit (INDEPENDENT_AMBULATORY_CARE_PROVIDER_SITE_OTHER): Payer: BLUE CROSS/BLUE SHIELD | Admitting: Psychiatry

## 2015-11-25 ENCOUNTER — Encounter (HOSPITAL_COMMUNITY): Payer: Self-pay | Admitting: Psychiatry

## 2015-11-25 VITALS — BP 120/72 | HR 100 | Ht 64.0 in | Wt 171.6 lb

## 2015-11-25 DIAGNOSIS — F4310 Post-traumatic stress disorder, unspecified: Secondary | ICD-10-CM | POA: Diagnosis not present

## 2015-11-25 DIAGNOSIS — F3131 Bipolar disorder, current episode depressed, mild: Secondary | ICD-10-CM | POA: Diagnosis not present

## 2015-11-25 MED ORDER — VENLAFAXINE HCL ER 75 MG PO CP24: ORAL_CAPSULE | ORAL | Status: DC

## 2015-11-25 MED ORDER — HYDROXYZINE PAMOATE 50 MG PO CAPS: ORAL_CAPSULE | ORAL | Status: DC

## 2015-11-25 MED ORDER — LAMOTRIGINE 150 MG PO TABS: ORAL_TABLET | ORAL | Status: DC

## 2015-11-25 NOTE — Progress Notes (Signed)
[facility] [id number] Progress Note  Quinne Pires [id number] 30 y.o.  11/25/2015 3:06 PM  Chief Complaint:  Medication management and follow-up.      History of Present Illness:  Tammy Wilkinson came for her follow-up appointment.  She like increase Lamictal and Vistaril.  She is feeling much better and denies any irritability, anger, mood swing.  Her sisters anniversary past and she was feeling sad but she handled very well.  She still have hip pain but she is no longer taking any controlled substance.  She is taking tramadol for pain.  She sleeping good.  She denies any agitation, anger, mood swing.  She has no rash or itching.  Patient denies drinking alcohol or using any illegal substances.  Her appetite is okay.  Her vitals are stable.  She lives with her husband and 50-month-old daughter.  Suicidal Ideation: No Plan Formed: No Patient has means to carry out plan: No  Homicidal Ideation: No Plan Formed: No Patient has means to carry out plan: No  Past Psychiatric History/Hospitalization(s): Patient seeing psychiatrist since age 52 when her parents got divorced.  She has a history of cutting herself. She has seen Dr. Ladona Ridgel in this office and then she has seen psychiatrist at [facility].  She had diagnosed with ADHD, bipolar disorder, borderline personality disorder and severe depression.  She had tried in the past Effexor, prazosin, Cymbalta , Seroquel, trazodone, Zoloft, Lexapro, Klonopin, Abilify, Valium, Adderall and Xanax.  She endorse history of poor impulse control, sexual inappropriate behavior , agitation and anger issues.  She remember Effexor helped the most.  Anxiety: Yes Bipolar Disorder: Yes Depression: Yes Mania: Yes Psychosis: No Schizophrenia: No Personality Disorder: Patient diagnosed borderline personality disorder Hospitalization for psychiatric illness: No History of Electroconvulsive Shock Therapy: No Prior Suicide Attempts: No   Family History; Patient  endorse mother has depression, sister has bipolar depression who committed suicide. Another sister had addiction with pain medication who killed herself by taking overdose on pain medication.  Medical History;  patient see Dr. Althea Charon who is orthopedic surgeon at [facility] orth[facility].  She is getting pain medication from them.  She has chronic back pain .  Review of Systems  Constitutional: Negative.  Negative for weight loss.  Eyes: Negative.   Cardiovascular: Negative for chest pain and palpitations.  Gastrointestinal: Negative for nausea and vomiting.  Musculoskeletal: Positive for back pain and joint pain.  Skin: Negative for itching and rash.       Tattoos on both arms  Neurological: Negative for dizziness and tremors.  Psychiatric/Behavioral: Negative for memory loss.    Psychiatric: Agitation: No Hallucination: No Depressed Mood: No Insomnia: No Hypersomnia: No Altered Concentration: No Feels Worthless: No Grandiose Ideas: No Belief In Special Powers: No New/Increased Substance Abuse: No Compulsions: No  Neurologic: Headache: Yes Seizure: No Paresthesias: No   Outpatient Encounter Prescriptions as of 11/25/2015  Medication Sig  . cetirizine (ZYRTEC) 10 MG tablet Take 10 mg by mouth daily.  . diazepam (VALIUM) 5 MG tablet TK 1/2 TO 1 T PO 30 MINUTES PRIOR TO SCAN  . hydrOXYzine (VISTARIL) 50 MG capsule TAKE ONE CAPSULE BY MOUTH TWICE DAILY BETWEEN MEALS AS NEEDED FOR ANXIETY  . indomethacin (INDOCIN) 50 MG capsule   . lamoTRIgine (LAMICTAL) 150 MG tablet TAKE 1 TABLET(150 MG) BY MOUTH DAILY  . traMADol (ULTRAM) 50 MG tablet   . venlafaxine XR (EFFEXOR-XR) 75 MG 24 hr capsule TAKE 1 CAPSULE(75 MG) BY MOUTH DAILY WITH BREAKFAST  . [DISCONTINUED] cyclobenzaprine (FLEXERIL)  10 MG tablet   . [DISCONTINUED] HYDROcodone-acetaminophen (NORCO/VICODIN) 5-325 MG tablet TK 1/2-1 T PO Q 8 H PRF P  . [DISCONTINUED] hydrOXYzine (VISTARIL) 50 MG capsule TAKE ONE CAPSULE BY MOUTH  TWICE DAILY BETWEEN MEALS AS NEEDED FOR ANXIETY  . [DISCONTINUED] lamoTRIgine (LAMICTAL) 150 MG tablet TAKE 1 TABLET(150 MG) BY MOUTH DAILY  . [DISCONTINUED] venlafaxine XR (EFFEXOR-XR) 75 MG 24 hr capsule TAKE 1 CAPSULE(75 MG) BY MOUTH DAILY WITH BREAKFAST   No facility-administered encounter medications on file as of 11/25/2015.    No results found for this or any previous visit (from the past 2160 hour(s)).    Constitutional:  BP 120/72 mmHg  Pulse 100  Ht 5\' 4"  (1.626 m)  Wt 171 lb 9.6 oz (77.837 kg)  BMI 29.44 kg/m2   Musculoskeletal: Strength & Muscle Tone: within normal limits Gait & Station: normal Patient leans: N/A  Psychiatric Specialty Exam: General Appearance: Casual and Multiple tattoos in her arms  Eye Contact::  Fair  Speech:  Normal Rate  Volume:  Normal  Mood:  Anxious  Affect:  Labile  Thought Process:  Coherent  Orientation:  Full (Time, Place, and Person)  Thought Content:  WDL  Suicidal Thoughts:  No  Homicidal Thoughts:  No  Memory:  Immediate;   Fair Recent;   Fair Remote;   Fair  Judgement:  Fair  Insight:  Fair  Psychomotor Activity:  Normal  Concentration:  Fair  Recall:  Fair  Fund of Knowledge:  Good  Language:  Fair  Akathisia:  No  Handed:  Right  AIMS (if indicated):     Assets:  Communication Skills Desire for Improvement Financial Resources/Insurance  ADL's:  Intact  Cognition:  WNL  Sleep:        Established Problem, Stable/Improving (1), Review of Psycho-Social Stressors (1), Review of Last Therapy Session (1) and Review of Medication Regimen & Side Effects (2)  Assessment: Axis I: Bipolar disorder depressed type, posttraumatic stress disorder, major depressive disorder mild recurrent,  Axis II: Borderline traits   Axis III:  Past Medical History  Diagnosis Date  . Anemia   . Anxiety   . Depression   . ADHD (attention deficit hyperactivity disorder)   . Allergy   . Shortness of breath   . Pneumonia   .  Heartburn     hx:  . Headache(784.0)     Hx; of migraines  . Asthma     pt states when she was a child  . Concussion 12/30/14     Plan:  Patient doing better on her current psychiatric medication.  She is no longer taking control the medication.  I will continue Effexor 75 mg daily, Vistaril 50 mg as needed and Lamictal 150 mg daily.  Discussed medication side effects and benefits.  Patient does not have any rash or itching.  Recommended to call us back if she has any question or any concern.  Follow-up in 3 months.  Dcuss safety plan that anytime having active suicidal thoughts or homicidal thoughts then patient need to call 911 or go to the local emergency room.   Kyannah Climer T., MD 11/25/2015

## 2016-02-25 ENCOUNTER — Ambulatory Visit (HOSPITAL_COMMUNITY): Payer: Self-pay | Admitting: Psychiatry

## 2016-03-05 ENCOUNTER — Other Ambulatory Visit (HOSPITAL_COMMUNITY): Payer: Self-pay | Admitting: Psychiatry

## 2016-03-05 DIAGNOSIS — F3131 Bipolar disorder, current episode depressed, mild: Secondary | ICD-10-CM

## 2016-03-07 ENCOUNTER — Other Ambulatory Visit (HOSPITAL_COMMUNITY): Payer: Self-pay

## 2016-03-07 DIAGNOSIS — F3131 Bipolar disorder, current episode depressed, mild: Secondary | ICD-10-CM

## 2016-03-07 MED ORDER — LAMOTRIGINE 150 MG PO TABS: ORAL_TABLET | ORAL | 1 refills | Status: DC

## 2016-03-07 MED ORDER — VENLAFAXINE HCL ER 75 MG PO CP24
ORAL_CAPSULE | ORAL | 1 refills | Status: DC
Start: 2016-03-07 — End: 2016-04-19

## 2016-03-07 MED ORDER — HYDROXYZINE PAMOATE 50 MG PO CAPS: ORAL_CAPSULE | ORAL | 1 refills | Status: DC

## 2016-04-19 ENCOUNTER — Encounter (HOSPITAL_COMMUNITY): Payer: Self-pay | Admitting: Psychiatry

## 2016-04-19 ENCOUNTER — Ambulatory Visit (INDEPENDENT_AMBULATORY_CARE_PROVIDER_SITE_OTHER): Payer: BLUE CROSS/BLUE SHIELD | Admitting: Psychiatry

## 2016-04-19 DIAGNOSIS — F3131 Bipolar disorder, current episode depressed, mild: Secondary | ICD-10-CM | POA: Diagnosis not present

## 2016-04-19 MED ORDER — VENLAFAXINE HCL ER 75 MG PO CP24: ORAL_CAPSULE | ORAL | 2 refills | Status: DC

## 2016-04-19 MED ORDER — HYDROXYZINE PAMOATE 50 MG PO CAPS: ORAL_CAPSULE | ORAL | 2 refills | Status: DC

## 2016-04-19 MED ORDER — LAMOTRIGINE 200 MG PO TABS: 200.0000 mg | ORAL_TABLET | Freq: Every day | ORAL | 2 refills | Status: DC

## 2016-04-19 NOTE — Progress Notes (Signed)
Tammy Wilkinson [id number] Progress Note  Tammy Wilkinson [id number] 30 y.o.  04/19/2016 3:54 PM  Chief Complaint:  I am more anxious and depressed in recent weeks.  I was very sick and taken multiple medication for chronic cough.        History of Present Illness:  Tammy Wilkinson came for her follow-up appointment.  She reported some are was very difficult .  She had chronic cough and she has given multiple antibiotics, prednisone and cough medication with little relief.  Now she is taking Vicodin for pain and cough.  She reported due to cough she fractured her back .  She reported not able to work for 2 months because of sickness.  Now she is feeling somewhat better.  She feels sometimes very anxious, nervous and having poor sleep.  She mentioned in June she was very stressed due to anniversary of her sister who committed suicide.  She's been taking her medication and reported no side effects.  She is no longer taking tramadol.  She lives with her husband and almost 9 years old daughter.  Her husband is very supportive.  She denies any paranoia, hallucination or any suicidal thoughts.  Her energy level is low.  Her appetite is fair.  She has no tremors, shakes or any EPS.  She has no rash, itching or any headaches with the medication.  She is taking Lamictal but wondering if the dose can further increase.  Suicidal Ideation: No Plan Formed: No Patient has means to carry out plan: No  Homicidal Ideation: No Plan Formed: No Patient has means to carry out plan: No  Past Psychiatric History/Hospitalization(s): Patient seeing psychiatrist since age 42 when her parents got divorced.  She has a history of cutting herself. She has seen Dr. Ladona Ridgel in this office and then she has seen psychiatrist at [facility].  She had diagnosed with ADHD, bipolar disorder, borderline personality disorder and severe depression.  She had tried in the past Effexor, prazosin, Cymbalta , Seroquel, trazodone, Zoloft, Lexapro,  Klonopin, Abilify, Valium, Adderall and Xanax.  She endorse history of poor impulse control, sexual inappropriate behavior , agitation and anger issues.  She remember Effexor helped the most.  Anxiety: Yes Bipolar Disorder: Yes Depression: Yes Mania: Yes Psychosis: No Schizophrenia: No Personality Disorder: Patient diagnosed borderline personality disorder Hospitalization for psychiatric illness: No History of Electroconvulsive Shock Therapy: No Prior Suicide Attempts: No   Family History; Patient endorse mother has depression, sister has bipolar depression who committed suicide. Another sister had addiction with pain medication who killed herself by taking overdose on pain medication.  Medical History;  patient see Dr. Althea Charon who is orthopedic surgeon at [facility]oped[facility].  She is getting pain medication from them.  She has chronic back pain .  Review of Systems  Constitutional: Negative.  Negative for weight loss.  Eyes: Negative.   Cardiovascular: Negative for chest pain and palpitations.  Gastrointestinal: Negative for nausea and vomiting.  Musculoskeletal: Positive for back pain and joint pain.  Skin: Negative for itching and rash.       Tattoos on both arms  Neurological: Negative for dizziness and tremors.  Psychiatric/Behavioral: Negative for memory loss.    Psychiatric: Agitation: No Hallucination: No Depressed Mood: Yes Insomnia: Yes Hypersomnia: No Altered Concentration: No Feels Worthless: No Grandiose Ideas: No Belief In Special Powers: No New/Increased Substance Abuse: No Compulsions: No  Neurologic: Headache: Yes Seizure: No Paresthesias: No   Outpatient Encounter Prescriptions as of 04/19/2016  Medication Sig  . cetirizine (  ZYRTEC) 10 MG tablet Take 10 mg by mouth daily.  Marland Kitchen HYDROcodone-acetaminophen (NORCO/VICODIN) 5-325 MG tablet   . hydrOXYzine (VISTARIL) 50 MG capsule TAKE ONE TO two CAPSULE BY MOUTH TWICE DAILY BETWEEN MEALS AS NEEDED FOR  ANXIETY  . indomethacin (INDOCIN) 50 MG capsule   . lamoTRIgine (LAMICTAL) 200 MG tablet Take 1 tablet (200 mg total) by mouth daily.  Marland Kitchen PROAIR HFA 108 (90 Base) MCG/ACT inhaler INHALE 2 PUFFS PO QID PRN  . promethazine-dextromethorphan (PROMETHAZINE-DM) 6.25-15 MG/5ML syrup TK 5 ML PO Q 6 H PRN COU AND N  . venlafaxine XR (EFFEXOR-XR) 75 MG 24 hr capsule TAKE 1 CAPSULE(75 MG) BY MOUTH DAILY WITH BREAKFAST  . [DISCONTINUED] diazepam (VALIUM) 5 MG tablet TK 1/2 TO 1 T PO 30 MINUTES PRIOR TO SCAN  . [DISCONTINUED] hydrOXYzine (VISTARIL) 50 MG capsule TAKE ONE CAPSULE BY MOUTH TWICE DAILY BETWEEN MEALS AS NEEDED FOR ANXIETY  . [DISCONTINUED] lamoTRIgine (LAMICTAL) 150 MG tablet TAKE 1 TABLET(150 MG) BY MOUTH DAILY  . [DISCONTINUED] traMADol (ULTRAM) 50 MG tablet   . [DISCONTINUED] venlafaxine XR (EFFEXOR-XR) 75 MG 24 hr capsule TAKE 1 CAPSULE(75 MG) BY MOUTH DAILY WITH BREAKFAST   No facility-administered encounter medications on file as of 04/19/2016.     No results found for this or any previous visit (from the past 2160 hour(s)).    Constitutional:  BP 122/68   Pulse (!) 103   Ht 5\' 4"  (1.626 m)   Wt 171 lb 6.4 oz (77.7 kg)   BMI 29.42 kg/m    Musculoskeletal: Strength & Muscle Tone: within normal limits Gait & Station: normal Patient leans: N/A  Psychiatric Specialty Exam: General Appearance: Casual and Multiple tattoos in her arms  Eye Contact::  Fair  Speech:  Normal Rate  Volume:  Normal  Mood:  Anxious  Affect:  Labile  Thought Process:  Coherent  Orientation:  Full (Time, Place, and Person)  Thought Content:  WDL  Suicidal Thoughts:  No  Homicidal Thoughts:  No  Memory:  Immediate;   Fair Recent;   Fair Remote;   Fair  Judgement:  Fair  Insight:  Fair  Psychomotor Activity:  Normal  Concentration:  Fair  Recall:  Fair  Fund of Knowledge:  Good  Language:  Fair  Akathisia:  No  Handed:  Right  AIMS (if indicated):     Assets:  Communication Skills Desire  for Improvement Financial Resources/Insurance  ADL's:  Intact  Cognition:  WNL  Sleep:        Established Problem, Stable/Improving (1), Review of Psycho-Social Stressors (1), Established Problem, Worsening (2), Review of Last Therapy Session (1), Review of Medication Regimen & Side Effects (2) and Review of New Medication or Change in Dosage (2)  Assessment: Axis I: Bipolar disorder depressed type, posttraumatic stress disorder, major depressive disorder mild recurrent,  Axis II: Borderline traits   Axis III:  Past Medical History:  Diagnosis Date  . ADHD (attention deficit hyperactivity disorder)   . Allergy   . Anemia   . Anxiety   . Asthma    pt states when she was a child  . Concussion 12/30/14  . Depression   . Headache(784.0)    Hx; of migraines  . Heartburn    hx:  . Pneumonia   . Shortness of breath      Plan:  I reviewed her records, current medication and collateral information.  She is no longer taking tramadol.  She is taking Vicodin for pain and cough.  She is also taking inhaler and feeling better from the past.  I recommended to increase Lamictal 200 mg daily and she can continue Vistaril 1-5 mg 1-2 capsule as needed for insomnia.  Discussed medication side effects and benefits.  Recommended to call us back if she has any question, concern for worsening of the symptom. Discuss safety plan that anytime having active suicidal thoughts or homicidal thoughts then patient need to call 911 or go to the local emergency room.  Follow-up in 3 months   Kaheem Halleck T., MD 04/19/2016         Patient ID: Frann Riderristina Sano, female   DOB: 04-26-1986, 30 y.o.   MRN: [id number]

## 2016-06-26 ENCOUNTER — Other Ambulatory Visit (HOSPITAL_COMMUNITY): Payer: Self-pay | Admitting: Psychiatry

## 2016-06-26 DIAGNOSIS — F3131 Bipolar disorder, current episode depressed, mild: Secondary | ICD-10-CM

## 2016-06-28 ENCOUNTER — Other Ambulatory Visit (HOSPITAL_COMMUNITY): Payer: Self-pay | Admitting: Psychiatry

## 2016-06-28 DIAGNOSIS — F3131 Bipolar disorder, current episode depressed, mild: Secondary | ICD-10-CM

## 2016-06-28 MED ORDER — VENLAFAXINE HCL ER 75 MG PO CP24: ORAL_CAPSULE | ORAL | 0 refills | Status: DC

## 2016-06-28 MED ORDER — LAMOTRIGINE 200 MG PO TABS: 200.0000 mg | ORAL_TABLET | Freq: Every day | ORAL | 0 refills | Status: DC

## 2016-06-28 NOTE — Telephone Encounter (Signed)
Dose increase to 200 mg.

## 2016-07-25 ENCOUNTER — Ambulatory Visit (HOSPITAL_COMMUNITY): Payer: Self-pay | Admitting: Psychiatry

## 2016-08-02 ENCOUNTER — Ambulatory Visit (INDEPENDENT_AMBULATORY_CARE_PROVIDER_SITE_OTHER): Payer: BLUE CROSS/BLUE SHIELD | Admitting: Psychiatry

## 2016-08-02 ENCOUNTER — Encounter (HOSPITAL_COMMUNITY): Payer: Self-pay | Admitting: Psychiatry

## 2016-08-02 VITALS — BP 118/68 | HR 101 | Ht 64.0 in | Wt 172.8 lb

## 2016-08-02 DIAGNOSIS — F17210 Nicotine dependence, cigarettes, uncomplicated: Secondary | ICD-10-CM

## 2016-08-02 DIAGNOSIS — F3131 Bipolar disorder, current episode depressed, mild: Secondary | ICD-10-CM

## 2016-08-02 DIAGNOSIS — Z818 Family history of other mental and behavioral disorders: Secondary | ICD-10-CM

## 2016-08-02 DIAGNOSIS — Z811 Family history of alcohol abuse and dependence: Secondary | ICD-10-CM

## 2016-08-02 DIAGNOSIS — Z880 Allergy status to penicillin: Secondary | ICD-10-CM

## 2016-08-02 DIAGNOSIS — Z79899 Other long term (current) drug therapy: Secondary | ICD-10-CM

## 2016-08-02 DIAGNOSIS — F411 Generalized anxiety disorder: Secondary | ICD-10-CM | POA: Diagnosis not present

## 2016-08-02 DIAGNOSIS — Z8489 Family history of other specified conditions: Secondary | ICD-10-CM

## 2016-08-02 DIAGNOSIS — Z9889 Other specified postprocedural states: Secondary | ICD-10-CM

## 2016-08-02 DIAGNOSIS — Z91040 Latex allergy status: Secondary | ICD-10-CM

## 2016-08-02 DIAGNOSIS — Z888 Allergy status to other drugs, medicaments and biological substances status: Secondary | ICD-10-CM

## 2016-08-02 DIAGNOSIS — Z813 Family history of other psychoactive substance abuse and dependence: Secondary | ICD-10-CM

## 2016-08-02 DIAGNOSIS — Z8249 Family history of ischemic heart disease and other diseases of the circulatory system: Secondary | ICD-10-CM | POA: Diagnosis not present

## 2016-08-02 MED ORDER — CLONAZEPAM 0.5 MG PO TABS: 0.5000 mg | ORAL_TABLET | Freq: Every day | ORAL | 0 refills | Status: DC | PRN

## 2016-08-02 MED ORDER — VENLAFAXINE HCL ER 75 MG PO CP24: ORAL_CAPSULE | ORAL | 2 refills | Status: DC

## 2016-08-02 MED ORDER — TRAZODONE HCL 50 MG PO TABS: 50.0000 mg | ORAL_TABLET | Freq: Every evening | ORAL | 1 refills | Status: DC | PRN

## 2016-08-02 MED ORDER — LAMOTRIGINE 200 MG PO TABS: 200.0000 mg | ORAL_TABLET | Freq: Every day | ORAL | 2 refills | Status: DC

## 2016-08-02 NOTE — Progress Notes (Signed)
BH MD/PA/NP OP Progress Note  08/02/2016 3:33 PM Tammy Wilkinson  MRN:  [id number]  Chief Complaint:  Subjective:  I don't think my medicine working.  I am more anxious and nervous.  My husband has shoulder surgery in December and he is out of work.  HPI: Tammy Wilkinson came for her follow-up appointment.  She is complaining of increased anxiety and nervousness.  She reported her husband had a shoulder surgery in December and since then he is out of work and she is taking care of him.  She admitted getting easily overwhelmed and anxious.  She is not sleeping as good.  She endorse since husband is not going to work there has been some financial strain.  She does not believe Vistaril helping as much.  She gets nervous and anxious.  Though she denies any paranoia, hallucination or any suicidal thoughts but admitted irritability, social isolation, withdrawn with lack of motivation.  She lives with her husband and 30-year-old daughter.  On last visit we increase Lamictal which helps her mood swing and irritability.  On Christmas she was thinking about her sister who committed suicide.  She was tearful and sad.  Patient is not able to resume her work because she was very sick in the summer and she missed working.  Now she is feeling better.  She is no longer taking any antibiotic.  She is compliant with Effexor and Lamictal.  She has no rash, itching or any shakes.  Her energy level is fair.  Her appetite is okay.  Patient denies drinking alcohol or using any illegal substances.  Visit Diagnosis:    ICD-9-CM ICD-10-CM   1. Bipolar affective disorder, currently depressed, mild (HCC) 296.51 F31.31 venlafaxine XR (EFFEXOR-XR) 75 MG 24 hr capsule     lamoTRIgine (LAMICTAL) 200 MG tablet     traZODone (DESYREL) 50 MG tablet     clonazePAM (KLONOPIN) 0.5 MG tablet  2. Generalized anxiety disorder 300.02 F41.1 clonazePAM (KLONOPIN) 0.5 MG tablet    Past Psychiatric History: Patient endorse seeing psychiatrist since  age 31 when her parents got divorced.  She has history of cutting herself.  She has seen in the past Dr. Ladona Ridgelaylor and psychiatrist at [facility].  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  In the past she has taken Effexor, Prozac was seen, Cymbalta, Seroquel, trazodone, Zoloft, Lexapro, Klonopin, Abilify, Valium, Adderall, Xanax and Klonopin.  Patient has history of mood swing, impulsive behavior, sexually inappropriate behavior, agitation and anger issues.  Patient denies any history of psychiatric inpatient treatment or any suicidal attempt.  Past Medical History:  Past Medical History:  Diagnosis Date  . ADHD (attention deficit hyperactivity disorder)   . Allergy   . Anemia   . Anxiety   . Asthma    pt states when she was a child  . Concussion 12/30/14  . Depression   . Headache(784.0)    Hx; of migraines  . Heartburn    hx:  . Pneumonia   . Shortness of breath     Past Surgical History:  Procedure Laterality Date  . LARYNGOPLASTY Left 03/29/2013   Procedure: VOCAL CORD MEDIALIZATION;  Surgeon: Serena ColonelJefry Rosen, MD;  Location: [facility] OR;  Service: ENT;  Laterality: Left;  with placement of silastic block  . WISDOM TOOTH EXTRACTION      Family Psychiatric History: Reviewed.  Family History:  Family History  Problem Relation Age of Onset  . Hypertension Mother   . Anxiety disorder  Mother   . Depression Mother   . Physical abuse Mother   . Hypertension Father   . Drug abuse Sister   . Anxiety disorder Sister   . Depression Sister   . Bipolar disorder Sister   . Sexual abuse Sister   . Alcohol abuse Brother   . Drug abuse Brother   . Depression Brother   . Anxiety disorder Brother   . ADD / ADHD Other   . ADD / ADHD Other   . Schizophrenia Sister   . Sexual abuse Sister   . ADD / ADHD Sister   . Depression Sister   . Sexual abuse Sister   . Drug abuse Sister   . Anxiety disorder Sister   . Depression Sister    . Drug abuse Sister   . Anesthesia problems Neg Hx     Social History:  Social History   Social History  . Marital status: Married    Spouse name: N/A  . Number of children: N/A  . Years of education: N/A   Social History Main Topics  . Smoking status: Current Every Day Smoker    Packs/day: 0.50    Types: Cigarettes    Last attempt to quit: 08/25/2013  . Smokeless tobacco: Never Used  . Alcohol use 1.2 oz/week    2 Cans of beer per week     Comment: rare  . Drug use: No  . Sexual activity: Yes    Birth control/ protection: IUD   Other Topics Concern  . None   Social History Narrative  . None    Allergies:  Allergies  Allergen Reactions  . Latex Hives  . Amoxil [Amoxicillin] Other (See Comments)    Childhood reaction hallucinations  . Penicillins Other (See Comments)    Childhood reaction hallucinations    Metabolic Disorder Labs: Lab Results  Component Value Date   HGBA1C 5.1 03/26/2015   MPG 100 03/26/2015   No results found for: PROLACTIN No results found for: CHOL, TRIG, HDL, CHOLHDL, VLDL, LDLCALC   Current Medications: Current Outpatient Prescriptions  Medication Sig Dispense Refill  . cetirizine (ZYRTEC) 10 MG tablet Take 10 mg by mouth daily.    Marland Kitchen lamoTRIgine (LAMICTAL) 200 MG tablet Take 1 tablet (200 mg total) by mouth daily. 30 tablet 2  . venlafaxine XR (EFFEXOR-XR) 75 MG 24 hr capsule TAKE 1 CAPSULE(75 MG) BY MOUTH DAILY WITH BREAKFAST 30 capsule 2  . clonazePAM (KLONOPIN) 0.5 MG tablet Take 1 tablet (0.5 mg total) by mouth daily as needed for anxiety. 30 tablet 0  . PROAIR HFA 108 (90 Base) MCG/ACT inhaler INHALE 2 PUFFS PO QID PRN  0  . traZODone (DESYREL) 50 MG tablet Take 1 tablet (50 mg total) by mouth at bedtime as needed for sleep. 30 tablet 1   No current facility-administered medications for this visit.     Neurologic: Headache: No Seizure: No Paresthesias: No  Musculoskeletal: Strength & Muscle Tone: within normal  limits Gait & Station: normal Patient leans: N/A  Psychiatric Specialty Exam: Review of Systems  Constitutional: Negative.   HENT: Negative.   Eyes: Negative.   Respiratory: Negative.   Cardiovascular: Negative.   Genitourinary: Negative.   Musculoskeletal: Negative.   Skin: Negative.  Negative for itching and rash.  Neurological: Negative for tingling and tremors.  Psychiatric/Behavioral: Positive for depression. The patient is nervous/anxious and has insomnia.     Blood pressure 118/68, pulse (!) 101, height 5\' 4"  (1.626 m), weight 172 lb 12.8 oz (  78.4 kg).Body mass index is 29.66 kg/m.  General Appearance: Casual and Multiple tattoos in her on  Eye Contact:  Fair  Speech:  Clear and Coherent  Volume:  Normal  Mood:  Anxious and Depressed  Affect:  Constricted and Depressed  Thought Process:  Goal Directed  Orientation:  Full (Time, Place, and Person)  Thought Content: Rumination   Suicidal Thoughts:  No  Homicidal Thoughts:  No  Memory:  Immediate;   Good Recent;   Good Remote;   Good  Judgement:  Good  Insight:  Good  Psychomotor Activity:  Normal  Concentration:  Concentration: Fair and Attention Span: Fair  Recall:  Good  Fund of Knowledge: Good  Language: Good  Akathisia:  No  Handed:  Right  AIMS (if indicated):  0  Assets:  Communication Skills Desire for Improvement Housing Social Support  ADL's:  Intact  Cognition: WNL  Sleep:  fair   Assessment: Tammy Wilkinson is 31 year old Caucasian female with diagnoses of bipolar disorder depressed type, posttraumatic stress disorder and major depressive disorder.  Patient has symptoms of anxiety and depression.  Plan: I review her symptoms, history, past treatment response, psychosocial stressors and current medication.  In the past she had a good response with low-dose Klonopin.  I will discontinue Vistaril and try Klonopin 0.5 mg as needed for anxiety.  I will also start low-dose trazodone to help insomnia.  Continue  Lamictal 200 mg daily and Effexor XR 75 mg daily.  Discussed medication side effects and benefits.  I offer counseling but due to financial reasons patient unable to afford the counseling at this time. Discussed medication side effects and benefits.  Discussed benzodiazepine dependence, tolerance and withdrawal.  Recommended to call us back if there is any question, concern or worsening of the symptoms.  Discuss safety plan that anytime having active suicidal thoughts or homicidal thoughts and she need to call 911 or go to the local emergency room.     Marquis Down T., MD 08/02/2016, 3:33 PM

## 2016-08-31 ENCOUNTER — Other Ambulatory Visit (HOSPITAL_COMMUNITY): Payer: Self-pay

## 2016-08-31 DIAGNOSIS — F411 Generalized anxiety disorder: Secondary | ICD-10-CM

## 2016-08-31 DIAGNOSIS — F3131 Bipolar disorder, current episode depressed, mild: Secondary | ICD-10-CM

## 2016-08-31 MED ORDER — CLONAZEPAM 0.5 MG PO TABS: 0.5000 mg | ORAL_TABLET | Freq: Every day | ORAL | 0 refills | Status: DC | PRN

## 2016-09-06 ENCOUNTER — Ambulatory Visit (INDEPENDENT_AMBULATORY_CARE_PROVIDER_SITE_OTHER): Payer: BLUE CROSS/BLUE SHIELD | Admitting: Psychiatry

## 2016-09-06 ENCOUNTER — Encounter (HOSPITAL_COMMUNITY): Payer: Self-pay | Admitting: Psychiatry

## 2016-09-06 DIAGNOSIS — Z8249 Family history of ischemic heart disease and other diseases of the circulatory system: Secondary | ICD-10-CM

## 2016-09-06 DIAGNOSIS — F17210 Nicotine dependence, cigarettes, uncomplicated: Secondary | ICD-10-CM

## 2016-09-06 DIAGNOSIS — Z9889 Other specified postprocedural states: Secondary | ICD-10-CM | POA: Diagnosis not present

## 2016-09-06 DIAGNOSIS — Z79899 Other long term (current) drug therapy: Secondary | ICD-10-CM

## 2016-09-06 DIAGNOSIS — Z813 Family history of other psychoactive substance abuse and dependence: Secondary | ICD-10-CM

## 2016-09-06 DIAGNOSIS — Z880 Allergy status to penicillin: Secondary | ICD-10-CM

## 2016-09-06 DIAGNOSIS — J45909 Unspecified asthma, uncomplicated: Secondary | ICD-10-CM | POA: Diagnosis not present

## 2016-09-06 DIAGNOSIS — F3131 Bipolar disorder, current episode depressed, mild: Secondary | ICD-10-CM | POA: Diagnosis not present

## 2016-09-06 DIAGNOSIS — Z818 Family history of other mental and behavioral disorders: Secondary | ICD-10-CM

## 2016-09-06 DIAGNOSIS — Z91040 Latex allergy status: Secondary | ICD-10-CM

## 2016-09-06 DIAGNOSIS — F411 Generalized anxiety disorder: Secondary | ICD-10-CM | POA: Diagnosis not present

## 2016-09-06 DIAGNOSIS — Z888 Allergy status to other drugs, medicaments and biological substances status: Secondary | ICD-10-CM

## 2016-09-06 DIAGNOSIS — Z811 Family history of alcohol abuse and dependence: Secondary | ICD-10-CM

## 2016-09-06 MED ORDER — CLONAZEPAM 0.5 MG PO TABS: 0.5000 mg | ORAL_TABLET | Freq: Every day | ORAL | 1 refills | Status: DC | PRN

## 2016-09-06 MED ORDER — TRAZODONE HCL 50 MG PO TABS: 50.0000 mg | ORAL_TABLET | Freq: Every evening | ORAL | 1 refills | Status: DC | PRN

## 2016-09-06 MED ORDER — VENLAFAXINE HCL ER 150 MG PO CP24: ORAL_CAPSULE | ORAL | 1 refills | Status: DC

## 2016-09-06 NOTE — Progress Notes (Addendum)
BH MD/PA/NP OP Progress Note  09/06/2016 10:25 AM Tammy Wilkinson  MRN:  [id number]  Chief Complaint:  Subjective:  I'm sleeping better but I still have depression.  I am having arguments with my husband.  Last night I cut my arm superficially.  HPI: Tammy Wilkinson came for her follow-up appointment.  She endorse that adding Klonopin and trazodone helping her sleep but she still feels very depressed and stressed out.  She admitted having arguments with the husband and last night she was very upset and cut superficially her left arm because she wants to feel the pain.  She denies that it was suicidal attempt.  There were no bleeding but she has superficial scratches.  She admitted that she gets sometimes overwhelmed and her husband does not understand.  She admitted lately more irritable, angry and easily frustrated.  She's not seeing therapist but like to see a marriage counselor.  She denies any paranoia or any hallucination.  She denies any suicidal thoughts or homicidal thought.  On her last visit we added Klonopin and low-dose trazodone which is helping her sleep.  Patient mentioned her sister takes Wellbutrin.  Patient denies drinking alcohol or using any illegal substances.  She denies any mania or any psychosis.  Patient lives with her husband and 3 children.  Her appetite is fair.  Her vital signs are stable.  Visit Diagnosis:    ICD-9-CM ICD-10-CM   1. Bipolar affective disorder, currently depressed, mild (HCC) 296.51 F31.31 venlafaxine XR (EFFEXOR-XR) 150 MG 24 hr capsule     traZODone (DESYREL) 50 MG tablet     clonazePAM (KLONOPIN) 0.5 MG tablet  2. Generalized anxiety disorder 300.02 F41.1 clonazePAM (KLONOPIN) 0.5 MG tablet    Past Psychiatric History: Reviewed. Patient endorse seeing psychiatrist since age 28 when her parents got divorced.  She has history of cutting herself.  She has seen in the past Dr. Ladona Ridgel and psychiatrist at [facility].  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  In the past she has taken Effexor, Prozac was seen, Cymbalta, Seroquel, trazodone, Zoloft, Lexapro, Klonopin, Abilify, Valium, Adderall, Xanax and Klonopin.  Patient has history of mood swing, impulsive behavior, sexually inappropriate behavior, agitation and anger issues.  Patient denies any history of psychiatric inpatient treatment or any suicidal attempt.  Past Medical History:  Past Medical History:  Diagnosis Date  . ADHD (attention deficit hyperactivity disorder)   . Allergy   . Anemia   . Anxiety   . Asthma    pt states when she was a child  . Concussion 12/30/14  . Depression   . Headache(784.0)    Hx; of migraines  . Heartburn    hx:  . Pneumonia   . Shortness of breath     Past Surgical History:  Procedure Laterality Date  . LARYNGOPLASTY Left 03/29/2013   Procedure: VOCAL CORD MEDIALIZATION;  Surgeon: Serena Colonel, MD;  Location: [facility] OR;  Service: ENT;  Laterality: Left;  with placement of silastic block  . WISDOM TOOTH EXTRACTION      Family Psychiatric History: Reviewed.  Family History:  Family History  Problem Relation Age of Onset  . Hypertension Mother   . Anxiety disorder Mother   . Depression Mother   . Physical abuse Mother   . Hypertension Father   . Drug abuse Sister   . Anxiety disorder Sister   . Depression Sister   . Bipolar disorder Sister   . Sexual abuse Sister   .  Alcohol abuse Brother   . Drug abuse Brother   . Depression Brother   . Anxiety disorder Brother   . ADD / ADHD Other   . ADD / ADHD Other   . Schizophrenia Sister   . Sexual abuse Sister   . ADD / ADHD Sister   . Depression Sister   . Sexual abuse Sister   . Drug abuse Sister   . Anxiety disorder Sister   . Depression Sister   . Drug abuse Sister   . Anesthesia problems Neg Hx     Social History:  Social History   Social History  . Marital status: Married    Spouse name: N/A  . Number of  children: N/A  . Years of education: N/A   Social History Main Topics  . Smoking status: Current Every Day Smoker    Packs/day: 0.50    Types: Cigarettes    Last attempt to quit: 08/25/2013  . Smokeless tobacco: Never Used  . Alcohol use 1.2 oz/week    2 Cans of beer per week     Comment: rare  . Drug use: No  . Sexual activity: Yes    Birth control/ protection: IUD   Other Topics Concern  . None   Social History Narrative  . None    Allergies:  Allergies  Allergen Reactions  . Latex Hives  . Amoxil [Amoxicillin] Other (See Comments)    Childhood reaction hallucinations  . Penicillins Other (See Comments)    Childhood reaction hallucinations    Metabolic Disorder Labs: Lab Results  Component Value Date   HGBA1C 5.1 03/26/2015   MPG 100 03/26/2015   No results found for: PROLACTIN No results found for: CHOL, TRIG, HDL, CHOLHDL, VLDL, LDLCALC   Current Medications: Current Outpatient Prescriptions  Medication Sig Dispense Refill  . cetirizine (ZYRTEC) 10 MG tablet Take 10 mg by mouth daily.    . clonazePAM (KLONOPIN) 0.5 MG tablet Take 1 tablet (0.5 mg total) by mouth daily as needed for anxiety. 30 tablet 0  . lamoTRIgine (LAMICTAL) 200 MG tablet Take 1 tablet (200 mg total) by mouth daily. 30 tablet 2  . PROAIR HFA 108 (90 Base) MCG/ACT inhaler INHALE 2 PUFFS PO QID PRN  0  . traZODone (DESYREL) 50 MG tablet Take 1 tablet (50 mg total) by mouth at bedtime as needed for sleep. 30 tablet 1  . venlafaxine XR (EFFEXOR-XR) 75 MG 24 hr capsule TAKE 1 CAPSULE(75 MG) BY MOUTH DAILY WITH BREAKFAST 30 capsule 2   No current facility-administered medications for this visit.     Neurologic: Headache: No Seizure: No Paresthesias: No  Musculoskeletal: Strength & Muscle Tone: within normal limits Gait & Station: normal Patient leans: N/A  Psychiatric Specialty Exam: Review of Systems  Constitutional: Negative.   HENT: Negative.   Respiratory: Negative.    Cardiovascular: Negative.   Musculoskeletal: Negative.   Skin: Negative for itching and rash.       Superficial scratches on her left arm.  No bleeding.  No rash.  Multiple tattoos on her both arm.  Neurological: Negative.   Psychiatric/Behavioral: Positive for depression. The patient is nervous/anxious.     Blood pressure 122/70, pulse (!) 112, height 5\' 4"  (1.626 m), weight 176 lb 3.2 oz (79.9 kg).Body mass index is 30.24 kg/m.  General Appearance: Fairly Groomed  Eye Contact:  Good  Speech:  Slow  Volume:  Normal  Mood:  Depressed and Dysphoric  Affect:  Constricted and Depressed  Thought Process:  Goal Directed  Orientation:  Full (Time, Place, and Person)  Thought Content: Rumination   Suicidal Thoughts:  No  Homicidal Thoughts:  No  Memory:  Immediate;   Fair Recent;   Fair Remote;   Fair  Judgement:  Good  Insight:  Good  Psychomotor Activity:  Decreased  Concentration:  Concentration: Fair and Attention Span: Fair  Recall:  Good  Fund of Knowledge: Good  Language: Good  Akathisia:  No  Handed:  Right  AIMS (if indicated):  0  Assets:  Communication Skills Desire for Improvement Housing Resilience Social Support  ADL's:  Intact  Cognition: WNL  Sleep:  good   Assessment: Bipolar disorder currently mildly depressed.  Generalized anxiety disorder  Plan: I review her symptoms, history and current medication.  Recommended to try increase Effexor 150 mg to help the depression and irritability.  Continue Lamictal 200 mg daily, Klonopin 0.5 mg as needed and trazodone 50 mg at bedtime.  I discuss in length about her cutting behavior and she admitted that it was just to get attention and feel the pain but there were no suicidal intent.  I encourage to see a marriage counselor And she was given name Lina Sar at [facility] for marriage counseling.  If symptoms do not improve with the increase Effexor we will consider either switching to Wellbutrin or  increasing Lamictal.  Follow-up in 2 months.  I do believe patient need individual therapy but patient refused since she had tried therapy in the past with limited response. Discussed medication side effects and benefits.  Recommended to call us back if there is any question, concern or worsening of the symptoms.  Discuss safety plan that anytime having active suicidal thoughts or homicidal thoughts and she need to call 911 or go to the local emergency room.   Beth Spackman T., MD 09/06/2016, 10:25 AM

## 2016-09-08 ENCOUNTER — Encounter (HOSPITAL_COMMUNITY): Payer: Self-pay

## 2016-09-08 ENCOUNTER — Telehealth (HOSPITAL_COMMUNITY): Payer: Self-pay

## 2016-09-08 NOTE — Telephone Encounter (Signed)
Patient left me a voicemail stating she got  A call that you wanted to speak with her. Were you trying to contact patient? If so, here is her call back number: [phone]

## 2016-09-08 NOTE — Telephone Encounter (Signed)
No, I did not try to contact her.

## 2016-09-08 NOTE — Telephone Encounter (Signed)
Called patient back and let her know, she said she may have misunderstood the voicemail and she would listen to it again

## 2016-10-06 ENCOUNTER — Other Ambulatory Visit: Payer: Self-pay | Admitting: Sports Medicine

## 2016-10-06 DIAGNOSIS — M545 Low back pain: Secondary | ICD-10-CM

## 2016-10-18 ENCOUNTER — Ambulatory Visit
Admission: RE | Admit: 2016-10-18 | Discharge: 2016-10-18 | Disposition: A | Discharge status: Home / Self Care | Payer: BLUE CROSS/BLUE SHIELD | Source: Ambulatory Visit | Attending: Sports Medicine | Admitting: Sports Medicine

## 2016-10-18 DIAGNOSIS — M545 Low back pain: Secondary | ICD-10-CM

## 2016-10-26 ENCOUNTER — Other Ambulatory Visit (HOSPITAL_COMMUNITY): Payer: Self-pay | Admitting: Psychiatry

## 2016-10-26 DIAGNOSIS — F3131 Bipolar disorder, current episode depressed, mild: Secondary | ICD-10-CM

## 2016-10-27 ENCOUNTER — Encounter (HOSPITAL_COMMUNITY): Payer: Self-pay | Admitting: Psychiatry

## 2016-10-27 ENCOUNTER — Ambulatory Visit (INDEPENDENT_AMBULATORY_CARE_PROVIDER_SITE_OTHER): Payer: BLUE CROSS/BLUE SHIELD | Admitting: Psychiatry

## 2016-10-27 DIAGNOSIS — Z818 Family history of other mental and behavioral disorders: Secondary | ICD-10-CM

## 2016-10-27 DIAGNOSIS — F411 Generalized anxiety disorder: Secondary | ICD-10-CM

## 2016-10-27 DIAGNOSIS — F17210 Nicotine dependence, cigarettes, uncomplicated: Secondary | ICD-10-CM

## 2016-10-27 DIAGNOSIS — F3131 Bipolar disorder, current episode depressed, mild: Secondary | ICD-10-CM | POA: Diagnosis not present

## 2016-10-27 DIAGNOSIS — Z813 Family history of other psychoactive substance abuse and dependence: Secondary | ICD-10-CM

## 2016-10-27 DIAGNOSIS — Z811 Family history of alcohol abuse and dependence: Secondary | ICD-10-CM | POA: Diagnosis not present

## 2016-10-27 DIAGNOSIS — Z79899 Other long term (current) drug therapy: Secondary | ICD-10-CM

## 2016-10-27 MED ORDER — LAMOTRIGINE 200 MG PO TABS: 200.0000 mg | ORAL_TABLET | Freq: Every day | ORAL | 2 refills | Status: DC

## 2016-10-27 MED ORDER — TRAZODONE HCL 50 MG PO TABS: 50.0000 mg | ORAL_TABLET | Freq: Every evening | ORAL | 2 refills | Status: DC | PRN

## 2016-10-27 MED ORDER — CLONAZEPAM 0.5 MG PO TABS: 0.5000 mg | ORAL_TABLET | Freq: Every day | ORAL | 2 refills | Status: DC | PRN

## 2016-10-27 MED ORDER — VENLAFAXINE HCL ER 150 MG PO CP24: ORAL_CAPSULE | ORAL | 2 refills | Status: DC

## 2016-10-27 NOTE — Progress Notes (Signed)
BH MD/PA/NP OP Progress Note  10/27/2016 4:08 PM Tammy Wilkinson  MRN:  [id number]  Chief Complaint:  Subjective:  I am doing better with increase Effexor.  HPI: Patient came for her follow-up appointment.  She is doing better since the dose of Effexor is increased.  She is tolerating the dose and reported no side effects.  She has not started met at counseling but reported improve relationship with her husband and there has been no recent agitation or arguments with him.  However she still feels that medicines counseling will help the relationship.  She is taking Klonopin 0.5 mg as needed and trazodone 50 mg at bedtime which is helping her sleep.  She denies any mood swing irritability or any paranoia.  She has been not engage in any self abusive behavior since the last visit.  She has no tremors, shakes or any EPS.  Her energy level is good.  She denies any suicidal thoughts or any feeling of hopelessness or worthlessness.  She does not want to change her medication.  Last week she had a birthday and she really enjoyed her birthday with the family.  Patient lives with her husband and 3 children.  Visit Diagnosis:    ICD-9-CM ICD-10-CM   1. Bipolar affective disorder, currently depressed, mild (HCC) 296.51 F31.31 venlafaxine XR (EFFEXOR-XR) 150 MG 24 hr capsule     traZODone (DESYREL) 50 MG tablet     lamoTRIgine (LAMICTAL) 200 MG tablet     clonazePAM (KLONOPIN) 0.5 MG tablet  2. Generalized anxiety disorder 300.02 F41.1 clonazePAM (KLONOPIN) 0.5 MG tablet    Past Psychiatric History: Reviewed. Patient endorse seeing psychiatrist since age 57 when her parents got divorced. She has history of cutting herself. She has seen in the past Dr. Lovena Le and psychiatrist at [facility]. 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 In the past she has taken Effexor, Prozac was seen, Cymbalta, Seroquel, trazodone, Zoloft, Lexapro, Klonopin,  Abilify, Valium, Adderall, Xanax and Klonopin. Patient has history of mood swing, impulsive behavior, sexually inappropriate behavior, agitation and anger issues. Patient denies any history of psychiatric inpatient treatment or any suicidal attempt.  Past Medical History:  Past Medical History:  Diagnosis Date  . ADHD (attention deficit hyperactivity disorder)   . Allergy   . Anemia   . Anxiety   . Asthma    pt states when she was a child  . Concussion 12/30/14  . Depression   . Headache(784.0)    Hx; of migraines  . Heartburn    hx:  . Pneumonia   . Shortness of breath     Past Surgical History:  Procedure Laterality Date  . LARYNGOPLASTY Left 03/29/2013   Procedure: VOCAL CORD MEDIALIZATION;  Surgeon: Izora Gala, MD;  Location: [facility];  Service: ENT;  Laterality: Left;  with placement of silastic block  . WISDOM TOOTH EXTRACTION      Family Psychiatric History: Reviewed.  Family History:  Family History  Problem Relation Age of Onset  . Hypertension Mother   . Anxiety disorder Mother   . Depression Mother   . Physical abuse Mother   . Hypertension Father   . Drug abuse Sister   . Anxiety disorder Sister   . Depression Sister   . Bipolar disorder Sister   . Sexual abuse Sister   . Alcohol abuse Brother   . Drug abuse Brother   . Depression Brother   . Anxiety disorder Brother   . ADD /  ADHD Other   . ADD / ADHD Other   . Schizophrenia Sister   . Sexual abuse Sister   . ADD / ADHD Sister   . Depression Sister   . Sexual abuse Sister   . Drug abuse Sister   . Anxiety disorder Sister   . Depression Sister   . Drug abuse Sister   . Anesthesia problems Neg Hx     Social History:  Social History   Social History  . Marital status: Married    Spouse name: N/A  . Number of children: N/A  . Years of education: N/A   Social History Main Topics  . Smoking status: Current Every Day Smoker    Packs/day: 0.50    Types: Cigarettes    Last attempt to quit:  08/25/2013  . Smokeless tobacco: Never Used  . Alcohol use 1.2 oz/week    2 Cans of beer per week     Comment: rare  . Drug use: No  . Sexual activity: Yes    Birth control/ protection: IUD   Other Topics Concern  . Not on file   Social History Narrative  . No narrative on file    Allergies:  Allergies  Allergen Reactions  . Latex Hives  . Amoxil [Amoxicillin] Other (See Comments)    Childhood reaction hallucinations  . Penicillins Other (See Comments)    Childhood reaction hallucinations    Metabolic Disorder Labs: Lab Results  Component Value Date   HGBA1C 5.1 03/26/2015   MPG 100 03/26/2015   No results found for: PROLACTIN No results found for: CHOL, TRIG, HDL, CHOLHDL, VLDL, LDLCALC   Current Medications: Current Outpatient Prescriptions  Medication Sig Dispense Refill  . cetirizine (ZYRTEC) 10 MG tablet Take 10 mg by mouth daily.    . clonazePAM (KLONOPIN) 0.5 MG tablet Take 1 tablet (0.5 mg total) by mouth daily as needed for anxiety. 30 tablet 1  . lamoTRIgine (LAMICTAL) 200 MG tablet Take 1 tablet (200 mg total) by mouth daily. 30 tablet 2  . PROAIR HFA 108 (90 Base) MCG/ACT inhaler INHALE 2 PUFFS PO QID PRN  0  . traZODone (DESYREL) 50 MG tablet Take 1 tablet (50 mg total) by mouth at bedtime as needed for sleep. 30 tablet 1  . venlafaxine XR (EFFEXOR-XR) 150 MG 24 hr capsule TAKE 1 CAPSULE(75 MG) BY MOUTH DAILY WITH BREAKFAST 30 capsule 1   No current facility-administered medications for this visit.     Neurologic: Headache: No Seizure: No Paresthesias: No  Musculoskeletal: Strength & Muscle Tone: within normal limits Gait & Station: normal Patient leans: N/A  Psychiatric Specialty Exam: Review of Systems  Skin:       Multiple tattoos on her both arm  Neurological: Negative.     Blood pressure 128/76, pulse 98, weight 177 lb 9.6 oz (80.6 kg).Body mass index is 30.48 kg/m.  General Appearance: Casual and Fairly Groomed  Eye Contact:  Good   Speech:  Fast  Volume:  Normal  Mood:  Anxious  Affect:  Appropriate  Thought Process:  Coherent  Orientation:  Full (Time, Place, and Person)  Thought Content: WDL and Logical   Suicidal Thoughts:  No  Homicidal Thoughts:  No  Memory:  Immediate;   Good Recent;   Good Remote;   Good  Judgement:  Good  Insight:  Good  Psychomotor Activity:  Normal  Concentration:  Concentration: Good and Attention Span: Good  Recall:  Good  Fund of Knowledge: Good  Language: Good  Akathisia:  No  Handed:  Right  AIMS (if indicated):  0  Assets:  Communication Skills Desire for Improvement Housing Resilience  ADL's:  Intact  Cognition: WNL  Sleep:  Good    Assessment: Bipolar disorder, depressed type.  Generalized anxiety disorder  Plan: Patient is doing better since Effexor dose increased.  She does not want to try a different medication since she feels medicine helping her.  Continue Effexor 150 mg daily, Klonopin 0.5 mg as needed for anxiety, continue trazodone 50 mg at bedtime and Lamictal 200 mg daily.  Discussed medication side effects and benefits.  Encourage to see marriage counselor as patient continues to have some marital issues with the husband.  Patient will contacts Seward Grater at [facility] for marriage counseling.  Recommended to call us back if she has any question, concern or if she feels worsening of the symptom.  Follow-up in 3 months. Anabeth Chilcott T., MD 10/27/2016, 3:40 PM

## 2016-11-14 ENCOUNTER — Other Ambulatory Visit (HOSPITAL_COMMUNITY): Payer: Self-pay | Admitting: Psychiatry

## 2016-11-14 DIAGNOSIS — F3131 Bipolar disorder, current episode depressed, mild: Secondary | ICD-10-CM

## 2016-11-29 MED FILL — SUBOXONE 8 MG-2 MG SL FILM: 8-2 | 7 days supply | Qty: 14 | Fill #0

## 2016-11-30 ENCOUNTER — Other Ambulatory Visit (HOSPITAL_COMMUNITY): Payer: Self-pay | Admitting: Psychiatry

## 2016-11-30 DIAGNOSIS — F3131 Bipolar disorder, current episode depressed, mild: Secondary | ICD-10-CM

## 2016-12-06 MED FILL — SUBOXONE 8 MG-2 MG SL FILM: 8-2 | 7 days supply | Qty: 14 | Fill #0

## 2016-12-13 MED FILL — SUBOXONE 8 MG-2 MG SL FILM: 8-2 | 9 days supply | Qty: 18 | Fill #0

## 2016-12-20 MED FILL — SUBOXONE 8 MG-2 MG SL FILM: 8-2 | 17 days supply | Qty: 42 | Fill #0

## 2016-12-27 ENCOUNTER — Other Ambulatory Visit (HOSPITAL_COMMUNITY): Payer: Self-pay | Admitting: Psychiatry

## 2016-12-27 DIAGNOSIS — F3131 Bipolar disorder, current episode depressed, mild: Secondary | ICD-10-CM

## 2017-01-03 MED FILL — SUBOXONE 8 MG-2 MG SL FILM: 8-2 | 28 days supply | Qty: 84 | Fill #0

## 2017-01-25 ENCOUNTER — Encounter: Payer: Self-pay | Admitting: Neurology

## 2017-01-26 ENCOUNTER — Ambulatory Visit (HOSPITAL_COMMUNITY): Payer: Self-pay | Admitting: Psychiatry

## 2017-02-02 MED FILL — SUBOXONE 8 MG-2 MG SL FILM: 8-2 | 14 days supply | Qty: 42 | Fill #0

## 2017-02-15 MED FILL — SUBOXONE 8 MG-2 MG SL FILM: 8-2 | 14 days supply | Qty: 42 | Fill #0

## 2017-02-16 MED FILL — MOVANTIK 25 MG TABLET: 25 | 30 days supply | Qty: 30 | Fill #0

## 2017-02-18 ENCOUNTER — Other Ambulatory Visit (HOSPITAL_COMMUNITY): Payer: Self-pay | Admitting: Psychiatry

## 2017-02-18 DIAGNOSIS — F3131 Bipolar disorder, current episode depressed, mild: Secondary | ICD-10-CM

## 2017-02-21 ENCOUNTER — Ambulatory Visit (INDEPENDENT_AMBULATORY_CARE_PROVIDER_SITE_OTHER): Payer: BLUE CROSS/BLUE SHIELD | Admitting: Neurology

## 2017-02-21 ENCOUNTER — Other Ambulatory Visit: Payer: Self-pay | Admitting: *Deleted

## 2017-02-21 DIAGNOSIS — R200 Anesthesia of skin: Secondary | ICD-10-CM

## 2017-02-21 DIAGNOSIS — G5601 Carpal tunnel syndrome, right upper limb: Secondary | ICD-10-CM

## 2017-02-21 NOTE — Procedures (Signed)
[facility]  [address], [address]  [place], [place] [address] Tel: [phone] Fax:  [phone] Test Date:  02/21/2017  Patient: Tammy RiderCristina Bently DOB: 1986/07/06 Physician: Nita Sickleonika Hakiem Malizia, DO  Sex: Female Height: 5\' 4"  Ref Phys: Pati GalloJames Kramer, M.D.  ID#: [id number] Temp: 36.1C Technician:    Patient Complaints: This is a 31 year-old female referred for right hand paresthesias to evaluate for carpal tunnel syndrome.  NCV & EMG Findings: Extensive electrodiagnostic testing of the right upper extremity shows:  1. Right median sensory nerve shows prolonged distal peak latency (3.7 ms) and reduced amplitude (19.0 V).  Right median motor responses within normal limits.  2. Right ulnar sensory and motor responses are within normal limits.  3. There is no evidence of active or chronic motor axon loss changes affecting any of the tested muscles. Motor unit configuration and recruitment pattern is within normal limits.   Impression: Right median neuropathy at or distal to the wrist, consistent with the clinical diagnosis of carpal tunnel syndrome. Overall, these findings are mild-to-moderate in degree electrically.   ___________________________ Nita Sickleonika Nathyn Luiz, DO    Nerve Conduction Studies Anti Sensory Summary Table   Site NR Peak (ms) Norm Peak (ms) P-T Amp (V) Norm P-T Amp  Right Median Anti Sensory (2nd Digit)  36.1C  Wrist    3.7 <3.4 19.0 >20  Right Ulnar Anti Sensory (5th Digit)  36.1C  Wrist    2.3 <3.1 34.1 >12   Motor Summary Table   Site NR Onset (ms) Norm Onset (ms) O-P Amp (mV) Norm O-P Amp Site1 Site2 Delta-0 (ms) Dist (cm) Vel (m/s) Norm Vel (m/s)  Right Median Motor (Abd Poll Brev)  36.1C  Wrist    3.5 <3.9 10.9 >6 Elbow Wrist 4.5 24.0 53 >50  Elbow    8.0  10.6         Right Ulnar Motor (Abd Dig Minimi)  36.1C  Wrist    2.0 <3.1 12.2 >7 B Elbow Wrist 2.9 20.0 69 >50  B Elbow    4.9  11.4  A Elbow B Elbow 1.6 10.0 63 >50  A Elbow    6.5  11.4           EMG   Side Muscle Ins Act Fibs Psw Fasc Number Recrt Dur Dur. Amp Amp. Poly Poly. Comment  Right 1stDorInt Nml Nml Nml Nml Nml Nml Nml Nml Nml Nml Nml Nml N/A  Right Abd Poll Brev Nml Nml Nml Nml Nml Nml Nml Nml Nml Nml Nml Nml N/A  Right PronatorTeres Nml Nml Nml Nml Nml Nml Nml Nml Nml Nml Nml Nml N/A  Right Biceps Nml Nml Nml Nml Nml Nml Nml Nml Nml Nml Nml Nml N/A  Right Triceps Nml Nml Nml Nml Nml Nml Nml Nml Nml Nml Nml Nml N/A  Right Deltoid Nml Nml Nml Nml Nml Nml Nml Nml Nml Nml Nml Nml N/A      Waveforms:

## 2017-02-27 ENCOUNTER — Other Ambulatory Visit (HOSPITAL_COMMUNITY): Payer: Self-pay | Admitting: Psychiatry

## 2017-02-28 ENCOUNTER — Ambulatory Visit (HOSPITAL_COMMUNITY): Payer: Self-pay | Admitting: Psychiatry

## 2017-03-01 ENCOUNTER — Other Ambulatory Visit (HOSPITAL_COMMUNITY): Payer: Self-pay

## 2017-03-01 DIAGNOSIS — F411 Generalized anxiety disorder: Secondary | ICD-10-CM

## 2017-03-01 DIAGNOSIS — F3131 Bipolar disorder, current episode depressed, mild: Secondary | ICD-10-CM

## 2017-03-01 MED ORDER — CLONAZEPAM 0.5 MG PO TABS: 0.5000 mg | ORAL_TABLET | Freq: Every day | ORAL | 0 refills | Status: DC | PRN

## 2017-03-01 MED FILL — SUBOXONE 8 MG-2 MG SL FILM: 8-2 | 28 days supply | Qty: 84 | Fill #0

## 2017-03-14 ENCOUNTER — Ambulatory Visit (INDEPENDENT_AMBULATORY_CARE_PROVIDER_SITE_OTHER): Payer: BLUE CROSS/BLUE SHIELD | Admitting: Psychiatry

## 2017-03-14 ENCOUNTER — Encounter (HOSPITAL_COMMUNITY): Payer: Self-pay | Admitting: Psychiatry

## 2017-03-14 VITALS — BP 134/78 | HR 130 | Ht 64.0 in | Wt 176.2 lb

## 2017-03-14 DIAGNOSIS — F3131 Bipolar disorder, current episode depressed, mild: Secondary | ICD-10-CM | POA: Diagnosis not present

## 2017-03-14 DIAGNOSIS — Z811 Family history of alcohol abuse and dependence: Secondary | ICD-10-CM

## 2017-03-14 DIAGNOSIS — F603 Borderline personality disorder: Secondary | ICD-10-CM

## 2017-03-14 DIAGNOSIS — Z818 Family history of other mental and behavioral disorders: Secondary | ICD-10-CM | POA: Diagnosis not present

## 2017-03-14 DIAGNOSIS — M549 Dorsalgia, unspecified: Secondary | ICD-10-CM

## 2017-03-14 DIAGNOSIS — F1110 Opioid abuse, uncomplicated: Secondary | ICD-10-CM

## 2017-03-14 DIAGNOSIS — Z813 Family history of other psychoactive substance abuse and dependence: Secondary | ICD-10-CM | POA: Diagnosis not present

## 2017-03-14 DIAGNOSIS — F17210 Nicotine dependence, cigarettes, uncomplicated: Secondary | ICD-10-CM

## 2017-03-14 DIAGNOSIS — Z975 Presence of (intrauterine) contraceptive device: Secondary | ICD-10-CM

## 2017-03-14 DIAGNOSIS — F411 Generalized anxiety disorder: Secondary | ICD-10-CM

## 2017-03-14 DIAGNOSIS — F909 Attention-deficit hyperactivity disorder, unspecified type: Secondary | ICD-10-CM

## 2017-03-14 MED ORDER — ARIPIPRAZOLE 2 MG PO TABS: 2.0000 mg | ORAL_TABLET | Freq: Every day | ORAL | 1 refills | Status: DC

## 2017-03-14 MED ORDER — LAMOTRIGINE 200 MG PO TABS: 200.0000 mg | ORAL_TABLET | Freq: Every day | ORAL | 1 refills | Status: DC

## 2017-03-14 MED ORDER — CLONAZEPAM 0.5 MG PO TABS: 0.5000 mg | ORAL_TABLET | Freq: Every day | ORAL | 1 refills | Status: DC | PRN

## 2017-03-14 MED ORDER — VENLAFAXINE HCL ER 150 MG PO CP24: ORAL_CAPSULE | ORAL | 1 refills | Status: DC

## 2017-03-14 NOTE — Progress Notes (Signed)
BH MD/PA/NP OP Progress Note  03/14/2017 2:21 PM Tammy Wilkinson  MRN:  [id number]  Chief Complaint:  I apologize I did not inform you that I was using pain medication.  Now I'm getting Suboxone from Dr. Agustin Cree and also seeing a therapist.  HPI: Tammy Wilkinson came for her follow-up appointment.  She apologized not telling me that she has been using Vicodin for a while until she realized that she needs some help.  Initially she was prescribed for her back pain then she started to use from the streets.  She discussed with her husband and family about her situation were very supportive.  She is now seeing [facility] and receiving Suboxone area since then she has not use any Vicodin.  She is also seeing therapist Tressie Stalker who is addiction specialist.  Today she is nervous when she was talking about it.  She endorse her relationship with the husband is also improved.  Her husband is also seeing [facility] May for counseling.  She admitted some time poor sleep and racing thoughts but denies any suicidal thoughts.  She believe her medicine needs to be adjusted because she still have residual mood lability and some time hopelessness.  However she denies any suicidal thoughts or homicidal thought.  She is trying to lose weight but despite watching her calories she has been unable to lose weight.  She lives with her husband and 3 children.  Patient denies any paranoia, hallucination or any aggressive behavior.  Her appetite is okay.  Her energy level is fair.  Patient denies any illegal substance use.  Visit Diagnosis:    ICD-10-CM   1. Bipolar affective disorder, currently depressed, mild (HCC) F31.31 venlafaxine XR (EFFEXOR-XR) 150 MG 24 hr capsule    lamoTRIgine (LAMICTAL) 200 MG tablet    ARIPiprazole (ABILIFY) 2 MG tablet    clonazePAM (KLONOPIN) 0.5 MG tablet    DISCONTINUED: clonazePAM (KLONOPIN) 0.5 MG tablet  2. Generalized anxiety disorder F41.1 clonazePAM (KLONOPIN)  0.5 MG tablet    DISCONTINUED: clonazePAM (KLONOPIN) 0.5 MG tablet    Past Psychiatric History: Reviewed. Patient seeing psychiatrist since age 11 when her parents got divorced. She has history of cutting herself. She has seen in the past Dr. Ladona Ridgel and psychiatrist at [facility]. She was diagnosed with ADHD, bipolar disorder, borderline personality disorder, depression and anxiety disorder. In the past she has taken Effexor, BuSpar, gabapentin , Vistaril , Prozac, Cymbalta, Seroquel, trazodone, Zoloft, Lexapro, Klonopin, Abilify, Valium, Adderall, Xanax and Klonopin. Patient has history of mood swing, impulsive behavior, sexually inappropriate behavior, agitation and anger issues. Patient denies any history of psychiatric inpatient treatment or any suicidal attempt.  Patient also reported using pain medication.  Past Medical History:  Past Medical History:  Diagnosis Date  . ADHD (attention deficit hyperactivity disorder)   . Allergy   . Anemia   . Anxiety   . Asthma    pt states when she was a child  . Concussion 12/30/14  . Depression   . Headache(784.0)    Hx; of migraines  . Heartburn    hx:  . Pneumonia   . Shortness of breath     Past Surgical History:  Procedure Laterality Date  . LARYNGOPLASTY Left 03/29/2013   Procedure: VOCAL CORD MEDIALIZATION;  Surgeon: Serena Colonel, MD;  Location: [facility];  Service: ENT;  Laterality: Left;  with placement of silastic block  . WISDOM TOOTH EXTRACTION      Family Psychiatric History: Reviewed.  Family  History:  Family History  Problem Relation Age of Onset  . Hypertension Mother   . Anxiety disorder Mother   . Depression Mother   . Physical abuse Mother   . Hypertension Father   . Drug abuse Sister   . Anxiety disorder Sister   . Depression Sister   . Bipolar disorder Sister   . Sexual abuse Sister   . Alcohol abuse Brother   . Drug abuse Brother   . Depression Brother   . Anxiety disorder Brother   . ADD / ADHD Other    . ADD / ADHD Other   . Schizophrenia Sister   . Sexual abuse Sister   . ADD / ADHD Sister   . Depression Sister   . Sexual abuse Sister   . Drug abuse Sister   . Anxiety disorder Sister   . Depression Sister   . Drug abuse Sister   . Anesthesia problems Neg Hx     Social History:  Social History   Social History  . Marital status: Married    Spouse name: N/A  . Number of children: N/A  . Years of education: N/A   Social History Main Topics  . Smoking status: Current Every Day Smoker    Packs/day: 0.50    Types: Cigarettes    Last attempt to quit: 08/25/2013  . Smokeless tobacco: Never Used  . Alcohol use 1.2 oz/week    2 Cans of beer per week     Comment: rare  . Drug use: No  . Sexual activity: Yes    Birth control/ protection: IUD   Other Topics Concern  . Not on file   Social History Narrative  . No narrative on file    Allergies:  Allergies  Allergen Reactions  . Latex Hives  . Amoxil [Amoxicillin] Other (See Comments)    Childhood reaction hallucinations  . Penicillins Other (See Comments)    Childhood reaction hallucinations    Metabolic Disorder Labs: Lab Results  Component Value Date   HGBA1C 5.1 03/26/2015   MPG 100 03/26/2015   No results found for: PROLACTIN No results found for: CHOL, TRIG, HDL, CHOLHDL, VLDL, LDLCALC Lab Results  Component Value Date   TSH 1.396 03/26/2015   TSH 1.273 03/01/2012    Therapeutic Level Labs: No results found for: LITHIUM No results found for: VALPROATE No components found for:  CBMZ  Current Medications: Current Outpatient Prescriptions  Medication Sig Dispense Refill  . cetirizine (ZYRTEC) 10 MG tablet Take 10 mg by mouth daily.    . clonazePAM (KLONOPIN) 0.5 MG tablet Take 1 tablet (0.5 mg total) by mouth daily as needed for anxiety. 30 tablet 0  . lamoTRIgine (LAMICTAL) 200 MG tablet TAKE 1 TABLET BY MOUTH DAILY 30 tablet 0  . PROAIR HFA 108 (90 Base) MCG/ACT inhaler INHALE 2 PUFFS PO QID PRN   0  . traZODone (DESYREL) 50 MG tablet TAKE 1 TABLET BY MOUTH AT BEDTIME AS NEEDED FOR SLEEP 30 tablet 0  . venlafaxine XR (EFFEXOR-XR) 150 MG 24 hr capsule TAKE ONE CAPSULE BY MOUTH EVERY DAY WITH BREAKFAST 30 capsule 0   No current facility-administered medications for this visit.      Musculoskeletal: Strength & Muscle Tone: within normal limits Gait & Station: normal Patient leans: N/A  Psychiatric Specialty Exam: Review of Systems  Constitutional: Negative.   HENT: Negative.   Cardiovascular: Negative.   Gastrointestinal: Negative.   Genitourinary: Negative.   Musculoskeletal: Positive for back pain.  Skin:  Negative.  Negative for itching and rash.  Neurological: Positive for tingling.    Blood pressure 134/78, pulse (!) 130, height 5\' 4"  (1.626 m), weight 176 lb 3.2 oz (79.9 kg).There is no height or weight on file to calculate BMI.  General Appearance: Casual and Multiple tattoos in her arm  Eye Contact:  Fair  Speech:  Clear and Coherent  Volume:  Normal  Mood:  Anxious  Affect:  Appropriate  Thought Process:  Goal Directed  Orientation:  Full (Time, Place, and Person)  Thought Content: Rumination   Suicidal Thoughts:  No  Homicidal Thoughts:  No  Memory:  Immediate;   Good Recent;   Good Remote;   Good  Judgement:  Fair  Insight:  Good  Psychomotor Activity:  Increased  Concentration:  Concentration: Fair and Attention Span: Fair  Recall:  Good  Fund of Knowledge: Good  Language: Good  Akathisia:  No  Handed:  Right  AIMS (if indicated): not done  Assets:  Communication Skills Desire for Improvement Resilience Social Support Transportation  ADL's:  Intact  Cognition: WNL  Sleep:  Fair   Screenings:  Assessment: Bipolar disorder, depressed type.  Anxiety disorder NOS.  Opiate abuse  Plan: Discuss recent information as patient mentioned that she had been using Vicodin and she was not forthcoming in previous visits.  She apologizes but like to get  better.  She is seeing Dr. Agustin Cree for Suboxone and also seeing a therapist.  She is more focused to get treatment.  She still have mood lability and I recommended to try low-dose Lamictal.  We discussed benzodiazepine use with opiates but she has never requested early refills.  I will continue Klonopin 0.5 mg which she takes as needed.  I will continue Effexor XR 150 mg daily and Lamictal 200 mg daily.  Recommended to take trazodone if she cannot sleep with Abilify.  Recommended to call us back if she has any question or any concern.  Patient does not need any CD IOP because she is seeing an Network engineer.  I will see her again in 2 months.  Discuss safety concern that anytime having active suicidal thoughts or homicidal thought that she need to call 911 or go to the local emergency room.  Sharyon Peitz T., MD 03/14/2017, 2:21 PM

## 2017-03-28 MED FILL — AMITIZA 24 MCG CAPSULES: 24 | 30 days supply | Qty: 60 | Fill #0

## 2017-03-28 MED FILL — SUBOXONE 8 MG-2 MG SL FILM: 8-2 | 14 days supply | Qty: 42 | Fill #0

## 2017-03-29 ENCOUNTER — Other Ambulatory Visit (HOSPITAL_COMMUNITY): Payer: Self-pay | Admitting: Psychiatry

## 2017-03-29 DIAGNOSIS — F3131 Bipolar disorder, current episode depressed, mild: Secondary | ICD-10-CM

## 2017-03-31 ENCOUNTER — Other Ambulatory Visit (HOSPITAL_COMMUNITY): Payer: Self-pay | Admitting: Psychiatry

## 2017-03-31 DIAGNOSIS — F3131 Bipolar disorder, current episode depressed, mild: Secondary | ICD-10-CM

## 2017-03-31 MED ORDER — TRAZODONE HCL 50 MG PO TABS: 50.0000 mg | ORAL_TABLET | Freq: Every day | ORAL | 0 refills | Status: DC

## 2017-04-11 MED FILL — SUBOXONE 8 MG-2 MG SL FILM: 8-2 | 14 days supply | Qty: 42 | Fill #0

## 2017-04-29 MED FILL — AMITIZA 24 MCG CAPSULES: 24 | 30 days supply | Qty: 60 | Fill #1

## 2017-05-01 ENCOUNTER — Other Ambulatory Visit (HOSPITAL_COMMUNITY): Payer: Self-pay

## 2017-05-01 DIAGNOSIS — F3131 Bipolar disorder, current episode depressed, mild: Secondary | ICD-10-CM

## 2017-05-01 MED ORDER — TRAZODONE HCL 50 MG PO TABS: 50.0000 mg | ORAL_TABLET | Freq: Every day | ORAL | 0 refills | Status: DC

## 2017-05-02 MED FILL — SUBOXONE 8 MG-2 MG SL FILM: 8-2 | 14 days supply | Qty: 42 | Fill #0

## 2017-05-15 ENCOUNTER — Other Ambulatory Visit (HOSPITAL_COMMUNITY): Payer: Self-pay

## 2017-05-15 ENCOUNTER — Ambulatory Visit (HOSPITAL_COMMUNITY): Payer: Self-pay | Admitting: Psychiatry

## 2017-05-15 DIAGNOSIS — F3131 Bipolar disorder, current episode depressed, mild: Secondary | ICD-10-CM

## 2017-05-15 DIAGNOSIS — F411 Generalized anxiety disorder: Secondary | ICD-10-CM

## 2017-05-15 MED ORDER — VENLAFAXINE HCL ER 150 MG PO CP24: ORAL_CAPSULE | ORAL | 0 refills | Status: DC

## 2017-05-15 MED ORDER — LAMOTRIGINE 200 MG PO TABS: 200.0000 mg | ORAL_TABLET | Freq: Every day | ORAL | 0 refills | Status: DC

## 2017-05-15 MED ORDER — TRAZODONE HCL 50 MG PO TABS: 50.0000 mg | ORAL_TABLET | Freq: Every day | ORAL | 0 refills | Status: DC

## 2017-05-15 MED ORDER — ARIPIPRAZOLE 2 MG PO TABS: 2.0000 mg | ORAL_TABLET | Freq: Every day | ORAL | 0 refills | Status: DC

## 2017-05-15 MED ORDER — CLONAZEPAM 0.5 MG PO TABS: 0.5000 mg | ORAL_TABLET | Freq: Every day | ORAL | 0 refills | Status: DC | PRN

## 2017-05-29 ENCOUNTER — Other Ambulatory Visit (HOSPITAL_COMMUNITY): Payer: Self-pay | Admitting: Psychiatry

## 2017-05-29 DIAGNOSIS — F3131 Bipolar disorder, current episode depressed, mild: Secondary | ICD-10-CM

## 2017-05-29 DIAGNOSIS — F411 Generalized anxiety disorder: Secondary | ICD-10-CM

## 2017-05-31 ENCOUNTER — Ambulatory Visit (INDEPENDENT_AMBULATORY_CARE_PROVIDER_SITE_OTHER): Payer: BLUE CROSS/BLUE SHIELD | Admitting: Psychiatry

## 2017-05-31 ENCOUNTER — Encounter (HOSPITAL_COMMUNITY): Payer: Self-pay | Admitting: Psychiatry

## 2017-05-31 DIAGNOSIS — Z79899 Other long term (current) drug therapy: Secondary | ICD-10-CM | POA: Diagnosis not present

## 2017-05-31 DIAGNOSIS — Z813 Family history of other psychoactive substance abuse and dependence: Secondary | ICD-10-CM | POA: Diagnosis not present

## 2017-05-31 DIAGNOSIS — F3131 Bipolar disorder, current episode depressed, mild: Secondary | ICD-10-CM

## 2017-05-31 DIAGNOSIS — Z818 Family history of other mental and behavioral disorders: Secondary | ICD-10-CM

## 2017-05-31 DIAGNOSIS — F17210 Nicotine dependence, cigarettes, uncomplicated: Secondary | ICD-10-CM | POA: Diagnosis not present

## 2017-05-31 DIAGNOSIS — F411 Generalized anxiety disorder: Secondary | ICD-10-CM

## 2017-05-31 MED ORDER — CLONAZEPAM 0.5 MG PO TABS: 0.5000 mg | ORAL_TABLET | Freq: Every day | ORAL | 0 refills | Status: DC | PRN

## 2017-05-31 MED ORDER — TRAZODONE HCL 50 MG PO TABS: 50.0000 mg | ORAL_TABLET | Freq: Every day | ORAL | 0 refills | Status: DC

## 2017-05-31 MED ORDER — LAMOTRIGINE 200 MG PO TABS: 200.0000 mg | ORAL_TABLET | Freq: Every day | ORAL | 2 refills | Status: DC

## 2017-05-31 MED ORDER — VENLAFAXINE HCL ER 150 MG PO CP24: ORAL_CAPSULE | ORAL | 2 refills | Status: DC

## 2017-05-31 MED ORDER — ARIPIPRAZOLE 2 MG PO TABS: 2.0000 mg | ORAL_TABLET | Freq: Every day | ORAL | 2 refills | Status: DC

## 2017-05-31 NOTE — Progress Notes (Signed)
BH MD/PA/NP OP Progress Note  05/31/2017 1:22 PM Tammy Wilkinson  MRN:  [id number]  Chief Complaint: I like Abilify.  It is helping my mood.  HPI: Patient came for her follow-up appointment.  We started her on low-dose Abilify and that seems to be working very well.  She denies any irritability, anger or mood swings.  Currently she is in the process of moving.  They want to sell their house so can get a bigger house.  She is getting Suboxone from Dr. Agustin Cree.  She is hoping to stop the Suboxone because she wants to cut off from pain medication.  She is seeing therapist.  She denies any irritability, anger, mania or psychosis.  Her relationship with her husband is much better.  Patient denies any suicidal thoughts or homicidal thoughts.  She has a plan to spend Thanksgiving dinner with her husband's family.  Patient lives with her husband and 3 children.  Patient is compliant with Effexor, Lamictal and Klonopin.  She has no rash, itching, tremors or shakes.  Visit Diagnosis:    ICD-10-CM   1. Bipolar affective disorder, currently depressed, mild (HCC) F31.31 venlafaxine XR (EFFEXOR-XR) 150 MG 24 hr capsule    traZODone (DESYREL) 50 MG tablet    lamoTRIgine (LAMICTAL) 200 MG tablet    ARIPiprazole (ABILIFY) 2 MG tablet    clonazePAM (KLONOPIN) 0.5 MG tablet  2. Generalized anxiety disorder F41.1 clonazePAM (KLONOPIN) 0.5 MG tablet    Past Psychiatric History: Viewed. Patient seeing psychiatrist since age 74 when her parents got divorced. She has history of cutting herself. She has seen in the past Dr. Ladona Ridgel and psychiatrist at [facility]. She was diagnosed with ADHD, bipolar disorder, borderline personality disorder, depression and anxiety disorder. In the past she has taken Effexor, BuSpar, gabapentin , Vistaril , Prozac, Cymbalta, Seroquel, trazodone, Zoloft, Lexapro, Klonopin, Abilify, Valium, Adderall, Xanax and Klonopin. Patient has history of mood swing, impulsive behavior, sexually  inappropriate behavior, agitation and anger issues. Patient denies any history of psychiatric inpatient treatment or any suicidal attempt.  Patient also reported using pain medication.  Past Medical History:  Past Medical History:  Diagnosis Date  . ADHD (attention deficit hyperactivity disorder)   . Allergy   . Anemia   . Anxiety   . Asthma    pt states when she was a child  . Concussion 12/30/14  . Depression   . Headache(784.0)    Hx; of migraines  . Heartburn    hx:  . Pneumonia   . Shortness of breath     Past Surgical History:  Procedure Laterality Date  . WISDOM TOOTH EXTRACTION      Family Psychiatric History: Reviewed.  Family History:  Family History  Problem Relation Age of Onset  . Hypertension Mother   . Anxiety disorder Mother   . Depression Mother   . Physical abuse Mother   . Hypertension Father   . Drug abuse Sister   . Anxiety disorder Sister   . Depression Sister   . Bipolar disorder Sister   . Sexual abuse Sister   . Alcohol abuse Brother   . Drug abuse Brother   . Depression Brother   . Anxiety disorder Brother   . ADD / ADHD Other   . ADD / ADHD Other   . Schizophrenia Sister   . Sexual abuse Sister   . ADD / ADHD Sister   . Depression Sister   . Sexual abuse Sister   . Drug abuse Sister   .  Anxiety disorder Sister   . Depression Sister   . Drug abuse Sister   . Anesthesia problems Neg Hx     Social History:  Social History   Socioeconomic History  . Marital status: Married    Spouse name: None  . Number of children: None  . Years of education: None  . Highest education level: None  Social Needs  . Financial resource strain: None  . Food insecurity - worry: None  . Food insecurity - inability: None  . Transportation needs - medical: None  . Transportation needs - non-medical: None  Occupational History  . None  Tobacco Use  . Smoking status: Current Every Day Smoker    Packs/day: 0.50    Types: Cigarettes    Last  attempt to quit: 08/25/2013    Years since quitting: 3.7  . Smokeless tobacco: Never Used  Substance and Sexual Activity  . Alcohol use: Yes    Alcohol/week: 1.2 oz    Types: 2 Cans of beer per week    Comment: rare  . Drug use: No  . Sexual activity: Yes    Birth control/protection: IUD  Other Topics Concern  . None  Social History Narrative  . None    Allergies:  Allergies  Allergen Reactions  . Latex Hives  . Amoxil [Amoxicillin] Other (See Comments)    Childhood reaction hallucinations  . Penicillins Other (See Comments)    Childhood reaction hallucinations    Metabolic Disorder Labs: Lab Results  Component Value Date   HGBA1C 5.1 03/26/2015   MPG 100 03/26/2015   No results found for: PROLACTIN No results found for: CHOL, TRIG, HDL, CHOLHDL, VLDL, LDLCALC Lab Results  Component Value Date   TSH 1.396 03/26/2015   TSH 1.273 03/01/2012    Therapeutic Level Labs: No results found for: LITHIUM No results found for: VALPROATE No components found for:  CBMZ  Current Medications: Current Outpatient Medications  Medication Sig Dispense Refill  . ARIPiprazole (ABILIFY) 2 MG tablet Take 1 tablet (2 mg total) by mouth daily. 30 tablet 0  . cetirizine (ZYRTEC) 10 MG tablet Take 10 mg by mouth daily.    . clonazePAM (KLONOPIN) 0.5 MG tablet Take 1 tablet (0.5 mg total) by mouth daily as needed for anxiety. 30 tablet 0  . DUEXIS 800-26.6 MG TABS Take 1 tablet by mouth 3 (three) times daily.  3  . lamoTRIgine (LAMICTAL) 200 MG tablet Take 1 tablet (200 mg total) by mouth daily. 30 tablet 0  . lubiprostone (AMITIZA) 8 MCG capsule Take 8 mcg by mouth 2 (two) times daily with a meal.    . PROAIR HFA 108 (90 Base) MCG/ACT inhaler INHALE 2 PUFFS PO QID PRN  0  . SUBOXONE 8-2 MG FILM   0  . traZODone (DESYREL) 50 MG tablet Take 1 tablet (50 mg total) by mouth at bedtime. 30 tablet 0  . venlafaxine XR (EFFEXOR-XR) 150 MG 24 hr capsule TAKE ONE CAPSULE BY MOUTH EVERY DAY WITH  BREAKFAST 30 capsule 0   No current facility-administered medications for this visit.      Musculoskeletal: Strength & Muscle Tone: within normal limits Gait & Station: normal Patient leans: N/A  Psychiatric Specialty Exam: ROS  Blood pressure 138/90, pulse 93, height 5\' 4"  (1.626 m), weight 178 lb (80.7 kg), last menstrual period 05/30/2017.Body mass index is 30.55 kg/m.  General Appearance: Casual  Eye Contact:  Good  Speech:  fastbut coherent  Volume:  Normal  Mood:  Euthymic  Affect:  Appropriate  Thought Process:  Goal Directed  Orientation:  Full (Time, Place, and Person)  Thought Content: Logical   Suicidal Thoughts:  No  Homicidal Thoughts:  No  Memory:  Immediate;   Good Recent;   Good Remote;   Good  Judgement:  Good  Insight:  Good  Psychomotor Activity:  Increased  Concentration:  Concentration: Good and Attention Span: Good  Recall:  Good  Fund of Knowledge: Good  Language: Good  Akathisia:  No  Handed:  Right  AIMS (if indicated): not done  Assets:  Communication Skills Desire for Improvement Housing Resilience  ADL's:  Intact  Cognition: WNL  Sleep:  Good   Screenings:   Assessment and Plan: Bipolar disorder, depressed type.  Anxiety disorder NOS.  Patient doing better with the low-dose Abilify.  Though she is seeing Dr. Agustin CreeNewkirk for Suboxone but hoping to stop Suboxone in the future.  She is seeing therapist and that is working very well.  She has no side effects.  I will continue Abilify 2 mg daily, Klonopin 0.5 mg at bedtime, trazodone 50 mg at bedtime, Effexor XR 150 mg daily and Lamictal 200 mg daily.  Discussed medication side effects and benefits.  She has no rash, itching or tremors.  Recommended to call us back if she has any question or any concern.  Follow-up in 3 months.   Dafina Suk T., MD 05/31/2017, 1:22 PM

## 2017-07-31 ENCOUNTER — Other Ambulatory Visit (HOSPITAL_COMMUNITY): Payer: Self-pay | Admitting: Psychiatry

## 2017-07-31 ENCOUNTER — Telehealth (HOSPITAL_COMMUNITY): Payer: Self-pay

## 2017-07-31 DIAGNOSIS — F411 Generalized anxiety disorder: Secondary | ICD-10-CM

## 2017-07-31 DIAGNOSIS — F3131 Bipolar disorder, current episode depressed, mild: Secondary | ICD-10-CM

## 2017-07-31 MED ORDER — CLONAZEPAM 0.5 MG PO TABS: 0.5000 mg | ORAL_TABLET | Freq: Every day | ORAL | 0 refills | Status: DC | PRN

## 2017-07-31 MED ORDER — TRAZODONE HCL 50 MG PO TABS: 50.0000 mg | ORAL_TABLET | Freq: Every day | ORAL | 0 refills | Status: DC

## 2017-07-31 NOTE — Telephone Encounter (Signed)
Telephone call with patient's [facility] on [address] to provide authorized one time refills of patient's prescribed Klonopin 0.5 mg, one a day as needed for anxiety, #30 with no refills and Trazodone 50 mg, one a day at bedtime, #30 with no refills per Dr. Robert BellowArfeens instruction and approval this date.

## 2017-07-31 NOTE — Telephone Encounter (Signed)
Okay to provide 30 day supply. 

## 2017-07-31 NOTE — Telephone Encounter (Signed)
Medication refill requests - Faxes received from patient's [facility] for a refill of her Klonopin and Trazodone, both prescribed for a one time 30 day order 05/31/17 and patient does not return until 08/31/17.

## 2017-08-31 ENCOUNTER — Ambulatory Visit (INDEPENDENT_AMBULATORY_CARE_PROVIDER_SITE_OTHER): Payer: BLUE CROSS/BLUE SHIELD | Admitting: Psychiatry

## 2017-08-31 ENCOUNTER — Encounter (HOSPITAL_COMMUNITY): Payer: Self-pay | Admitting: Psychiatry

## 2017-08-31 DIAGNOSIS — F411 Generalized anxiety disorder: Secondary | ICD-10-CM | POA: Diagnosis not present

## 2017-08-31 DIAGNOSIS — Z915 Personal history of self-harm: Secondary | ICD-10-CM | POA: Diagnosis not present

## 2017-08-31 DIAGNOSIS — F17210 Nicotine dependence, cigarettes, uncomplicated: Secondary | ICD-10-CM | POA: Diagnosis not present

## 2017-08-31 DIAGNOSIS — F3131 Bipolar disorder, current episode depressed, mild: Secondary | ICD-10-CM

## 2017-08-31 DIAGNOSIS — Z811 Family history of alcohol abuse and dependence: Secondary | ICD-10-CM | POA: Diagnosis not present

## 2017-08-31 DIAGNOSIS — Z818 Family history of other mental and behavioral disorders: Secondary | ICD-10-CM | POA: Diagnosis not present

## 2017-08-31 MED ORDER — ARIPIPRAZOLE 2 MG PO TABS: 2.0000 mg | ORAL_TABLET | Freq: Every day | ORAL | 2 refills | Status: DC

## 2017-08-31 MED ORDER — VENLAFAXINE HCL ER 150 MG PO CP24: ORAL_CAPSULE | ORAL | 2 refills | Status: DC

## 2017-08-31 MED ORDER — CLONAZEPAM 0.5 MG PO TABS: 0.5000 mg | ORAL_TABLET | Freq: Every day | ORAL | 2 refills | Status: DC | PRN

## 2017-08-31 MED ORDER — LAMOTRIGINE 200 MG PO TABS: 200.0000 mg | ORAL_TABLET | Freq: Every day | ORAL | 2 refills | Status: DC

## 2017-08-31 MED ORDER — TRAZODONE HCL 50 MG PO TABS: 50.0000 mg | ORAL_TABLET | Freq: Every day | ORAL | 2 refills | Status: DC

## 2017-08-31 NOTE — Progress Notes (Signed)
BH MD/PA/NP OP Progress Note  08/31/2017 1:56 PM Tammy Wilkinson  MRN:  [id number]  Chief Complaint: I am doing good.  We sold the house and now I am living with my parents.  HPI: Patient came for her follow-up appointment.  She is taking her medication and denies any side effects.  Patient told he sold the house and now everyone living separately until they find a bigger house.  She is living with her parents, her husband living with his uncle.  Patient is happy because the able to make good money when they sold the house but now they are looking for a bigger house.  She is taking her medication and denies any irritability, anger, mania or any psychosis.  She is getting Suboxone from Dr. Agustin Cree.  She admitted weight gain since the last visit.  She admitted eating very late and not going to the exercise.  She started working part-time at [employer] and did not get home very late.  She is hoping her job continues after the tax season.  Patient denies drinking alcohol or using any illegal substances.  Her anxiety is much better since we added the Abilify.  Visit Diagnosis:    ICD-10-CM   1. Bipolar affective disorder, currently depressed, mild (HCC) F31.31 venlafaxine XR (EFFEXOR-XR) 150 MG 24 hr capsule    traZODone (DESYREL) 50 MG tablet    lamoTRIgine (LAMICTAL) 200 MG tablet    ARIPiprazole (ABILIFY) 2 MG tablet    clonazePAM (KLONOPIN) 0.5 MG tablet  2. Generalized anxiety disorder F41.1 clonazePAM (KLONOPIN) 0.5 MG tablet    Past Psychiatric History: Viewed. Patient seeing psychiatrist since age 20 when her parents got divorced. She has history of cutting herself. She has seen in the past Dr. Ladona Ridgel and psychiatrist at [facility]. She was diagnosed with ADHD, bipolar disorder, borderlinepersonality disorder, depression and anxiety disorder. In the past she has taken Effexor, BuSpar, gabapentin , Vistaril ,Prozac, Cymbalta, Seroquel, trazodone, Zoloft, Lexapro, Klonopin, Abilify, Valium,  Adderall, Xanax and Klonopin. Patient has history of mood swing, impulsive behavior, sexually inappropriate behavior, agitation and anger issues. Patient denies any history of psychiatric inpatient treatment or any suicidal attempt.Patient also reported using pain medication.  Past Medical History:  Past Medical History:  Diagnosis Date  . ADHD (attention deficit hyperactivity disorder)   . Allergy   . Anemia   . Anxiety   . Asthma    pt states when she was a child  . Concussion 12/30/14  . Depression   . Headache(784.0)    Hx; of migraines  . Heartburn    hx:  . Pneumonia   . Shortness of breath     Past Surgical History:  Procedure Laterality Date  . LARYNGOPLASTY Left 03/29/2013   Procedure: VOCAL CORD MEDIALIZATION;  Surgeon: Serena Colonel, MD;  Location: [facility] OR;  Service: ENT;  Laterality: Left;  with placement of silastic block  . WISDOM TOOTH EXTRACTION      Family Psychiatric History: Reviewed.  Family History:  Family History  Problem Relation Age of Onset  . Hypertension Mother   . Anxiety disorder Mother   . Depression Mother   . Physical abuse Mother   . Hypertension Father   . Drug abuse Sister   . Anxiety disorder Sister   . Depression Sister   . Bipolar disorder Sister   . Sexual abuse Sister   . Alcohol abuse Brother   . Drug abuse Brother   . Depression Brother   . Anxiety disorder Brother   .  ADD / ADHD Other   . ADD / ADHD Other   . Schizophrenia Sister   . Sexual abuse Sister   . ADD / ADHD Sister   . Depression Sister   . Sexual abuse Sister   . Drug abuse Sister   . Anxiety disorder Sister   . Depression Sister   . Drug abuse Sister   . Anesthesia problems Neg Hx     Social History:  Social History   Socioeconomic History  . Marital status: Married    Spouse name: None  . Number of children: None  . Years of education: None  . Highest education level: None  Social Needs  . Financial resource strain: None  . Food insecurity -  worry: None  . Food insecurity - inability: None  . Transportation needs - medical: None  . Transportation needs - non-medical: None  Occupational History  . None  Tobacco Use  . Smoking status: Current Every Day Smoker    Packs/day: 0.50    Types: Cigarettes    Last attempt to quit: 08/25/2013    Years since quitting: 4.0  . Smokeless tobacco: Never Used  Substance and Sexual Activity  . Alcohol use: Yes    Alcohol/week: 1.2 oz    Types: 2 Cans of beer per week    Comment: rare  . Drug use: No  . Sexual activity: Yes    Birth control/protection: IUD  Other Topics Concern  . None  Social History Narrative  . None    Allergies:  Allergies  Allergen Reactions  . Latex Hives  . Amoxil [Amoxicillin] Other (See Comments)    Childhood reaction hallucinations  . Penicillins Other (See Comments)    Childhood reaction hallucinations    Metabolic Disorder Labs: Lab Results  Component Value Date   HGBA1C 5.1 03/26/2015   MPG 100 03/26/2015   No results found for: PROLACTIN No results found for: CHOL, TRIG, HDL, CHOLHDL, VLDL, LDLCALC Lab Results  Component Value Date   TSH 1.396 03/26/2015   TSH 1.273 03/01/2012    Therapeutic Level Labs: No results found for: LITHIUM No results found for: VALPROATE No components found for:  CBMZ  Current Medications: Current Outpatient Medications  Medication Sig Dispense Refill  . ARIPiprazole (ABILIFY) 2 MG tablet Take 1 tablet (2 mg total) daily by mouth. 30 tablet 2  . cetirizine (ZYRTEC) 10 MG tablet Take 10 mg by mouth daily.    . clonazePAM (KLONOPIN) 0.5 MG tablet Take 1 tablet (0.5 mg total) by mouth daily as needed for anxiety. 30 tablet 0  . lamoTRIgine (LAMICTAL) 200 MG tablet Take 1 tablet (200 mg total) daily by mouth. 30 tablet 2  . PROAIR HFA 108 (90 Base) MCG/ACT inhaler INHALE 2 PUFFS PO QID PRN  0  . SUBOXONE 8-2 MG FILM   0  . traZODone (DESYREL) 50 MG tablet Take 1 tablet (50 mg total) by mouth at bedtime. 30  tablet 0  . venlafaxine XR (EFFEXOR-XR) 150 MG 24 hr capsule TAKE ONE CAPSULE BY MOUTH EVERY DAY WITH BREAKFAST 30 capsule 2   No current facility-administered medications for this visit.      Musculoskeletal: Strength & Muscle Tone: within normal limits Gait & Station: normal Patient leans: N/A  Psychiatric Specialty Exam: ROS  Blood pressure 128/74, height 5\' 4"  (1.626 m), weight 195 lb 6.4 oz (88.6 kg).Body mass index is 33.54 kg/m.  General Appearance: Casual  Eye Contact:  Good  Speech:  Clear and Coherent  Volume:  Normal  Mood:  Euthymic  Affect:  Congruent  Thought Process:  Goal Directed  Orientation:  Full (Time, Place, and Person)  Thought Content: Logical   Suicidal Thoughts:  No  Homicidal Thoughts:  No  Memory:  Immediate;   Good Recent;   Good Remote;   Good  Judgement:  Good  Insight:  Good  Psychomotor Activity:  Normal  Concentration:  Concentration: Good and Attention Span: Good  Recall:  Good  Fund of Knowledge: Good  Language: Good  Akathisia:  No  Handed:  Right  AIMS (if indicated): not done  Assets:  Communication Skills Desire for Improvement Housing Physical Health Resilience Social Support  ADL's:  Intact  Cognition: WNL  Sleep:  Good   Screenings:   Assessment and Plan: Bipolar disorder, depressed type.  Anxiety disorder NOS.  Discuss her weight gain and encouraged to watch her calorie intake and do regular exercise.  Recommended not to have a late supper or dinner.  She gained more than 15 pounds in past 3 months.  Continue Abilify 2 mg daily, Klonopin 0.5 mg at bedtime, trazodone 50 mg at bedtime Lamictal 200 mg daily and Effexor 150 mg daily.  Patient has no rash, itching, tremors or shakes.  Recommended to call us back if she has any question or any concern.  Follow-up in 3 months.   Cleotis NipperSyed T Johntavious Francom, MD 08/31/2017, 1:56 PM

## 2017-11-28 ENCOUNTER — Ambulatory Visit (HOSPITAL_COMMUNITY): Payer: BLUE CROSS/BLUE SHIELD | Admitting: Psychiatry

## 2018-01-01 ENCOUNTER — Other Ambulatory Visit (HOSPITAL_COMMUNITY): Payer: Self-pay | Admitting: Psychiatry

## 2018-01-01 DIAGNOSIS — F411 Generalized anxiety disorder: Secondary | ICD-10-CM

## 2018-01-01 DIAGNOSIS — F3131 Bipolar disorder, current episode depressed, mild: Secondary | ICD-10-CM

## 2018-01-16 ENCOUNTER — Ambulatory Visit (HOSPITAL_COMMUNITY): Payer: Self-pay | Admitting: Psychiatry

## 2018-01-17 ENCOUNTER — Other Ambulatory Visit (HOSPITAL_COMMUNITY): Payer: Self-pay | Admitting: Psychiatry

## 2018-01-17 DIAGNOSIS — F3131 Bipolar disorder, current episode depressed, mild: Secondary | ICD-10-CM

## 2018-01-17 DIAGNOSIS — F411 Generalized anxiety disorder: Secondary | ICD-10-CM

## 2018-01-19 NOTE — Telephone Encounter (Signed)
Received refill request for Aripiprazole, Clonazepam, Lamotrigine, Trazodone, Venlafaxine. Prescriptions are to sent to [facility]. Next appointment is scheduled for 02-16-18. Please advise

## 2018-01-22 ENCOUNTER — Telehealth (HOSPITAL_COMMUNITY): Payer: Self-pay

## 2018-01-22 ENCOUNTER — Other Ambulatory Visit (HOSPITAL_COMMUNITY): Payer: Self-pay

## 2018-01-22 DIAGNOSIS — F3131 Bipolar disorder, current episode depressed, mild: Secondary | ICD-10-CM

## 2018-01-22 MED ORDER — TRAZODONE HCL 50 MG PO TABS: 50.0000 mg | ORAL_TABLET | Freq: Every day | ORAL | 0 refills | Status: DC

## 2018-01-22 NOTE — Telephone Encounter (Signed)
Received a refill fax request for Clonazepam 0.5mg  tabs. Next appointment is 02-16-18. Please advise

## 2018-01-23 ENCOUNTER — Other Ambulatory Visit (HOSPITAL_COMMUNITY): Payer: Self-pay

## 2018-01-23 DIAGNOSIS — F411 Generalized anxiety disorder: Secondary | ICD-10-CM

## 2018-01-23 DIAGNOSIS — F3131 Bipolar disorder, current episode depressed, mild: Secondary | ICD-10-CM

## 2018-01-23 MED ORDER — VENLAFAXINE HCL ER 150 MG PO CP24
ORAL_CAPSULE | ORAL | 0 refills | Status: DC
Start: 2018-01-23 — End: 2018-02-27

## 2018-01-23 MED ORDER — ARIPIPRAZOLE 2 MG PO TABS: 2.0000 mg | ORAL_TABLET | Freq: Every day | ORAL | 0 refills | Status: DC

## 2018-01-23 MED ORDER — LAMOTRIGINE 200 MG PO TABS: 200.0000 mg | ORAL_TABLET | Freq: Every day | ORAL | 0 refills | Status: DC

## 2018-01-23 MED ORDER — CLONAZEPAM 0.5 MG PO TABS: 0.5000 mg | ORAL_TABLET | Freq: Every day | ORAL | 0 refills | Status: DC | PRN

## 2018-01-23 NOTE — Telephone Encounter (Signed)
Refill phone into pharmacy. [facility]

## 2018-02-08 ENCOUNTER — Other Ambulatory Visit (HOSPITAL_COMMUNITY): Payer: Self-pay | Admitting: Psychiatry

## 2018-02-08 DIAGNOSIS — F3131 Bipolar disorder, current episode depressed, mild: Secondary | ICD-10-CM

## 2018-02-16 ENCOUNTER — Ambulatory Visit (HOSPITAL_COMMUNITY): Payer: BLUE CROSS/BLUE SHIELD | Admitting: Psychiatry

## 2018-02-19 ENCOUNTER — Other Ambulatory Visit (HOSPITAL_COMMUNITY): Payer: Self-pay

## 2018-02-19 DIAGNOSIS — F3131 Bipolar disorder, current episode depressed, mild: Secondary | ICD-10-CM

## 2018-02-19 MED ORDER — TRAZODONE HCL 50 MG PO TABS: 50.0000 mg | ORAL_TABLET | Freq: Every day | ORAL | 0 refills | Status: DC

## 2018-02-27 ENCOUNTER — Other Ambulatory Visit (HOSPITAL_COMMUNITY): Payer: Self-pay

## 2018-02-27 DIAGNOSIS — F3131 Bipolar disorder, current episode depressed, mild: Secondary | ICD-10-CM

## 2018-02-27 MED ORDER — LAMOTRIGINE 200 MG PO TABS: 200.0000 mg | ORAL_TABLET | Freq: Every day | ORAL | 0 refills | Status: DC

## 2018-02-27 MED ORDER — ARIPIPRAZOLE 2 MG PO TABS: 2.0000 mg | ORAL_TABLET | Freq: Every day | ORAL | 0 refills | Status: DC

## 2018-02-27 MED ORDER — VENLAFAXINE HCL ER 150 MG PO CP24
ORAL_CAPSULE | ORAL | 0 refills | Status: DC
Start: 2018-02-27 — End: 2018-03-11

## 2018-02-28 ENCOUNTER — Other Ambulatory Visit (HOSPITAL_COMMUNITY): Payer: Self-pay

## 2018-02-28 DIAGNOSIS — F3131 Bipolar disorder, current episode depressed, mild: Secondary | ICD-10-CM

## 2018-02-28 DIAGNOSIS — F411 Generalized anxiety disorder: Secondary | ICD-10-CM

## 2018-02-28 MED ORDER — CLONAZEPAM 0.5 MG PO TABS: 0.5000 mg | ORAL_TABLET | Freq: Every day | ORAL | 0 refills | Status: DC | PRN

## 2018-03-11 ENCOUNTER — Other Ambulatory Visit (HOSPITAL_COMMUNITY): Payer: Self-pay | Admitting: Psychiatry

## 2018-03-11 DIAGNOSIS — F3131 Bipolar disorder, current episode depressed, mild: Secondary | ICD-10-CM

## 2018-03-27 ENCOUNTER — Other Ambulatory Visit (HOSPITAL_COMMUNITY): Payer: Self-pay | Admitting: Psychiatry

## 2018-03-27 ENCOUNTER — Encounter (HOSPITAL_COMMUNITY): Payer: Self-pay | Admitting: Psychiatry

## 2018-03-27 ENCOUNTER — Ambulatory Visit (INDEPENDENT_AMBULATORY_CARE_PROVIDER_SITE_OTHER): Payer: BLUE CROSS/BLUE SHIELD | Admitting: Psychiatry

## 2018-03-27 VITALS — BP 122/70 | HR 80 | Ht 64.0 in | Wt 192.0 lb

## 2018-03-27 DIAGNOSIS — F3131 Bipolar disorder, current episode depressed, mild: Secondary | ICD-10-CM

## 2018-03-27 DIAGNOSIS — F411 Generalized anxiety disorder: Secondary | ICD-10-CM

## 2018-03-27 DIAGNOSIS — F17210 Nicotine dependence, cigarettes, uncomplicated: Secondary | ICD-10-CM

## 2018-03-27 DIAGNOSIS — Z811 Family history of alcohol abuse and dependence: Secondary | ICD-10-CM

## 2018-03-27 DIAGNOSIS — Z79899 Other long term (current) drug therapy: Secondary | ICD-10-CM

## 2018-03-27 DIAGNOSIS — Z818 Family history of other mental and behavioral disorders: Secondary | ICD-10-CM | POA: Diagnosis not present

## 2018-03-27 DIAGNOSIS — Z813 Family history of other psychoactive substance abuse and dependence: Secondary | ICD-10-CM

## 2018-03-27 MED ORDER — ARIPIPRAZOLE (SENSOR) 5 MG PO TABS: 5.0000 mg | ORAL_TABLET | Freq: Every day | ORAL | 0 refills | Status: DC

## 2018-03-27 MED ORDER — VENLAFAXINE HCL ER 150 MG PO CP24: 150.0000 mg | ORAL_CAPSULE | Freq: Every day | ORAL | 0 refills | Status: DC

## 2018-03-27 MED ORDER — CLONAZEPAM 0.5 MG PO TABS: 0.5000 mg | ORAL_TABLET | Freq: Every day | ORAL | 0 refills | Status: DC | PRN

## 2018-03-27 MED ORDER — TRAZODONE HCL 50 MG PO TABS: 75.0000 mg | ORAL_TABLET | Freq: Every day | ORAL | 0 refills | Status: DC

## 2018-03-27 NOTE — Progress Notes (Addendum)
BH MD/PA/NP OP Progress Note  03/27/2018 10:54 AM Tammy Wilkinson  MRN:  [id number]  Chief Complaint: Patient returns for medication available appointment HPI: I am covering for Dr. Lolly Mustache who is currently out of office.  Patient understands she will continue to follow-up with Dr. Lolly Mustache. 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  History of mood disorder/has been diagnosed with bipolar disorder in the past. States she has had some increased mood instability recently-describes as brief mood swings rather than any sustained depression or hypomania.  Reports her sleep has not been as good as before and describes seeing "only a few hours"- attributes increased symptoms in part to recent death of a friend. A controlling factor may be that she had recently run out of some of her medications-they were reviewed on September 5.  She normally gets them via mail but they have not arrived yet.  The result is that she has been off of Effexor XR and Klonopin for 3 days or so.  She has continued to take Lamictal as she has not run out of said medication. She states that medications have been well tolerated and overall has been effective.  Denies side effects. Denies any misuse or abuse of Klonopin.   Of note, patient was on Suboxone management for remote history of opiate abuse, prescribed by another provider.  States she has been off Suboxone x2 months now and states she has had no increased cravings or thoughts of opiate use and remain sober At this time she denies any suicidal ideations, is future oriented, looking forward to starting a new job, and expressing a sense of responsibility/ability towards her children.  Visit Diagnosis:    ICD-10-CM   1. Bipolar affective disorder, currently depressed, mild (HCC) F31.31 venlafaxine XR (EFFEXOR-XR) 150 MG 24 hr capsule    traZODone (DESYREL) 50 MG tablet    clonazePAM (KLONOPIN) 0.5 MG tablet     ARIPiprazole 5 MG TABS  2. Generalized anxiety disorder F41.1 clonazePAM (KLONOPIN) 0.5 MG tablet    Past Psychiatric History:   Past Medical History:  Past Medical History:  Diagnosis Date  . ADHD (attention deficit hyperactivity disorder)   . Allergy   . Anemia   . Anxiety   . Asthma    pt states when she was a child  . Concussion 12/30/14  . Depression   . Headache(784.0)    Hx; of migraines  . Heartburn    hx:  . Pneumonia   . Shortness of breath     Past Surgical History:  Procedure Laterality Date  . LARYNGOPLASTY Left 03/29/2013   Procedure: VOCAL CORD MEDIALIZATION;  Surgeon: Serena Colonel, MD;  Location: [facility] OR;  Service: ENT;  Laterality: Left;  with placement of silastic block  . WISDOM TOOTH EXTRACTION      Family Psychiatric History:   Family History:  Family History  Problem Relation Age of Onset  . Hypertension Mother   . Anxiety disorder Mother   . Depression Mother   . Physical abuse Mother   . Hypertension Father   . Drug abuse Sister   . Anxiety disorder Sister   . Depression Sister   . Bipolar disorder Sister   . Sexual abuse Sister   . Alcohol abuse Brother   . Drug abuse Brother   . Depression Brother   . Anxiety disorder Brother   . ADD / ADHD Other   . ADD / ADHD Other   . Schizophrenia  Sister   . Sexual abuse Sister   . ADD / ADHD Sister   . Depression Sister   . Sexual abuse Sister   . Drug abuse Sister   . Anxiety disorder Sister   . Depression Sister   . Drug abuse Sister   . Anesthesia problems Neg Hx     Social History:  Social History   Socioeconomic History  . Marital status: Married    Spouse name: Not on file  . Number of children: Not on file  . Years of education: Not on file  . Highest education level: Not on file  Occupational History  . Not on file  Social Needs  . Financial resource strain: Not on file  . Food insecurity:    Worry: Not on file    Inability: Not on file  . Transportation needs:     Medical: Not on file    Non-medical: Not on file  Tobacco Use  . Smoking status: Current Every Day Smoker    Packs/day: 0.50    Types: Cigarettes    Last attempt to quit: 08/25/2013    Years since quitting: 4.5  . Smokeless tobacco: Never Used  Substance and Sexual Activity  . Alcohol use: Yes    Alcohol/week: 2.0 standard drinks    Types: 2 Cans of beer per week    Comment: rare  . Drug use: No  . Sexual activity: Yes    Birth control/protection: IUD  Lifestyle  . Physical activity:    Days per week: Not on file    Minutes per session: Not on file  . Stress: Not on file  Relationships  . Social connections:    Talks on phone: Not on file    Gets together: Not on file    Attends religious service: Not on file    Active member of club or organization: Not on file    Attends meetings of clubs or organizations: Not on file    Relationship status: Not on file  Other Topics Concern  . Not on file  Social History Narrative  . Not on file    Allergies:  Allergies  Allergen Reactions  . Latex Hives  . Amoxil [Amoxicillin] Other (See Comments)    Childhood reaction hallucinations  . Penicillins Other (See Comments)    Childhood reaction hallucinations    Metabolic Disorder Labs: Lab Results  Component Value Date   HGBA1C 5.1 03/26/2015   MPG 100 03/26/2015   No results found for: PROLACTIN No results found for: CHOL, TRIG, HDL, CHOLHDL, VLDL, LDLCALC Lab Results  Component Value Date   TSH 1.396 03/26/2015   TSH 1.273 03/01/2012    Therapeutic Level Labs: No results found for: LITHIUM No results found for: VALPROATE No components found for:  CBMZ  Current Medications: Current Outpatient Medications  Medication Sig Dispense Refill  . ARIPiprazole 5 MG TABS Take 5 mg by mouth daily. One tablet PO QDAY 30 tablet 0  . cetirizine (ZYRTEC) 10 MG tablet Take 10 mg by mouth daily.    . clonazePAM (KLONOPIN) 0.5 MG tablet Take 1 tablet (0.5 mg total) by mouth daily  as needed for anxiety. 30 tablet 0  . lamoTRIgine (LAMICTAL) 200 MG tablet Take 1 tablet (200 mg total) by mouth daily. 30 tablet 0  . PROAIR HFA 108 (90 Base) MCG/ACT inhaler INHALE 2 PUFFS PO QID PRN  0  . traZODone (DESYREL) 50 MG tablet Take 1.5 tablets (75 mg total) by mouth at bedtime.  45 tablet 0  . venlafaxine XR (EFFEXOR-XR) 150 MG 24 hr capsule Take 1 capsule (150 mg total) by mouth daily with breakfast. 30 capsule 0   No current facility-administered medications for this visit.      Musculoskeletal: Strength & Muscle Tone: within normal limits Gait & Station: normal Patient leans: N/A  Psychiatric Specialty Exam: ROS no chest pain, no shortness of breath, no vomiting, no rash, denies any excessive sedation from her medication regimen  Blood pressure 122/70, pulse 80, height 5\' 4"  (1.626 m), weight 87.1 kg.Body mass index is 32.96 kg/m.  General Appearance: Well Groomed  Eye Contact:  Good  Speech:  Normal Rate  Volume:  Normal  Mood:  Reports some increased anxiety  Affect:  Appropriate, reactive, at this time not irritable or expansive  Thought Process:  Linear and Descriptions of Associations: Intact  Orientation:  Full (Time, Place, and Person)  Thought Content: No hallucinations, no delusions, not internally preoccupied   Suicidal Thoughts:  No denies suicidal or self-injurious ideations homicidal or violent ideations  Homicidal Thoughts:  No  Memory:  Recent and remote grossly intact  Judgement:  Other:  Presents  Insight:  Present  Psychomotor Activity:  Normal-no current psychomotor agitation or restlessness noted  Concentration:  Concentration: Good and Attention Span: Good  Recall:  Good  Fund of Knowledge: Good  Language: Good  Akathisia:  Negative  Handed:  Right  AIMS (if indicated): AIMS not done today, does not endorse abnormal movements and none noted  Assets:  Communication Skills Desire for Improvement Resilience Others:  Invested in her family   ADL's:  Intact  Cognition: WNL  Sleep:  Fair   Screenings:   Assessment and Plan: 32 year old female, history of bipolar spectrum disorder and of anxiety.  Returns for medication management appointment.  Describes recent increased mood/affective lability and decreased quality of sleep recently.  Currently presents slightly anxious but with a stable mood and fully reactive affect.  No suicidal ideations and is future oriented.  Attributes recent increased symptoms to recent death of a friend, and to being off Effexor XR and Klonopin over the last 2 to 3 days as her mail in prescription has not arrived yet. States that overall medications have been effective, but feels that medication dosages may need to be adjusted further.  We discussed options and agrees to titrate Abilify from 2 mg daily to 5 mg daily to address mood disorder and trazodone from 50 mg at night to 75 mg at night to address insomnia. We will also provide printed prescriptions for Effexor XR 150 mg daily, Klonopin 0.5 mg nightly . Side effects reviewed, patient aware of abuse potential associated with benzodiazepine. Will return in 1 month, agrees to contact me sooner should to be any worsening or concerns prior We will also order routine HgbA1C, Lipid Panel, as on Aripirpazole management.  Craige Cotta, MD 03/27/2018, 10:54 AM

## 2018-03-28 LAB — LIPID PANEL WITH LDL/HDL RATIO
Cholesterol, Total: 167 mg/dL (ref 100–199)
HDL: 40 mg/dL (ref >39)
LDL CALC: 49 mg/dL (ref 0–99)
LDL/HDL RATIO: 1.2 ratio (ref 0.0–3.2)
Triglycerides: 389 mg/dL — ABNORMAL HIGH (ref 0–149)
VLDL CHOLESTEROL CAL: 78 mg/dL — AB (ref 5–40)

## 2018-03-28 LAB — HEMOGLOBIN A1C
ESTIMATED AVERAGE GLUCOSE: 91 mg/dL
HEMOGLOBIN A1C: 4.8 % (ref 4.8–5.6)

## 2018-04-04 ENCOUNTER — Other Ambulatory Visit (HOSPITAL_COMMUNITY): Payer: Self-pay

## 2018-04-04 DIAGNOSIS — F3131 Bipolar disorder, current episode depressed, mild: Secondary | ICD-10-CM

## 2018-04-04 MED ORDER — LAMOTRIGINE 200 MG PO TABS: 200.0000 mg | ORAL_TABLET | Freq: Every day | ORAL | 0 refills | Status: DC

## 2018-04-24 ENCOUNTER — Ambulatory Visit (INDEPENDENT_AMBULATORY_CARE_PROVIDER_SITE_OTHER): Payer: BLUE CROSS/BLUE SHIELD | Admitting: Psychiatry

## 2018-04-24 ENCOUNTER — Encounter (HOSPITAL_COMMUNITY): Payer: Self-pay | Admitting: Psychiatry

## 2018-04-24 DIAGNOSIS — F411 Generalized anxiety disorder: Secondary | ICD-10-CM | POA: Diagnosis not present

## 2018-04-24 DIAGNOSIS — F3131 Bipolar disorder, current episode depressed, mild: Secondary | ICD-10-CM | POA: Diagnosis not present

## 2018-04-24 MED ORDER — ARIPIPRAZOLE 10 MG PO TABS: 10.0000 mg | ORAL_TABLET | Freq: Every day | ORAL | 0 refills | Status: DC

## 2018-04-24 MED ORDER — LAMOTRIGINE 200 MG PO TABS: 200.0000 mg | ORAL_TABLET | Freq: Every day | ORAL | 0 refills | Status: DC

## 2018-04-24 MED ORDER — CLONAZEPAM 0.5 MG PO TABS: 0.5000 mg | ORAL_TABLET | Freq: Two times a day (BID) | ORAL | 0 refills | Status: DC | PRN

## 2018-04-24 MED ORDER — TRAZODONE HCL 50 MG PO TABS: 75.0000 mg | ORAL_TABLET | Freq: Every day | ORAL | 0 refills | Status: DC

## 2018-04-24 MED ORDER — VENLAFAXINE HCL ER 150 MG PO CP24: 150.0000 mg | ORAL_CAPSULE | Freq: Every day | ORAL | 0 refills | Status: DC

## 2018-04-24 NOTE — Progress Notes (Addendum)
BH MD/PA/NP OP Progress Note  04/24/2018 1:30 PM Tammy Wilkinson  MRN:  [id number]  Chief Complaint:  Patient returns for medication management appt  HPI:  I am covering for Dr. Lolly Mustache, who is out of office, patient will continue to follow up with Dr. Lolly Mustache. 32 year old married female, two children, currently not employed . History of Bipolar Disorder and GAD diagnosis. Patient reports partial improvement compared to prior visit . She states she does continue to have frequent mood swings and a subjective sense of mood instability, but no sustained episodes of mania or hypomania. Reports chronic anxiety symptoms, mainly tendency to worry, which have improved partially with medication management. Denies medication side effects. She is on Effexor XR, Trazodone, Abilify. She is on no new medications- newest medication regimen is Abilify, which she has been on for a few months. States she feels Abilify has been well tolerated and partially helpful.  Denies suicidal ideations. Of note, patient had been on Suboxone management for history of Opiate Use Disorder ( not prescribed at this clinic). She is now off Suboxone x 4 months, and reports she has remained  sober, abstinent . Labs reviewed- HgbA1C 4.8 , Lipid panel remarkable for hypertriglyceridemia- reviewed this with patient , states she had been told triglycerides were increased by her PCP. Will follow up with PCP for monitoring/ management.    Visit Diagnosis:    ICD-10-CM   1. Bipolar affective disorder, currently depressed, mild (HCC) F31.31 clonazePAM (KLONOPIN) 0.5 MG tablet    lamoTRIgine (LAMICTAL) 200 MG tablet    traZODone (DESYREL) 50 MG tablet    venlafaxine XR (EFFEXOR-XR) 150 MG 24 hr capsule  2. Generalized anxiety disorder F41.1 clonazePAM (KLONOPIN) 0.5 MG tablet    Past Psychiatric History:   Past Medical History:  Past Medical History:  Diagnosis Date  . ADHD (attention deficit hyperactivity disorder)   . Allergy   .  Anemia   . Anxiety   . Asthma    pt states when she was a child  . Concussion 12/30/14  . Depression   . Headache(784.0)    Hx; of migraines  . Heartburn    hx:  . Pneumonia   . Shortness of breath     Past Surgical History:  Procedure Laterality Date  . LARYNGOPLASTY Left 03/29/2013   Procedure: VOCAL CORD MEDIALIZATION;  Surgeon: Serena Colonel, MD;  Location: [facility] OR;  Service: ENT;  Laterality: Left;  with placement of silastic block  . WISDOM TOOTH EXTRACTION      Family Psychiatric History:  Family History:  Family History  Problem Relation Age of Onset  . Hypertension Mother   . Anxiety disorder Mother   . Depression Mother   . Physical abuse Mother   . Hypertension Father   . Drug abuse Sister   . Anxiety disorder Sister   . Depression Sister   . Bipolar disorder Sister   . Sexual abuse Sister   . Alcohol abuse Brother   . Drug abuse Brother   . Depression Brother   . Anxiety disorder Brother   . ADD / ADHD Other   . ADD / ADHD Other   . Schizophrenia Sister   . Sexual abuse Sister   . ADD / ADHD Sister   . Depression Sister   . Sexual abuse Sister   . Drug abuse Sister   . Anxiety disorder Sister   . Depression Sister   . Drug abuse Sister   . Anesthesia problems Neg Hx  Social History:  Social History   Socioeconomic History  . Marital status: Married    Spouse name: Not on file  . Number of children: Not on file  . Years of education: Not on file  . Highest education level: Not on file  Occupational History  . Not on file  Social Needs  . Financial resource strain: Not on file  . Food insecurity:    Worry: Not on file    Inability: Not on file  . Transportation needs:    Medical: Not on file    Non-medical: Not on file  Tobacco Use  . Smoking status: Current Every Day Smoker    Packs/day: 0.50    Types: Cigarettes    Last attempt to quit: 08/25/2013    Years since quitting: 4.6  . Smokeless tobacco: Never Used  Substance and Sexual  Activity  . Alcohol use: Yes    Alcohol/week: 2.0 standard drinks    Types: 2 Cans of beer per week    Comment: rare  . Drug use: No  . Sexual activity: Yes    Birth control/protection: IUD  Lifestyle  . Physical activity:    Days per week: Not on file    Minutes per session: Not on file  . Stress: Not on file  Relationships  . Social connections:    Talks on phone: Not on file    Gets together: Not on file    Attends religious service: Not on file    Active member of club or organization: Not on file    Attends meetings of clubs or organizations: Not on file    Relationship status: Not on file  Other Topics Concern  . Not on file  Social History Narrative  . Not on file    Allergies:  Allergies  Allergen Reactions  . Latex Hives  . Amoxil [Amoxicillin] Other (See Comments)    Childhood reaction hallucinations  . Penicillins Other (See Comments)    Childhood reaction hallucinations    Metabolic Disorder Labs: Lab Results  Component Value Date   HGBA1C 4.8 03/27/2018   MPG 100 03/26/2015   No results found for: PROLACTIN Lab Results  Component Value Date   CHOL 167 03/27/2018   TRIG 389 (H) 03/27/2018   HDL 40 03/27/2018   LDLCALC 49 03/27/2018   Lab Results  Component Value Date   TSH 1.396 03/26/2015   TSH 1.273 03/01/2012    Therapeutic Level Labs: No results found for: LITHIUM No results found for: VALPROATE No components found for:  CBMZ  Current Medications: Current Outpatient Medications  Medication Sig Dispense Refill  . ARIPiprazole (ABILIFY) 10 MG tablet Take 1 tablet (10 mg total) by mouth daily. 30 tablet 0  . cetirizine (ZYRTEC) 10 MG tablet Take 10 mg by mouth daily.    . clonazePAM (KLONOPIN) 0.5 MG tablet Take 1 tablet (0.5 mg total) by mouth 2 (two) times daily as needed for anxiety. 60 tablet 0  . lamoTRIgine (LAMICTAL) 200 MG tablet Take 1 tablet (200 mg total) by mouth daily. 30 tablet 0  . PROAIR HFA 108 (90 Base) MCG/ACT  inhaler INHALE 2 PUFFS PO QID PRN  0  . traZODone (DESYREL) 50 MG tablet Take 1.5 tablets (75 mg total) by mouth at bedtime. 45 tablet 0  . venlafaxine XR (EFFEXOR-XR) 150 MG 24 hr capsule Take 1 capsule (150 mg total) by mouth daily with breakfast. 30 capsule 0   No current facility-administered medications for this visit.  Musculoskeletal: Strength & Muscle Tone: within normal limits Gait & Station: normal Patient leans: N/A  Psychiatric Specialty Exam: ROS no chest pain, no dyspnea, no vomiting  Blood pressure 120/70, height 5\' 4"  (1.626 m), weight 195 lb (88.5 kg).Body mass index is 33.47 kg/m.  General Appearance: Well Groomed  Eye Contact:  Good  Speech:  Normal Rate  Volume:  Normal  Mood:  partial improvement   Affect:  appropriate, reactive, vaguely anxious  Thought Process:  Linear and Descriptions of Associations: Intact  Orientation:  Full (Time, Place, and Person)  Thought Content: no hallucinations, no delusions, not internally preoccupied    Suicidal Thoughts:  No denies suicidal or self injurious ideations, denies homicidal or violent ideations  Homicidal Thoughts:  No  Memory:  recent and remote grossly intact   Judgement:  Other:  present  Insight:  Present  Psychomotor Activity:  Normal  Concentration:  Concentration: Good and Attention Span: Good  Recall:  Good  Fund of Knowledge: Good  Language: Good  Akathisia:  Negative  Handed:  Right  AIMS (if indicated): no abnormal movements noted or reported   Assets:  Desire for Improvement Resilience Others:  sense of responisbility to family  ADL's:  Intact  Cognition: WNL  Sleep:  Good   Screenings:   Assessment and Plan: 32 year old female, history of bipolar spectrum disorder and of anxiety.  Returns for medication management appointment. Reports partially improved mood , feeling partially better, but continues to report frequent short lived mood fluctuations and a subjective sense of mood  instability. She does not endorse any sustained symptoms of hypomania or mania. No SI, no psychotic symptoms, functioning well in daily activities. Denies medication side effects and states meds have been partially helpful- has tolerated Abilify titration well thus far. We discussed treatment options. Agrees to titrate Abilify further. Increase Abilify to 10 mgrs QDAY  Continue Klonopin 0.5 mgrs BID PRN- patient aware of sedation and abuse potential, denies misusing  Continue Lamictal 200 mgrs QDAY - reviewed side effects , potential risk of severe rash, S.J . Syndrome Continue Trazodone 75 mgrs QHS Continue Effexor XR 150 mgrs QDAY  Encouraged patient to consider starting individual psychotherapy in addition to medication management.  Will see patient in 4-6 weeks, agrees to contact clinic sooner if any worsening prior   Craige Cotta, MD 04/24/2018, 1:30 PM

## 2018-04-25 ENCOUNTER — Other Ambulatory Visit (HOSPITAL_COMMUNITY): Payer: Self-pay

## 2018-04-25 DIAGNOSIS — F411 Generalized anxiety disorder: Secondary | ICD-10-CM

## 2018-04-25 DIAGNOSIS — F3131 Bipolar disorder, current episode depressed, mild: Secondary | ICD-10-CM

## 2018-05-15 ENCOUNTER — Other Ambulatory Visit (HOSPITAL_COMMUNITY): Payer: Self-pay

## 2018-05-15 DIAGNOSIS — F3131 Bipolar disorder, current episode depressed, mild: Secondary | ICD-10-CM

## 2018-05-15 MED ORDER — VENLAFAXINE HCL ER 150 MG PO CP24: 150.0000 mg | ORAL_CAPSULE | Freq: Every day | ORAL | 2 refills | Status: DC

## 2018-05-15 MED ORDER — TRAZODONE HCL 50 MG PO TABS: 75.0000 mg | ORAL_TABLET | Freq: Every day | ORAL | 2 refills | Status: DC

## 2018-05-15 MED ORDER — LAMOTRIGINE 200 MG PO TABS: 200.0000 mg | ORAL_TABLET | Freq: Every day | ORAL | 2 refills | Status: DC

## 2018-05-15 NOTE — Progress Notes (Signed)
Sent in 30 days with a refill until appointment on 07-26-18, per Regan

## 2018-05-23 ENCOUNTER — Other Ambulatory Visit (HOSPITAL_COMMUNITY): Payer: Self-pay | Admitting: Psychiatry

## 2018-05-23 DIAGNOSIS — F411 Generalized anxiety disorder: Secondary | ICD-10-CM

## 2018-05-23 DIAGNOSIS — F3131 Bipolar disorder, current episode depressed, mild: Secondary | ICD-10-CM

## 2018-05-29 ENCOUNTER — Other Ambulatory Visit (HOSPITAL_COMMUNITY): Payer: Self-pay

## 2018-05-29 DIAGNOSIS — F411 Generalized anxiety disorder: Secondary | ICD-10-CM

## 2018-05-29 DIAGNOSIS — F3131 Bipolar disorder, current episode depressed, mild: Secondary | ICD-10-CM

## 2018-05-29 MED ORDER — CLONAZEPAM 0.5 MG PO TABS: 0.5000 mg | ORAL_TABLET | Freq: Two times a day (BID) | ORAL | 0 refills | Status: DC | PRN

## 2018-05-29 MED ORDER — ARIPIPRAZOLE 10 MG PO TABS: 10.0000 mg | ORAL_TABLET | Freq: Every day | ORAL | 0 refills | Status: DC

## 2018-05-31 ENCOUNTER — Other Ambulatory Visit (HOSPITAL_COMMUNITY): Payer: Self-pay

## 2018-05-31 ENCOUNTER — Other Ambulatory Visit (HOSPITAL_COMMUNITY): Payer: Self-pay | Admitting: Psychiatry

## 2018-05-31 DIAGNOSIS — F3131 Bipolar disorder, current episode depressed, mild: Secondary | ICD-10-CM

## 2018-05-31 MED ORDER — VENLAFAXINE HCL ER 150 MG PO CP24: 150.0000 mg | ORAL_CAPSULE | Freq: Every day | ORAL | 2 refills | Status: DC

## 2018-06-01 ENCOUNTER — Other Ambulatory Visit (HOSPITAL_COMMUNITY): Payer: Self-pay | Admitting: Psychiatry

## 2018-06-01 DIAGNOSIS — F3131 Bipolar disorder, current episode depressed, mild: Secondary | ICD-10-CM
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
## 2018-06-05 ENCOUNTER — Other Ambulatory Visit (HOSPITAL_COMMUNITY): Payer: Self-pay | Admitting: Psychiatry

## 2018-06-05 DIAGNOSIS — F3131 Bipolar disorder, current episode depressed, mild: Secondary | ICD-10-CM

## 2018-06-07 ENCOUNTER — Other Ambulatory Visit (HOSPITAL_COMMUNITY): Payer: Self-pay

## 2018-06-07 DIAGNOSIS — F3131 Bipolar disorder, current episode depressed, mild: Secondary | ICD-10-CM

## 2018-06-07 MED ORDER — TRAZODONE HCL 50 MG PO TABS: 75.0000 mg | ORAL_TABLET | Freq: Every day | ORAL | 2 refills | Status: DC

## 2018-06-07 MED ORDER — LAMOTRIGINE 200 MG PO TABS: 200.0000 mg | ORAL_TABLET | Freq: Every day | ORAL | 2 refills | Status: DC

## 2018-06-26 ENCOUNTER — Other Ambulatory Visit (HOSPITAL_COMMUNITY): Payer: Self-pay | Admitting: Psychiatry

## 2018-06-26 DIAGNOSIS — F411 Generalized anxiety disorder: Secondary | ICD-10-CM

## 2018-06-26 DIAGNOSIS — F3131 Bipolar disorder, current episode depressed, mild: Secondary | ICD-10-CM

## 2018-06-29 ENCOUNTER — Telehealth (HOSPITAL_COMMUNITY): Payer: Self-pay

## 2018-06-29 DIAGNOSIS — F3131 Bipolar disorder, current episode depressed, mild: Secondary | ICD-10-CM

## 2018-06-29 DIAGNOSIS — F411 Generalized anxiety disorder: Secondary | ICD-10-CM

## 2018-06-29 NOTE — Telephone Encounter (Signed)
Patient is calling for a refill on Klonopin and Abilify. She was here in October to see Dr. Jama Flavorsobos and has a follow up with you next month. Okay to refill?

## 2018-07-02 MED ORDER — ARIPIPRAZOLE 10 MG PO TABS: 10.0000 mg | ORAL_TABLET | Freq: Every day | ORAL | 0 refills | Status: DC

## 2018-07-02 MED ORDER — CLONAZEPAM 0.5 MG PO TABS: 0.5000 mg | ORAL_TABLET | Freq: Two times a day (BID) | ORAL | 0 refills | Status: DC | PRN

## 2018-07-02 NOTE — Telephone Encounter (Signed)
Refill done.  

## 2018-07-26 ENCOUNTER — Ambulatory Visit (HOSPITAL_COMMUNITY): Payer: BLUE CROSS/BLUE SHIELD | Admitting: Psychiatry

## 2018-07-30 ENCOUNTER — Other Ambulatory Visit (HOSPITAL_COMMUNITY): Payer: Self-pay | Admitting: Psychiatry

## 2018-07-30 DIAGNOSIS — F3131 Bipolar disorder, current episode depressed, mild: Secondary | ICD-10-CM

## 2018-07-30 DIAGNOSIS — F411 Generalized anxiety disorder: Secondary | ICD-10-CM

## 2018-08-06 ENCOUNTER — Other Ambulatory Visit (HOSPITAL_COMMUNITY): Payer: Self-pay

## 2018-08-06 DIAGNOSIS — F3131 Bipolar disorder, current episode depressed, mild: Secondary | ICD-10-CM

## 2018-08-07 ENCOUNTER — Other Ambulatory Visit (HOSPITAL_COMMUNITY): Payer: Self-pay

## 2018-08-07 DIAGNOSIS — F3131 Bipolar disorder, current episode depressed, mild: Secondary | ICD-10-CM

## 2018-08-07 DIAGNOSIS — F411 Generalized anxiety disorder: Secondary | ICD-10-CM

## 2018-08-07 MED ORDER — ARIPIPRAZOLE 10 MG PO TABS: 10.0000 mg | ORAL_TABLET | Freq: Every day | ORAL | 0 refills | Status: DC

## 2018-08-07 MED ORDER — CLONAZEPAM 0.5 MG PO TABS: 0.5000 mg | ORAL_TABLET | Freq: Two times a day (BID) | ORAL | 0 refills | Status: DC | PRN

## 2018-09-07 ENCOUNTER — Ambulatory Visit (INDEPENDENT_AMBULATORY_CARE_PROVIDER_SITE_OTHER): Payer: Medicaid Other | Admitting: Psychiatry

## 2018-09-07 ENCOUNTER — Encounter (HOSPITAL_COMMUNITY): Payer: Self-pay | Admitting: Psychiatry

## 2018-09-07 DIAGNOSIS — F3131 Bipolar disorder, current episode depressed, mild: Secondary | ICD-10-CM

## 2018-09-07 DIAGNOSIS — F411 Generalized anxiety disorder: Secondary | ICD-10-CM

## 2018-09-07 MED ORDER — VENLAFAXINE HCL ER 150 MG PO CP24: 150.0000 mg | ORAL_CAPSULE | Freq: Every day | ORAL | 2 refills | Status: DC

## 2018-09-07 MED ORDER — CLONAZEPAM 0.5 MG PO TABS: 0.5000 mg | ORAL_TABLET | Freq: Two times a day (BID) | ORAL | 0 refills | Status: DC | PRN

## 2018-09-07 MED ORDER — ARIPIPRAZOLE 5 MG PO TABS: 5.0000 mg | ORAL_TABLET | Freq: Every day | ORAL | 2 refills | Status: DC

## 2018-09-07 MED ORDER — TRAZODONE HCL 100 MG PO TABS: 100.0000 mg | ORAL_TABLET | Freq: Every day | ORAL | 2 refills | Status: DC

## 2018-09-07 MED ORDER — LAMOTRIGINE 200 MG PO TABS: 200.0000 mg | ORAL_TABLET | Freq: Every day | ORAL | 2 refills | Status: DC
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
## 2018-11-12 ENCOUNTER — Other Ambulatory Visit (HOSPITAL_COMMUNITY): Payer: Self-pay

## 2018-11-12 DIAGNOSIS — F3131 Bipolar disorder, current episode depressed, mild: Secondary | ICD-10-CM

## 2018-11-12 DIAGNOSIS — F411 Generalized anxiety disorder: Secondary | ICD-10-CM

## 2018-11-12 MED ORDER — CLONAZEPAM 0.5 MG PO TABS: 0.5000 mg | ORAL_TABLET | Freq: Two times a day (BID) | ORAL | 0 refills | Status: DC | PRN

## 2018-12-06 ENCOUNTER — Ambulatory Visit (INDEPENDENT_AMBULATORY_CARE_PROVIDER_SITE_OTHER): Payer: Medicaid Other | Admitting: Psychiatry

## 2018-12-06 ENCOUNTER — Other Ambulatory Visit: Payer: Self-pay

## 2018-12-06 ENCOUNTER — Encounter (HOSPITAL_COMMUNITY): Payer: Self-pay | Admitting: Psychiatry

## 2018-12-06 DIAGNOSIS — F411 Generalized anxiety disorder: Secondary | ICD-10-CM

## 2018-12-06 DIAGNOSIS — F3131 Bipolar disorder, current episode depressed, mild: Secondary | ICD-10-CM

## 2018-12-06 MED ORDER — CLONAZEPAM 0.5 MG PO TABS: 0.5000 mg | ORAL_TABLET | Freq: Two times a day (BID) | ORAL | 1 refills | Status: DC | PRN

## 2018-12-06 MED ORDER — ARIPIPRAZOLE 5 MG PO TABS: 5.0000 mg | ORAL_TABLET | Freq: Every day | ORAL | 2 refills | Status: DC

## 2018-12-06 MED ORDER — VENLAFAXINE HCL ER 150 MG PO CP24: 150.0000 mg | ORAL_CAPSULE | Freq: Every day | ORAL | 2 refills | Status: DC

## 2018-12-06 MED ORDER — LAMOTRIGINE 200 MG PO TABS: 200.0000 mg | ORAL_TABLET | Freq: Every day | ORAL | 2 refills | Status: DC

## 2018-12-06 MED ORDER — TRAZODONE HCL 100 MG PO TABS: 100.0000 mg | ORAL_TABLET | Freq: Every day | ORAL | 2 refills | Status: DC

## 2018-12-06 NOTE — Progress Notes (Signed)
Virtual Visit via Telephone Note  I connected with Tammy Wilkinson on 12/06/18 at  2:20 PM EDT by telephone and verified that I am speaking with the correct person using two identifiers.   I discussed the limitations, risks, security and privacy concerns of performing an evaluation and management service by telephone and the availability of in person appointments. I also discussed with the patient that there may be a patient responsible charge related to this service. The patient expressed understanding and agreed to proceed.   History of Present Illness: Patient is evaluated by phone session.  Lately she has been sick and taking antibiotic.  She reported her mood is tired due to sickness.  She is living with her mother and her husband is living with his cousin.  She was working at [employer] but due to COVID-19 she lost her job.  Overall she describes her anxiety and mood is stable.  She is taking all her medication and reported no rash itching or any tremors.  Her appetite is fair.  She is getting Suboxone from Dr. Agustin Cree.  Her sleep is okay.  She is compliant with the medication.  She is hoping that COVID to resolve then she can able to get her job back.  She like to continue her current medication.   Past Psychiatric History: Reviewed. Seeing provider since early age. H/O cutting herself, mood swing, impulsive behavior, promiscuity, abusing pain medication and anger issues.  Diagnosed with ADHD, bipolar disorder, borderline personality disorder and anxiety disorder. Seen Dr Ladona Ridgel and at [facility].  Tried Effexor, BuSpar, gabapentin, Vistaril ,Prozac, Cymbalta, Seroquel, trazodone, Zoloft, Lexapro, Klonopin, Abilify, Valium, Adderall, Xanax and Klonopin. No H/O inpatient or any suicidal attempt.   Psychiatric Specialty Exam: Physical Exam  ROS  There were no vitals taken for this visit.There is no height or weight on file to calculate BMI.  General Appearance: NA  Eye Contact:  NA   Speech:  Clear and Coherent  Volume:  Normal  Mood:  tired  Affect:  NA  Thought Process:  Goal Directed  Orientation:  Full (Time, Place, and Person)  Thought Content:  Logical  Suicidal Thoughts:  No  Homicidal Thoughts:  No  Memory:  Immediate;   Good Recent;   Good Remote;   Good  Judgement:  Good  Insight:  Good  Psychomotor Activity:  NA  Concentration:  Concentration: Fair and Attention Span: Fair  Recall:  Good  Fund of Knowledge:  Good  Language:  Good  Akathisia:  No  Handed:  Right  AIMS (if indicated):     Assets:  Communication Skills Desire for Improvement Housing Social Support  ADL's:  Intact  Cognition:  WNL  Sleep:         Assessment and Plan: Bipolar disorder type I.  Generalized anxiety disorder.  Patient is a stable on her Abilify.  We have decreased Abilify from 10 mg to 5 mg and she appears to be tolerating well.  She is sleeping better with the trazodone.  Continue Abilify 5 mg daily, trazodone 100 mg at bedtime, Lamictal 200 mg daily, Effexor 150 mg daily and Klonopin 0.5 mg twice a day as needed for anxiety.  Discussed medication side effects and benefits.  Recommended to call us back if she has any question or any concern.  Follow-up in 3 months.  Follow Up Instructions:    I discussed the assessment and treatment plan with the patient. The patient was provided an opportunity to ask questions and all  were answered. The patient agreed with the plan and demonstrated an understanding of the instructions.   The patient was advised to call back or seek an in-person evaluation if the symptoms worsen or if the condition fails to improve as anticipated.  I provided 20 minutes of non-face-to-face time during this encounter.   Kathlee Nations, MD

## 2019-02-14 ENCOUNTER — Other Ambulatory Visit (HOSPITAL_COMMUNITY): Payer: Self-pay

## 2019-02-14 DIAGNOSIS — F3131 Bipolar disorder, current episode depressed, mild: Secondary | ICD-10-CM

## 2019-02-14 DIAGNOSIS — F411 Generalized anxiety disorder: Secondary | ICD-10-CM

## 2019-02-14 MED ORDER — CLONAZEPAM 0.5 MG PO TABS: 0.5000 mg | ORAL_TABLET | Freq: Two times a day (BID) | ORAL | 0 refills | Status: DC | PRN

## 2019-03-07 ENCOUNTER — Telehealth (HOSPITAL_COMMUNITY): Payer: Self-pay | Admitting: Psychiatry

## 2019-03-07 ENCOUNTER — Other Ambulatory Visit: Payer: Self-pay

## 2019-03-07 ENCOUNTER — Encounter (HOSPITAL_COMMUNITY): Payer: Self-pay | Admitting: Psychiatry

## 2019-03-07 ENCOUNTER — Ambulatory Visit (INDEPENDENT_AMBULATORY_CARE_PROVIDER_SITE_OTHER): Payer: BLUE CROSS/BLUE SHIELD | Admitting: Psychiatry

## 2019-03-07 DIAGNOSIS — F1010 Alcohol abuse, uncomplicated: Secondary | ICD-10-CM | POA: Diagnosis not present

## 2019-03-07 DIAGNOSIS — F3131 Bipolar disorder, current episode depressed, mild: Secondary | ICD-10-CM | POA: Diagnosis not present

## 2019-03-07 DIAGNOSIS — F411 Generalized anxiety disorder: Secondary | ICD-10-CM | POA: Diagnosis not present

## 2019-03-07 MED ORDER — VENLAFAXINE HCL ER 150 MG PO CP24: 150.0000 mg | ORAL_CAPSULE | Freq: Every day | ORAL | 0 refills | Status: DC

## 2019-03-07 MED ORDER — ARIPIPRAZOLE 5 MG PO TABS: 5.0000 mg | ORAL_TABLET | Freq: Every day | ORAL | 0 refills | Status: DC

## 2019-03-07 MED ORDER — HYDROXYZINE PAMOATE 25 MG PO CAPS: 25.0000 mg | ORAL_CAPSULE | Freq: Two times a day (BID) | ORAL | 0 refills | Status: DC | PRN

## 2019-03-07 MED ORDER — TRAZODONE HCL 100 MG PO TABS: 100.0000 mg | ORAL_TABLET | Freq: Every day | ORAL | 0 refills | Status: DC

## 2019-03-07 MED ORDER — NALTREXONE HCL 50 MG PO TABS: 50.0000 mg | ORAL_TABLET | Freq: Every day | ORAL | 0 refills | Status: DC

## 2019-03-07 MED ORDER — LAMOTRIGINE 150 MG PO TABS: 150.0000 mg | ORAL_TABLET | Freq: Two times a day (BID) | ORAL | 0 refills | Status: DC
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
## 2019-03-07 NOTE — Telephone Encounter (Signed)
D:  Dr. Adele Schilder referred pt to CD-IOP.  A:  Placed call to orient pt and provide her with a start date.  Pt will have CCA done on 03-12-19 @ 10 a.m with Brandon Melnick, LCAS; and start CD-IOP on 03-13-19 @ 1pm.  Informed Dr. Adele Schilder.  R:  Pt receptive.

## 2019-03-12 ENCOUNTER — Other Ambulatory Visit: Payer: Self-pay

## 2019-03-12 ENCOUNTER — Ambulatory Visit (INDEPENDENT_AMBULATORY_CARE_PROVIDER_SITE_OTHER): Payer: BLUE CROSS/BLUE SHIELD | Admitting: Psychology

## 2019-03-12 ENCOUNTER — Encounter (HOSPITAL_COMMUNITY): Payer: Self-pay | Admitting: Psychology

## 2019-03-12 DIAGNOSIS — F1020 Alcohol dependence, uncomplicated: Secondary | ICD-10-CM
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
## 2019-03-18 ENCOUNTER — Other Ambulatory Visit (HOSPITAL_COMMUNITY): Payer: Self-pay | Admitting: Medical

## 2019-03-18 ENCOUNTER — Encounter (HOSPITAL_COMMUNITY): Payer: Self-pay | Admitting: Medical

## 2019-03-18 ENCOUNTER — Encounter (HOSPITAL_COMMUNITY): Payer: Self-pay | Admitting: Psychology

## 2019-03-18 ENCOUNTER — Other Ambulatory Visit: Payer: Self-pay

## 2019-03-18 ENCOUNTER — Other Ambulatory Visit (HOSPITAL_COMMUNITY): Payer: BLUE CROSS/BLUE SHIELD | Attending: Psychiatry | Admitting: Medical

## 2019-03-18 VITALS — BP 128/76 | HR 84 | Temp 98.3°F | Ht 64.0 in | Wt 209.0 lb

## 2019-03-18 DIAGNOSIS — Z813 Family history of other psychoactive substance abuse and dependence: Secondary | ICD-10-CM | POA: Insufficient documentation

## 2019-03-18 DIAGNOSIS — F10930 Alcohol use, unspecified with withdrawal, uncomplicated: Secondary | ICD-10-CM

## 2019-03-18 DIAGNOSIS — F1220 Cannabis dependence, uncomplicated: Secondary | ICD-10-CM | POA: Diagnosis not present

## 2019-03-18 DIAGNOSIS — F10230 Alcohol dependence with withdrawal, uncomplicated: Secondary | ICD-10-CM | POA: Diagnosis not present

## 2019-03-18 DIAGNOSIS — Z818 Family history of other mental and behavioral disorders: Secondary | ICD-10-CM | POA: Insufficient documentation

## 2019-03-18 DIAGNOSIS — Z91040 Latex allergy status: Secondary | ICD-10-CM | POA: Diagnosis not present

## 2019-03-18 DIAGNOSIS — F419 Anxiety disorder, unspecified: Secondary | ICD-10-CM | POA: Diagnosis not present

## 2019-03-18 DIAGNOSIS — F17210 Nicotine dependence, cigarettes, uncomplicated: Secondary | ICD-10-CM | POA: Diagnosis not present

## 2019-03-18 DIAGNOSIS — F1020 Alcohol dependence, uncomplicated: Secondary | ICD-10-CM

## 2019-03-18 DIAGNOSIS — F1420 Cocaine dependence, uncomplicated: Secondary | ICD-10-CM | POA: Diagnosis not present

## 2019-03-18 DIAGNOSIS — T7422XS Child sexual abuse, confirmed, sequela: Secondary | ICD-10-CM

## 2019-03-18 DIAGNOSIS — Z885 Allergy status to narcotic agent status: Secondary | ICD-10-CM | POA: Insufficient documentation

## 2019-03-18 DIAGNOSIS — Z6372 Alcoholism and drug addiction in family: Secondary | ICD-10-CM | POA: Diagnosis not present

## 2019-03-18 DIAGNOSIS — F3131 Bipolar disorder, current episode depressed, mild: Secondary | ICD-10-CM | POA: Diagnosis not present

## 2019-03-18 DIAGNOSIS — Z8249 Family history of ischemic heart disease and other diseases of the circulatory system: Secondary | ICD-10-CM | POA: Insufficient documentation

## 2019-03-18 DIAGNOSIS — Z880 Allergy status to penicillin: Secondary | ICD-10-CM | POA: Insufficient documentation

## 2019-03-18 DIAGNOSIS — T7492XS Unspecified child maltreatment, confirmed, sequela: Secondary | ICD-10-CM

## 2019-03-18 DIAGNOSIS — Z79899 Other long term (current) drug therapy: Secondary | ICD-10-CM | POA: Insufficient documentation

## 2019-03-18 DIAGNOSIS — F909 Attention-deficit hyperactivity disorder, unspecified type: Secondary | ICD-10-CM | POA: Insufficient documentation

## 2019-03-18 DIAGNOSIS — T7491XS Unspecified adult maltreatment, confirmed, sequela: Secondary | ICD-10-CM | POA: Insufficient documentation

## 2019-03-18 DIAGNOSIS — F4312 Post-traumatic stress disorder, chronic: Secondary | ICD-10-CM | POA: Diagnosis not present

## 2019-03-18 DIAGNOSIS — F1110 Opioid abuse, uncomplicated: Secondary | ICD-10-CM | POA: Diagnosis not present

## 2019-03-18 DIAGNOSIS — F10982 Alcohol use, unspecified with alcohol-induced sleep disorder: Secondary | ICD-10-CM

## 2019-03-18 DIAGNOSIS — F14288 Cocaine dependence with other cocaine-induced disorder: Secondary | ICD-10-CM

## 2019-03-18 MED ORDER — CHLORDIAZEPOXIDE HCL 10 MG PO CAPS: ORAL_CAPSULE | ORAL | 0 refills | Status: DC

## 2019-03-18 MED ORDER — CHLORDIAZEPOXIDE HCL 25 MG PO CAPS: 25.0000 mg | ORAL_CAPSULE | Freq: Four times a day (QID) | ORAL | 0 refills | Status: DC

## 2019-03-18 MED ORDER — CHLORDIAZEPOXIDE HCL 5 MG PO CAPS: ORAL_CAPSULE | ORAL | 0 refills | Status: DC

## 2019-03-18 MED ORDER — BACLOFEN 10 MG PO TABS: 10.0000 mg | ORAL_TABLET | Freq: Three times a day (TID) | ORAL | 1 refills | Status: DC
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
## 2019-03-18 NOTE — Progress Notes (Signed)
Patient ID: Tammy Wilkinson, female   DOB: Feb 28, 1986, 33 y.o.   MRN: [id number] CD-IOP: The patient arrived a few minutes late for our scheduled orientation. She apologized and reported she had not gotten my voice mail message until too late. She reported she had visited with her husband and daughter this weekend, gone to [facility] and rode the paddle boat. She stated that she and her husband are getting along better and he will be picking her up after group is over this afternoon. The documentation for the orientation was reviewed and signed. Upon completion of the orientation, the patient was escorted to the group room and she began the CD-IOP.

## 2019-03-20 ENCOUNTER — Other Ambulatory Visit (HOSPITAL_COMMUNITY): Payer: BLUE CROSS/BLUE SHIELD | Attending: Psychiatry | Admitting: Licensed Clinical Social Worker

## 2019-03-20 ENCOUNTER — Other Ambulatory Visit: Payer: Self-pay

## 2019-03-20 DIAGNOSIS — F3131 Bipolar disorder, current episode depressed, mild: Secondary | ICD-10-CM | POA: Diagnosis not present

## 2019-03-20 DIAGNOSIS — Z91410 Personal history of adult physical and sexual abuse: Secondary | ICD-10-CM | POA: Diagnosis not present

## 2019-03-20 DIAGNOSIS — G4700 Insomnia, unspecified: Secondary | ICD-10-CM | POA: Diagnosis not present

## 2019-03-20 DIAGNOSIS — F1220 Cannabis dependence, uncomplicated: Secondary | ICD-10-CM | POA: Insufficient documentation

## 2019-03-20 DIAGNOSIS — Z811 Family history of alcohol abuse and dependence: Secondary | ICD-10-CM | POA: Diagnosis not present

## 2019-03-20 DIAGNOSIS — F1020 Alcohol dependence, uncomplicated: Secondary | ICD-10-CM | POA: Diagnosis present

## 2019-03-20 DIAGNOSIS — F4312 Post-traumatic stress disorder, chronic: Secondary | ICD-10-CM | POA: Insufficient documentation

## 2019-03-20 DIAGNOSIS — Z813 Family history of other psychoactive substance abuse and dependence: Secondary | ICD-10-CM | POA: Insufficient documentation

## 2019-03-20 DIAGNOSIS — F10230 Alcohol dependence with withdrawal, uncomplicated: Secondary | ICD-10-CM | POA: Diagnosis not present

## 2019-03-20 DIAGNOSIS — F1420 Cocaine dependence, uncomplicated: Secondary | ICD-10-CM | POA: Insufficient documentation

## 2019-03-20 DIAGNOSIS — F14288 Cocaine dependence with other cocaine-induced disorder: Secondary | ICD-10-CM

## 2019-03-20 DIAGNOSIS — F1110 Opioid abuse, uncomplicated: Secondary | ICD-10-CM | POA: Insufficient documentation

## 2019-03-20 DIAGNOSIS — Z79899 Other long term (current) drug therapy: Secondary | ICD-10-CM | POA: Diagnosis not present

## 2019-03-21 ENCOUNTER — Other Ambulatory Visit (HOSPITAL_COMMUNITY): Payer: BLUE CROSS/BLUE SHIELD | Admitting: Licensed Clinical Social Worker

## 2019-03-21 DIAGNOSIS — F10230 Alcohol dependence with withdrawal, uncomplicated: Secondary | ICD-10-CM | POA: Diagnosis not present

## 2019-03-21 DIAGNOSIS — F1110 Opioid abuse, uncomplicated: Secondary | ICD-10-CM

## 2019-03-21 DIAGNOSIS — F1020 Alcohol dependence, uncomplicated: Secondary | ICD-10-CM

## 2019-03-26 ENCOUNTER — Other Ambulatory Visit: Payer: Self-pay

## 2019-03-26 ENCOUNTER — Ambulatory Visit (HOSPITAL_COMMUNITY): Payer: BLUE CROSS/BLUE SHIELD | Admitting: Licensed Clinical Social Worker

## 2019-03-26 DIAGNOSIS — F1110 Opioid abuse, uncomplicated: Secondary | ICD-10-CM

## 2019-03-26 DIAGNOSIS — F1020 Alcohol dependence, uncomplicated: Secondary | ICD-10-CM

## 2019-03-26 DIAGNOSIS — F1220 Cannabis dependence, uncomplicated: Secondary | ICD-10-CM

## 2019-03-26 NOTE — Progress Notes (Signed)
    Daily Group Progress Note  Program: CD-IOP   Group Time: 1pm-2:30pm   Participation Level: Active   Behavioral Response: Appropriate   Type of Therapy: Process Group   Topic: Clinician checked in with group members, assessing for SI/HI/psychosis and overall level of functioning including substance use and community support and meeting attendance. Clinician and group members processed successes and challenges since last group as well as any noticing willfulness since the last group's topic. Clinician and group members read and discussed Daily Reflection and it's application to current individual recovery process. Clinician provided mindfulness meditation 'A Handful of Quiet.'       Group Time: 2:30pm-4pm   Participation Level: Active   Behavioral Response: Appropriate   Type of Therapy: Psycho-education Group   Topic: Clinician presented the topic 'Spirituality In Recovery.' Clinician and group members processed what spirituality means and how this is impacting their recovery. Clinician encouraged group members to consider what messages they received about spirituality growing up, and where they are not in contrast to view of spirituality when actively using. Clinician and group members discussed the importance of connection and ways to implement this in daily life, including meditation, helping others, attending meetings, gratitude, creative work, and using mindfulness during routine activities.   Summary: Client identified what religion and spirituality looked like growing up and how this effected her life. Client shares understanding of the need for higher power in recovery but is working on finding what that means to her at this time. Client shared finding it helpful to share group topics at home such as willingness and does feel supported in her treatment. Client shared positive interactions with daughter and suspected barriers to improving relationship. Client shows progress  toward goals as evidence by willingness to maintain sobriety and no substance use since 03/18/2019.   Family Program: Family present? No   Name of family member(s): N/A  UDS collected: Yes Results: pending  AA/NA attended?: No. Receptive to resources on face to face and virtual 12 step meetings.  Sponsor?: No   Shon Baton Danyle Boening, LCSW, LCAS

## 2019-03-26 NOTE — Progress Notes (Signed)
    Daily Group Progress Note  Program: CD-IOP   Group Time: 1pm-2:30pm  Participation Level: Active  Behavioral Response: Appropriate  Type of Therapy: Process Group  Topic: Clinician checked in with group members, assessing for SI/HI/psychosis and overall level of functioning including substance use and community support group meeting attendance. Clinician and group members discussed follow up thoughts of willingness and spirituality in recovery as discussed in previous groups. Clinician presented clients with the activity of completing a personal Mission statement. Clinician facilitated clients processing  statements, focusing on which sections were most and least difficult and the reason. Clinician actively listened to clients, praising willingness to be open. Clinician and group members discussed specific parts of acknowledgement and acceptance related to behaviors and changes.    Group Time: 2:30pm-4pm  Participation Level: Active  Behavioral Response: Appropriate  Type of Therapy: Psycho-education Group  Topic: Clinician provided clients with the skill of Progressive Muscle Relaxation and engage group in mindfulness exercise. Clinician provided psycho-education on the effects of substances on the body and brain chemistry as well as motivation level. Clinician and group members utilized 'Popsicle Sticks' to relate common phrases in recovery to current situations. Group member topics included intimacy, letting go of control, higher power, family, and willingness. Clinician inquired about self care plans over the holiday weekend.   Summary: Client reported sobriety date of 03/18/19. Client reports now involved with DSS; ROI signed for case worker. Client shared important goal of reuniting family and acknowledges that achieving and maintaining sobriety is the only path. Client shared husband and ex are supportive of her recovery however she believes it is more difficult for husband  due to more recent hurt. Client is able to identify moments of willing or willfulness in self and others. Client noted finding her topic of 'higher power' as ironic as she is still searching to identify what this means for her and her recovery. Client is receptive to feedback from group members. Client taking medications as prescribed, noting it is making her feel tired.   Family Program: Family present? No   Name of family member(s): N/A  UDS collected: No Results: from UDS on 03/18/19: naltrexone, oxycodone, oxymorphone, noroxycodone, dextromethorphan, Ethyl Glucuronide, Ethyl Sulfate, Cotinine. Client surprised by this due to stating she would likely be positive for cannabis and 'pills'  AA/NA attended?: No  Sponsor?: No   Isa Hitz A Ridwan Bondy, LCSW, LCAS

## 2019-03-27 ENCOUNTER — Other Ambulatory Visit (INDEPENDENT_AMBULATORY_CARE_PROVIDER_SITE_OTHER): Payer: BLUE CROSS/BLUE SHIELD | Admitting: Licensed Clinical Social Worker

## 2019-03-27 ENCOUNTER — Other Ambulatory Visit: Payer: Self-pay

## 2019-03-27 DIAGNOSIS — F1020 Alcohol dependence, uncomplicated: Secondary | ICD-10-CM

## 2019-03-27 DIAGNOSIS — F1110 Opioid abuse, uncomplicated: Secondary | ICD-10-CM

## 2019-03-27 DIAGNOSIS — F10982 Alcohol use, unspecified with alcohol-induced sleep disorder: Secondary | ICD-10-CM

## 2019-03-27 DIAGNOSIS — F1420 Cocaine dependence, uncomplicated: Secondary | ICD-10-CM

## 2019-03-27 DIAGNOSIS — F1220 Cannabis dependence, uncomplicated: Secondary | ICD-10-CM

## 2019-03-27 DIAGNOSIS — T7492XS Unspecified child maltreatment, confirmed, sequela: Secondary | ICD-10-CM

## 2019-03-27 DIAGNOSIS — F4312 Post-traumatic stress disorder, chronic: Secondary | ICD-10-CM

## 2019-03-27 DIAGNOSIS — Z6372 Alcoholism and drug addiction in family: Secondary | ICD-10-CM

## 2019-03-27 DIAGNOSIS — T7422XS Child sexual abuse, confirmed, sequela: Secondary | ICD-10-CM

## 2019-03-27 DIAGNOSIS — F10930 Alcohol use, unspecified with withdrawal, uncomplicated: Secondary | ICD-10-CM

## 2019-03-27 DIAGNOSIS — F10230 Alcohol dependence with withdrawal, uncomplicated: Secondary | ICD-10-CM | POA: Diagnosis not present

## 2019-03-27 DIAGNOSIS — T7491XS Unspecified adult maltreatment, confirmed, sequela: Secondary | ICD-10-CM

## 2019-03-27 DIAGNOSIS — F14288 Cocaine dependence with other cocaine-induced disorder: Secondary | ICD-10-CM

## 2019-03-27 DIAGNOSIS — F3131 Bipolar disorder, current episode depressed, mild: Secondary | ICD-10-CM
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
## 2019-03-28 ENCOUNTER — Other Ambulatory Visit (HOSPITAL_COMMUNITY): Payer: BLUE CROSS/BLUE SHIELD

## 2019-03-29 ENCOUNTER — Encounter (HOSPITAL_COMMUNITY): Payer: Self-pay | Admitting: Medical

## 2019-04-01 ENCOUNTER — Other Ambulatory Visit (HOSPITAL_COMMUNITY): Payer: BLUE CROSS/BLUE SHIELD | Admitting: Licensed Clinical Social Worker

## 2019-04-01 ENCOUNTER — Other Ambulatory Visit: Payer: Self-pay

## 2019-04-01 DIAGNOSIS — F1020 Alcohol dependence, uncomplicated: Secondary | ICD-10-CM

## 2019-04-01 DIAGNOSIS — F1220 Cannabis dependence, uncomplicated: Secondary | ICD-10-CM

## 2019-04-01 DIAGNOSIS — F1110 Opioid abuse, uncomplicated: Secondary | ICD-10-CM

## 2019-04-02 ENCOUNTER — Ambulatory Visit (HOSPITAL_COMMUNITY): Payer: BLUE CROSS/BLUE SHIELD | Admitting: Licensed Clinical Social Worker

## 2019-04-02 ENCOUNTER — Telehealth (HOSPITAL_COMMUNITY): Payer: Self-pay | Admitting: Licensed Clinical Social Worker

## 2019-04-02 NOTE — Telephone Encounter (Signed)
Attempted contact via phone after WebEx therapy session was not attended. Left message requesting return call

## 2019-04-03 ENCOUNTER — Other Ambulatory Visit: Payer: Self-pay

## 2019-04-03 ENCOUNTER — Other Ambulatory Visit (HOSPITAL_COMMUNITY): Payer: BLUE CROSS/BLUE SHIELD | Admitting: Licensed Clinical Social Worker

## 2019-04-03 DIAGNOSIS — F1110 Opioid abuse, uncomplicated: Secondary | ICD-10-CM

## 2019-04-03 DIAGNOSIS — F1020 Alcohol dependence, uncomplicated: Secondary | ICD-10-CM

## 2019-04-03 DIAGNOSIS — F10230 Alcohol dependence with withdrawal, uncomplicated: Secondary | ICD-10-CM | POA: Diagnosis not present

## 2019-04-03 DIAGNOSIS — F419 Anxiety disorder, unspecified: Secondary | ICD-10-CM

## 2019-04-03 DIAGNOSIS — F1220 Cannabis dependence, uncomplicated: Secondary | ICD-10-CM

## 2019-04-03 DIAGNOSIS — F329 Major depressive disorder, single episode, unspecified: Secondary | ICD-10-CM

## 2019-04-04 ENCOUNTER — Other Ambulatory Visit (HOSPITAL_COMMUNITY): Payer: BLUE CROSS/BLUE SHIELD | Admitting: Licensed Clinical Social Worker

## 2019-04-04 DIAGNOSIS — F1020 Alcohol dependence, uncomplicated: Secondary | ICD-10-CM

## 2019-04-04 DIAGNOSIS — F329 Major depressive disorder, single episode, unspecified: Secondary | ICD-10-CM

## 2019-04-04 DIAGNOSIS — F1110 Opioid abuse, uncomplicated: Secondary | ICD-10-CM

## 2019-04-04 DIAGNOSIS — F1220 Cannabis dependence, uncomplicated: Secondary | ICD-10-CM

## 2019-04-04 DIAGNOSIS — F10230 Alcohol dependence with withdrawal, uncomplicated: Secondary | ICD-10-CM | POA: Diagnosis not present

## 2019-04-04 DIAGNOSIS — F419 Anxiety disorder, unspecified: Secondary | ICD-10-CM

## 2019-04-05 ENCOUNTER — Other Ambulatory Visit (HOSPITAL_COMMUNITY): Payer: Self-pay | Admitting: Psychiatry

## 2019-04-05 ENCOUNTER — Telehealth (HOSPITAL_COMMUNITY): Payer: Self-pay

## 2019-04-05 NOTE — Progress Notes (Addendum)
    Daily Group Progress Note  Program: CD-IOP   Group Time: 1pm-2:30pm  Participation Level: Active  Behavioral Response: Appropriate  Type of Therapy: Process Group  Topic: Clinician met with clients, assessing for SI/HI/psychosis and overall level of functioning including attendance of recovery meetings and relapse or challenges to sobriety. Clinician and group members discussed highs and lows from previous days and reflection on topic from previous day. Clinician and group members read Daily Reflection and clinician facilitated discussion on fear in recovery.   Group Time: 2:30pm-4pm  Participation Level: Active  Behavioral Response: Appropriate  Type of Therapy: Psycho-education Group  Topic: Clinician provided psycho-educational group on PAWS. Clinician provided supplemental video with discussion on common PAWS symptoms and skills to help with management. Clinician utilized Post Acute Withdrawal (PAW) Self-Evaluation developed by Patrina Levering. Clinician and group members discussed the importance of being aware and tracking symptoms during recovery.    Summary: Client engaged in group discussions appropriately. Client started discussion on changes I sobriety dates as hers is now 03/30/19 from all substances, noting she was offered marijuana over the weekend, thought about saying no vs the consequences of her behaviors, but smoked anyway. Client started discussion on shame or guilt related to fear in early recovery. Client identified several PAWS symptoms she notices when she stops substances, especially identifying with periods of feeling 'fine' when mood swings or cravings seeming to come without trigger. Client is looking forward to spending more time with her daughters over the weekend.   Family Program: Family present? No   Name of family member(s): NA  UDS collected: No Results: From 04/01/19: Naltrexon, Dextromethorphan, THC, Cotinine. Client reports smoking pot over  the weekend and being embarrassed to share relapse in group.  AA/NA attended?: None.   Sponsor?: No   Shon Baton , LCSW, LCAS

## 2019-04-05 NOTE — Telephone Encounter (Signed)
If patient is in [place] than provider from the program need to be prescribe his current medications.

## 2019-04-05 NOTE — Telephone Encounter (Signed)
Medication refill requests - Faxes received from pt's [facility] for refills of her prescribed Lamictal, Naltrexone, Trazodone, Venlafaxine ER, and Hydroxyzine. Pt. currently in [place].  Meds last ordered 03/07/19.

## 2019-04-05 NOTE — Progress Notes (Signed)
    Daily Group Progress Note  Program: CD-IOP   Group Time: 1pm-2:30pm  Participation Level: Active  Behavioral Response: Appropriate  Type of Therapy: Process Group  Topic: Clinician checked in with group members, assessing for SI/HI/psychosis and overall level of functioning including relapse and barriers to recovery. Clinician and group members discussed highlights and struggles since last group related to maintaining sobriety including identified triggers and responses. Clinician and group members processed Daily Reflection and  personal meeting to current work in recovery.    Group Time: 2:30pm-4pm  Participation Level: Active  Behavioral Response: Appropriate  Type of Therapy: Psycho-education Group  Topic: Clinician provided mindfulness activity in session for participation of clients. Clinician presented the psycho-education topic of Resentment and Forgiveness. Clinician and group members discussed roles of resentment in early recovery. Clinician and group members identified a small resentment and described related thoughts, behaviors, and physical sensations.    Summary: Client is able to identify a resentment she is working on. Client shares thoughts related to husband and childrens' possible resentment based on her behavior. Client shared thoughts on differences between forgiveness and forgetting and how this could effect her relationship with her family.   Family Program: Family present? No   Name of family member(s): NA  UDS collected: Yes Results: From 03/27/19 UDS: THC; reported (reports not received until 03/29/19) Sobriety date previously reported 8/31  AA/NA attended?: 1  Sponsor?: No   Karissa A Brone, LCSW, LCAS

## 2019-04-08 ENCOUNTER — Other Ambulatory Visit (HOSPITAL_COMMUNITY): Payer: Self-pay

## 2019-04-08 ENCOUNTER — Other Ambulatory Visit: Payer: Self-pay

## 2019-04-08 ENCOUNTER — Other Ambulatory Visit (HOSPITAL_COMMUNITY): Payer: BLUE CROSS/BLUE SHIELD | Admitting: Licensed Clinical Social Worker

## 2019-04-08 DIAGNOSIS — F1220 Cannabis dependence, uncomplicated: Secondary | ICD-10-CM

## 2019-04-08 DIAGNOSIS — F3131 Bipolar disorder, current episode depressed, mild: Secondary | ICD-10-CM

## 2019-04-08 DIAGNOSIS — F411 Generalized anxiety disorder: Secondary | ICD-10-CM

## 2019-04-08 DIAGNOSIS — F14288 Cocaine dependence with other cocaine-induced disorder: Secondary | ICD-10-CM

## 2019-04-08 DIAGNOSIS — F329 Major depressive disorder, single episode, unspecified: Secondary | ICD-10-CM

## 2019-04-08 DIAGNOSIS — F10230 Alcohol dependence with withdrawal, uncomplicated: Secondary | ICD-10-CM | POA: Diagnosis not present

## 2019-04-08 DIAGNOSIS — F1110 Opioid abuse, uncomplicated: Secondary | ICD-10-CM

## 2019-04-08 DIAGNOSIS — F1020 Alcohol dependence, uncomplicated: Secondary | ICD-10-CM

## 2019-04-08 DIAGNOSIS — F32A Depression, unspecified: Secondary | ICD-10-CM

## 2019-04-08 DIAGNOSIS — F1010 Alcohol abuse, uncomplicated: Secondary | ICD-10-CM

## 2019-04-08 MED ORDER — LAMOTRIGINE 150 MG PO TABS: 150.0000 mg | ORAL_TABLET | Freq: Two times a day (BID) | ORAL | 0 refills | Status: DC

## 2019-04-08 MED ORDER — VENLAFAXINE HCL ER 150 MG PO CP24: 150.0000 mg | ORAL_CAPSULE | Freq: Every day | ORAL | 0 refills | Status: DC

## 2019-04-08 MED ORDER — NALTREXONE HCL 50 MG PO TABS: 50.0000 mg | ORAL_TABLET | Freq: Every day | ORAL | 0 refills | Status: DC

## 2019-04-08 MED ORDER — HYDROXYZINE PAMOATE 25 MG PO CAPS: 25.0000 mg | ORAL_CAPSULE | Freq: Two times a day (BID) | ORAL | 0 refills | Status: DC | PRN

## 2019-04-08 MED ORDER — TRAZODONE HCL 100 MG PO TABS: 100.0000 mg | ORAL_TABLET | Freq: Every day | ORAL | 0 refills | Status: DC

## 2019-04-08 NOTE — Progress Notes (Signed)
    Daily Group Progress Note  Program: CD-IOP   Group Time: 1pm-2pm  Participation Level: Active  Behavioral Response: Appropriate  Type of Therapy: Process Group  Topic: Clinician checked in with group members, assessing for SI/HI/psychosis and overall level of functioning including relapse and barriers to recovery. Clinician and group members discussed highlights and struggles since last group related to maintaining sobriety including identified triggers and responses. Clinician and group members processed Daily Reflection and  personal meeting to current work in recovery. Clinician provided mindfulness activity.   Group Time: 2pm-4pm  Participation Level: Active  Behavioral Response: Appropriate  Type of Therapy: Psycho-education Group  Topic: Clinician and group members reviewed accomplishments and needs, including physical, emotional, cognitive, and social needs. Clinician and group members discussed what it looks like to have needs met, and what it looks like when their personal needs are not met. Clinician facilitated discussion on self-awareness of feelings, unmet needs, and fears related to asking for help. Clients provided homework of completing My Needs Instructional Guide to discuss following group.   Summary: Client reports 9/12 as her sobriety date. Client stated she attended one meeting over the weekend. Client reports spending more time with her children and feeling proud when oldest daughter sent a text stating she was proud of changes being made. Client is able to identify values and accomplishments as well as identify needs being met and not met. Client unmet needs revolved around forgiveness/acceptance, respect, trust. Client plans to eat meals regularly, drink water, and spend time with her family to support recovery between now and next group.   Family Program: Family present? No   Name of family member(s): N/A  UDS collected: Yes Results: from previous UDS  [facility] (admitted last use 9/12) and cough syrup which client previously denied use.  AA/NA attended?: Yes 1  Sponsor?: No   Anh Bigos A Kristyne Woodring, LCSW, LCAS

## 2019-04-09 ENCOUNTER — Other Ambulatory Visit: Payer: Self-pay

## 2019-04-09 ENCOUNTER — Ambulatory Visit (HOSPITAL_COMMUNITY): Payer: BLUE CROSS/BLUE SHIELD | Admitting: Licensed Clinical Social Worker

## 2019-04-09 NOTE — Progress Notes (Signed)
Virtual Visit via Telephone Note  I connected with Tammy Wilkinson on 46/50/35 at  4:00 PM EDT by telephone and verified that I am speaking with the correct person using two identifiers.   I discussed the limitations, risks, security and privacy concerns of performing an evaluation and management service by telephone and the availability of in person appointments. I also discussed with the patient that there may be a patient responsible charge related to this service. The patient expressed understanding and agreed to proceed.  I discussed the assessment and treatment plan with the patient. The patient was provided an opportunity to ask questions and all were answered. The patient agreed with the plan and demonstrated an understanding of the instructions.   The patient was advised to call back or seek an in-person evaluation if the symptoms worsen or if the condition fails to improve as anticipated.  I provided 30 minutes of non-face-to-face time during this encounter.   [name], LCSW    THERAPIST PROGRESS NOTE  Session Time: 4:15pm-4:45pm  Participation Level: Active  Behavioral Response: NAAlertEuthymic  Type of Therapy: Individual Therapy  Treatment Goals addressed: Coping and Diagnosis: substance use disorder  Interventions: Motivational Interviewing and Supportive  Summary: Tammy Wilkinson is a 33 y.o. female who presents with substance use disorder in CDIOP. Client is able to identify how group topics relate to current life situations. Client inquired about DSS contact.   Suicidal/Homicidal: Nowithout intent/plan  Therapist Response: Clinician checked in with client for individual session in conjunction with CDIOP program. Clinician inquired about stressors and perceived progress in group.  Plan: Return again in 1 weeks.  Diagnosis: Axis I: Substance Abuse       [name], LCSW 03/26/19

## 2019-04-09 NOTE — Progress Notes (Signed)
    Daily Group Progress Note  Program: CD-IOP   Group Time: 1pm-2pm  Participation Level: Active  Behavioral Response: Appropriate  Type of Therapy: Process Group  Topic: Clinician checked in with group members, assessing for SI/HI/psychosis and overall level of functioning including relapse and barriers to recovery. Clinician and group members discussed highlights and struggles since last group related to maintaining sobriety including identified triggers and responses. Clinician and group members processed Daily Reflection and  personal meeting to current work in recovery.    Group Time: 2pm-4pm  Participation Level: Active  Behavioral Response: Appropriate  Type of Therapy: Psycho-education Group  Topic: Clinician presented psychoeducational material on ' Feelings, Thoughts, and Mind Traps: A Guide to [employer]' from [employer] Clinician facilitated discussion with clients on types of distorted thinking styles and how to challenge thoughts. Clinician allowed time for clients to discuss examples of distorted thoughts as well as help other group members create more helpful thoughts and phrases. Clinician wrapped up with Steps for challenging mind traps. Clinician continued with Roadblocks to Healthy Thinking 'Ways of Thinking That Keep You Stuck.' Clinician facilitated conversation related to how distorted thinking effects behaviors. Clinician reminded group members of Cognitive Triangle and the connection between thoughts, feelings, and behaviors. Clinician and group members discussed when examples are genuine (ex: confusion) vs when they are unhelpful and help avoid taking responsibility for own behaviors.  Clinician closed requested a self-care activity to support recovery planned between now and next group.   Summary: Client engaged in group discussions. Client is able to identify result of distorted thinking in own life. Client is able to create alternative thoughts.  Client states a goal is working on holding self accountable. Client is able to identify how distorted thinking has impacted both substance use and mental health symptoms. Client was able to review cognitive triangle with clinician and connect changes in thoughts with changes in feelings and behaviors.  Family Program: Family present? No   Name of family member(s): NA  UDS collected: Yes Results: pending  AA/NA attended?: No  Susette Seminara A Isom Kochan, LCSW, LCAS

## 2019-04-09 NOTE — Progress Notes (Signed)
    Daily Group Progress Note  Program: CD-IOP   Group Time: 1pm-2:30pm  Participation Level: Active  Behavioral Response: Appropriate  Type of Therapy: Psycho-education Group  Topic:  Clinician checked in with group members assessing for SI/HI/psychosis and overall level of functioning including sobriety date and community support meetings attended. Pharmacist facilitated psycho-educational group on medications used to treat addiction as well as mental health symptoms. Clients were provided time to ask questions.    Group Time: 2:30pm-4pm  Participation Level: Active  Behavioral Response: Appropriate  Type of Therapy: Process Group  Topic: Clinician facilitated process group. Clinician and group members processed recent stressors. Clinician facilitated discussion on Letting Go of Emotional Suffering: Mindfulness of Your Current Emotion. Clinician and group members discussed observing and experiencing emotions without judgement or acting on urges.    Summary: Client engaged with questions for pharmacist related to medications and possible side effects. Client shared recent stressors relating to family and several bills being in other people's names.Client states this is why she missed individual appointment from previous day. Client reports finding mindfulness difficult both in and outside of group setting. Client states she is working on sitting with uncomfortable feelings and not acting on them, but finds it extremely uncomfortable.   Family Program: Family present? No   Name of family member(s): NA  UDS collected: No Results: pending  AA/NA attended?: No  Sponsor?: No   [name], LCSW

## 2019-04-10 ENCOUNTER — Encounter (HOSPITAL_COMMUNITY): Payer: Self-pay | Admitting: Medical

## 2019-04-10 ENCOUNTER — Other Ambulatory Visit: Payer: Self-pay

## 2019-04-10 ENCOUNTER — Other Ambulatory Visit (INDEPENDENT_AMBULATORY_CARE_PROVIDER_SITE_OTHER): Payer: BLUE CROSS/BLUE SHIELD | Admitting: Medical

## 2019-04-10 DIAGNOSIS — F329 Major depressive disorder, single episode, unspecified: Secondary | ICD-10-CM

## 2019-04-10 DIAGNOSIS — F14288 Cocaine dependence with other cocaine-induced disorder: Secondary | ICD-10-CM

## 2019-04-10 DIAGNOSIS — F1020 Alcohol dependence, uncomplicated: Secondary | ICD-10-CM

## 2019-04-10 DIAGNOSIS — F10982 Alcohol use, unspecified with alcohol-induced sleep disorder: Secondary | ICD-10-CM

## 2019-04-10 DIAGNOSIS — F1110 Opioid abuse, uncomplicated: Secondary | ICD-10-CM

## 2019-04-10 DIAGNOSIS — F419 Anxiety disorder, unspecified: Secondary | ICD-10-CM

## 2019-04-10 DIAGNOSIS — F1220 Cannabis dependence, uncomplicated: Secondary | ICD-10-CM

## 2019-04-10 DIAGNOSIS — F10230 Alcohol dependence with withdrawal, uncomplicated: Secondary | ICD-10-CM | POA: Diagnosis not present

## 2019-04-10 DIAGNOSIS — F4312 Post-traumatic stress disorder, chronic: Secondary | ICD-10-CM

## 2019-04-10 DIAGNOSIS — Z6372 Alcoholism and drug addiction in family: Secondary | ICD-10-CM

## 2019-04-10 DIAGNOSIS — T7422XS Child sexual abuse, confirmed, sequela: Secondary | ICD-10-CM

## 2019-04-10 DIAGNOSIS — F10930 Alcohol use, unspecified with withdrawal, uncomplicated: Secondary | ICD-10-CM

## 2019-04-10 DIAGNOSIS — T7492XS Unspecified child maltreatment, confirmed, sequela: Secondary | ICD-10-CM

## 2019-04-10 DIAGNOSIS — T7491XS Unspecified adult maltreatment, confirmed, sequela: Secondary | ICD-10-CM

## 2019-04-10 DIAGNOSIS — F1420 Cocaine dependence, uncomplicated: Secondary | ICD-10-CM

## 2019-04-10 MED ORDER — PREGABALIN 150 MG PO CAPS: 150.0000 mg | ORAL_CAPSULE | Freq: Three times a day (TID) | ORAL | 1 refills | Status: DC
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
## 2019-04-10 NOTE — Telephone Encounter (Signed)
These m3edications were prescribed prior to CDIOP -She ran out while in CD IOP I am happy to continue them but was not aware of situation as am only here 2 days.I see Dr Adele Schilder has given her refills and is due to see her 9/24. I will be seeing her today. Oneita Kras has told me she got her meds straightened out

## 2019-04-11 ENCOUNTER — Encounter (HOSPITAL_COMMUNITY): Payer: BLUE CROSS/BLUE SHIELD

## 2019-04-15 ENCOUNTER — Other Ambulatory Visit: Payer: Self-pay

## 2019-04-15 ENCOUNTER — Other Ambulatory Visit (HOSPITAL_COMMUNITY): Payer: BLUE CROSS/BLUE SHIELD | Admitting: Licensed Clinical Social Worker

## 2019-04-15 DIAGNOSIS — F329 Major depressive disorder, single episode, unspecified: Secondary | ICD-10-CM

## 2019-04-15 DIAGNOSIS — F1220 Cannabis dependence, uncomplicated: Secondary | ICD-10-CM

## 2019-04-15 DIAGNOSIS — F1110 Opioid abuse, uncomplicated: Secondary | ICD-10-CM

## 2019-04-15 DIAGNOSIS — F10230 Alcohol dependence with withdrawal, uncomplicated: Secondary | ICD-10-CM | POA: Diagnosis not present

## 2019-04-15 DIAGNOSIS — F1020 Alcohol dependence, uncomplicated: Secondary | ICD-10-CM

## 2019-04-15 DIAGNOSIS — F419 Anxiety disorder, unspecified: Secondary | ICD-10-CM

## 2019-04-16 NOTE — Progress Notes (Signed)
    Daily Group Progress Note  Program: CD-IOP   Group Time: 1pm-2:30pm  Participation Level: Active  Behavioral Response: Appropriate  Type of Therapy: Process Group  Topic: Clinician checked in with group members, assessing for SI/HI/psychosis and overall level of functioning. Clinician inquired about highs and lows of the weekend including barriers to sobriety and coping skills attempted. Clinician and group members read and processed Daily Reflection and how it applies to current engagement in recovery. Clinician provided guided visualization activity.   Group Time: 2:30pm-4pm  Participation Level: Active  Behavioral Response: Appropriate  Type of Therapy: Psycho-education Group  Topic: Clinician and group members reviewed communication and boundaries discussions from previous week. Clinician and group members role played using assertive communication for setting boundaries with friends and family. Clinician presented the topic of Values. Clinician and group members engage in activity to identify core values and discussed how this effects where boundaries are set. Clinician and group members discussed how priorities and boundaries have changed with life circumstances and the importance of being surrounded by people with similar values for support as well as the consequences of being around people who have opposite values, unsupportive of client recovery. Clinician inquired about plans for self-care and to support recovery before next group.   Summary: Client sobriety date 04/13/19 with 1 12 step meeting attended on Saturday. Client report relapse on Friday on 'pills and pot' which were available, and states likely would have had alcohol if it were available but she did not seek it out. She attended meeting the following day. Client reports strain with finding out form her daughter her husband is in a new relationship. Client identified how values have changed since becoming sober and  trying to use reuniting family as motivation to maintain sobriety. Client is trying to spend more time with her daughters who she reports bring her the most joy. Client was able to demonstrate assertive communication skills with setting boundaries however reports the follow through long term will be the more difficult part. Client shows progress toward goals as evidence by increased insight into distorted thinking and connection of thoughts, feelings and behaviors, however struggles to recognize connection prior to use leading to relapse.   Family Program: Family present? No   Name of family member(s): N/A  UDS collected: Yes Results: pending. Client reported will be positive for [facility] and 'pills'  AA/NA attended? Yes, 1 Saturday  Sponsor?: No   Oneita Kras A Layth Cerezo, LCSW, LCAS

## 2019-04-17 ENCOUNTER — Other Ambulatory Visit: Payer: Self-pay

## 2019-04-17 ENCOUNTER — Other Ambulatory Visit (HOSPITAL_COMMUNITY): Payer: BLUE CROSS/BLUE SHIELD | Admitting: Licensed Clinical Social Worker

## 2019-04-17 DIAGNOSIS — F1110 Opioid abuse, uncomplicated: Secondary | ICD-10-CM

## 2019-04-17 DIAGNOSIS — F1220 Cannabis dependence, uncomplicated: Secondary | ICD-10-CM

## 2019-04-17 DIAGNOSIS — F1020 Alcohol dependence, uncomplicated: Secondary | ICD-10-CM

## 2019-04-17 DIAGNOSIS — F10230 Alcohol dependence with withdrawal, uncomplicated: Secondary | ICD-10-CM | POA: Diagnosis not present

## 2019-04-17 NOTE — Progress Notes (Signed)
    Daily Group Progress Note  Program: CD-IOP   Group Time: 1pm-2:30pm  Participation Level: Active  Behavioral Response: Appropriate, Sharing and Motivated  Type of Therapy: Process Group  Topic: Clinician checked in with group members, assessing for SI/HI/psychosis and overall level of functioning including substance use and community support group attendance. Clinician and group members processed any barriers to recovery since previous group. Clinician and group members reviewed 'Adult Core Belief Clusters' and discussed intensity of beliefs of adaptive vs irrational thoughts. Clinician and group members processed values and acceptable behaviors modeled growing up and how this shaped beliefs. Clinician and group members discussed how those thoughts and behaviors affected their own parenting styles.   Group Time: 2:30pm-4pm  Participation Level: Active  Behavioral Response: Appropriate  Type of Therapy: Psycho-education Group  Topic: Psycho-educational material provided on Laughter Yoga. Clinician and group members watched material on Laughter Yoga Movement by Dr. Nelda Severe. Clinician and group members participated in laughter activity in session. Clinician provided additional 5-7-9 breathing technique. Clinician and group members discussed types of community support groups including AA, NA Celebrate Recovery and SMART Recovery.    Summary: Client shared thoughts and feelings related to husband's new relationship and recent conversations with her mother. Client engaged in group activity, finding a positive change in mood. Client attempted breathing skill in session and found it helpful in slowing heart rate. Client is open to trying skill more frequently. Client is interested in SMART recovery program and gender specific support groups.   Family Program: Family present? No   Name of family member(s): NA  UDS collected: No Results: previous results positive for cannabis and  opiates  AA/NA attended?: No  Sponsor?: No   Emmalie Haigh A Rawlin Reaume, LCSW, LCAS

## 2019-04-18 ENCOUNTER — Other Ambulatory Visit (HOSPITAL_COMMUNITY): Payer: BLUE CROSS/BLUE SHIELD | Attending: Psychiatry | Admitting: Licensed Clinical Social Worker

## 2019-04-18 DIAGNOSIS — Z9119 Patient's noncompliance with other medical treatment and regimen: Secondary | ICD-10-CM | POA: Diagnosis not present

## 2019-04-18 DIAGNOSIS — Z79899 Other long term (current) drug therapy: Secondary | ICD-10-CM | POA: Insufficient documentation

## 2019-04-18 DIAGNOSIS — F1220 Cannabis dependence, uncomplicated: Secondary | ICD-10-CM | POA: Insufficient documentation

## 2019-04-18 DIAGNOSIS — F4312 Post-traumatic stress disorder, chronic: Secondary | ICD-10-CM | POA: Insufficient documentation

## 2019-04-18 DIAGNOSIS — G4700 Insomnia, unspecified: Secondary | ICD-10-CM | POA: Diagnosis not present

## 2019-04-18 DIAGNOSIS — F1420 Cocaine dependence, uncomplicated: Secondary | ICD-10-CM | POA: Diagnosis not present

## 2019-04-18 DIAGNOSIS — F1110 Opioid abuse, uncomplicated: Secondary | ICD-10-CM | POA: Diagnosis present

## 2019-04-18 DIAGNOSIS — F1020 Alcohol dependence, uncomplicated: Secondary | ICD-10-CM | POA: Diagnosis not present

## 2019-04-18 DIAGNOSIS — F3131 Bipolar disorder, current episode depressed, mild: Secondary | ICD-10-CM | POA: Diagnosis not present

## 2019-04-18 NOTE — Progress Notes (Signed)
    Daily Group Progress Note  Program: CD-IOP   Group Time: 1pm-2:30pm  Participation Level: Active  Behavioral Response: Appropriate  Type of Therapy: Process Group  Topic: Clinician checked in with group members, assessing for SI/HI/psychosis and overall level of functioning including triggers, cravings, and community meetings attended. Clinician and group members processed highs and lows from previous day and reviewed thoughts from previous topics of development of core beliefs. Clinician and group members read and discussed daily reflection. Clinician provided mindfulness activity.   Group Time: 2:30pm-4pm  Participation Level: Active  Behavioral Response: Appropriate  Type of Therapy: Psycho-education Group  Topic: Clinician presented the topic of Family Roles. Clinician and group members reviewed family roles and discussed how family members, including self, fit into different roles. Clinician and group members reviewed parenting report card and how these behaviors being modeled effected how they interacted with their family of origin. Clinician and group members created family trees to track patterns of behaviors. Clinician provided group members with multiple breathing exercises to try over the weekend.     Summary: Sobriety date 04/13/19, 0 meetings attended since last group. Client share struggle with cravings and unhelpful thoughts related to relationships. Client identified some irrational core beliefs as more believed than healthy and provided examples where that had been supported. Client is able to identify family roles for herself and others and discussed thoughts about her family or origin and where people fit with her current family, including her children. Client identified several family patterns separate of substance abuse including anger concerns in the family.   Family Program: Family present? No   Name of family member(s): NA  UDS collected: No Results:  pending  AA/NA attended?: No. Looking into SMART Recovery  Sponsor?: No   [name], LCSW

## 2019-04-22 ENCOUNTER — Encounter (HOSPITAL_COMMUNITY): Payer: BLUE CROSS/BLUE SHIELD

## 2019-04-23 ENCOUNTER — Ambulatory Visit (INDEPENDENT_AMBULATORY_CARE_PROVIDER_SITE_OTHER): Payer: BLUE CROSS/BLUE SHIELD | Admitting: Licensed Clinical Social Worker

## 2019-04-23 ENCOUNTER — Other Ambulatory Visit: Payer: Self-pay

## 2019-04-23 DIAGNOSIS — F1020 Alcohol dependence, uncomplicated: Secondary | ICD-10-CM | POA: Diagnosis not present

## 2019-04-23 DIAGNOSIS — F1220 Cannabis dependence, uncomplicated: Secondary | ICD-10-CM | POA: Insufficient documentation

## 2019-04-23 DIAGNOSIS — F4312 Post-traumatic stress disorder, chronic: Secondary | ICD-10-CM

## 2019-04-23 DIAGNOSIS — F1110 Opioid abuse, uncomplicated: Secondary | ICD-10-CM

## 2019-04-23 NOTE — Progress Notes (Signed)
Virtual Visit via Telephone Note  I connected with Tammy Wilkinson on 65/68/12 at  1:00 PM EDT by telephone and verified that I am speaking with the correct person using two identifiers.   I discussed the limitations, risks, security and privacy concerns of performing an evaluation and management service by telephone and the availability of in person appointments. I also discussed with the patient that there may be a patient responsible charge related to this service. The patient expressed understanding and agreed to proceed.   I discussed the assessment and treatment plan with the patient. The patient was provided an opportunity to ask questions and all were answered. The patient agreed with the plan and demonstrated an understanding of the instructions.   The patient was advised to call back or seek an in-person evaluation if the symptoms worsen or if the condition fails to improve as anticipated.  I provided 35 minutes of non-face-to-face time during this encounter.   [name], LCSW    THERAPIST PROGRESS NOTE  Session Time: 1:15-1:50  Participation Level: Active  Behavioral Response: NAAlertAnxious and Dysphoric  Type of Therapy: Individual Therapy  Treatment Goals addressed: Coping and Diagnosis: opioid use disorder  Interventions: CBT, DBT, Motivational Interviewing and Other: Client with achieve and maintain sobriety 7/7 days per week. Client will implement healthy coping skills at least 1 time per day at least 4 times per week.  Summary: Tammy Wilkinson is a 33 y.o. female who presents with polysubstance abuse. Client reports using 'pills' previous night and over the weekend when finding out her husband's girlfriend was a mutual friend. Client was able to identified related feelings and thoughts to situation including upset, angry, scared. Client endorsed worry about ability to maintain long term sobriety which is why she had continued contact with a dealer. Client  agreed to block the contact in her phone as recommended by her father.   Suicidal/Homicidal: Nowithout intent/plan  Therapist Response: Client met with client via telehealth. Clinician processed with client use previous day and over the weekend. Clinician and client reviewed thoughts and feelings prior to behaviors. Clinician developed discrepancies between client reporting wanting to stay sober and keeping contact with person who previously provided her with substances.   Plan: Return again in 1 weeks.  Diagnosis: Axis I: Substance Abuse       [name], LCSW 04/23/2019

## 2019-04-24 ENCOUNTER — Other Ambulatory Visit: Payer: Self-pay

## 2019-04-24 ENCOUNTER — Other Ambulatory Visit (HOSPITAL_COMMUNITY): Payer: BLUE CROSS/BLUE SHIELD | Admitting: Licensed Clinical Social Worker

## 2019-04-24 DIAGNOSIS — F1110 Opioid abuse, uncomplicated: Secondary | ICD-10-CM | POA: Diagnosis not present

## 2019-04-24 DIAGNOSIS — F1220 Cannabis dependence, uncomplicated: Secondary | ICD-10-CM

## 2019-04-24 DIAGNOSIS — F1120 Opioid dependence, uncomplicated: Secondary | ICD-10-CM

## 2019-04-24 DIAGNOSIS — F3131 Bipolar disorder, current episode depressed, mild: Secondary | ICD-10-CM

## 2019-04-24 DIAGNOSIS — F1020 Alcohol dependence, uncomplicated: Secondary | ICD-10-CM

## 2019-04-25 ENCOUNTER — Other Ambulatory Visit (HOSPITAL_COMMUNITY): Payer: BLUE CROSS/BLUE SHIELD | Admitting: Licensed Clinical Social Worker

## 2019-04-25 DIAGNOSIS — F3131 Bipolar disorder, current episode depressed, mild: Secondary | ICD-10-CM

## 2019-04-25 DIAGNOSIS — F1020 Alcohol dependence, uncomplicated: Secondary | ICD-10-CM

## 2019-04-25 DIAGNOSIS — F1220 Cannabis dependence, uncomplicated: Secondary | ICD-10-CM

## 2019-04-25 DIAGNOSIS — F1110 Opioid abuse, uncomplicated: Secondary | ICD-10-CM | POA: Diagnosis not present

## 2019-04-25 DIAGNOSIS — F1120 Opioid dependence, uncomplicated: Secondary | ICD-10-CM

## 2019-04-29 ENCOUNTER — Ambulatory Visit (INDEPENDENT_AMBULATORY_CARE_PROVIDER_SITE_OTHER): Payer: BLUE CROSS/BLUE SHIELD | Admitting: Medical

## 2019-04-29 ENCOUNTER — Encounter (HOSPITAL_COMMUNITY): Payer: Self-pay | Admitting: Medical

## 2019-04-29 DIAGNOSIS — F1020 Alcohol dependence, uncomplicated: Secondary | ICD-10-CM

## 2019-04-29 DIAGNOSIS — Z6372 Alcoholism and drug addiction in family: Secondary | ICD-10-CM

## 2019-04-29 DIAGNOSIS — T7422XS Child sexual abuse, confirmed, sequela: Secondary | ICD-10-CM

## 2019-04-29 DIAGNOSIS — F3131 Bipolar disorder, current episode depressed, mild: Secondary | ICD-10-CM

## 2019-04-29 DIAGNOSIS — Z5329 Procedure and treatment not carried out because of patient's decision for other reasons: Secondary | ICD-10-CM

## 2019-04-29 DIAGNOSIS — T7492XS Unspecified child maltreatment, confirmed, sequela: Secondary | ICD-10-CM

## 2019-04-29 DIAGNOSIS — F1110 Opioid abuse, uncomplicated: Secondary | ICD-10-CM | POA: Diagnosis not present

## 2019-04-29 DIAGNOSIS — F4312 Post-traumatic stress disorder, chronic: Secondary | ICD-10-CM

## 2019-04-29 DIAGNOSIS — F419 Anxiety disorder, unspecified: Secondary | ICD-10-CM

## 2019-04-29 DIAGNOSIS — F14288 Cocaine dependence with other cocaine-induced disorder: Secondary | ICD-10-CM

## 2019-04-29 DIAGNOSIS — F329 Major depressive disorder, single episode, unspecified: Secondary | ICD-10-CM

## 2019-04-29 DIAGNOSIS — F1220 Cannabis dependence, uncomplicated: Secondary | ICD-10-CM

## 2019-04-29 DIAGNOSIS — T7491XS Unspecified adult maltreatment, confirmed, sequela: Secondary | ICD-10-CM

## 2019-04-29 NOTE — Progress Notes (Signed)
Patient ID: Tammy Wilkinson, female   DOB: [birth year], 33 y.o.   MRN: [id number] Pt scheduled for IOP Provider FU but failed to come to group

## 2019-04-30 ENCOUNTER — Other Ambulatory Visit: Payer: Self-pay

## 2019-04-30 ENCOUNTER — Ambulatory Visit (INDEPENDENT_AMBULATORY_CARE_PROVIDER_SITE_OTHER): Payer: BLUE CROSS/BLUE SHIELD | Admitting: Licensed Clinical Social Worker

## 2019-04-30 DIAGNOSIS — F32A Depression, unspecified: Secondary | ICD-10-CM

## 2019-04-30 DIAGNOSIS — F1220 Cannabis dependence, uncomplicated: Secondary | ICD-10-CM

## 2019-04-30 DIAGNOSIS — F329 Major depressive disorder, single episode, unspecified: Secondary | ICD-10-CM

## 2019-04-30 DIAGNOSIS — F1110 Opioid abuse, uncomplicated: Secondary | ICD-10-CM

## 2019-04-30 DIAGNOSIS — F419 Anxiety disorder, unspecified: Secondary | ICD-10-CM

## 2019-04-30 DIAGNOSIS — F1020 Alcohol dependence, uncomplicated: Secondary | ICD-10-CM

## 2019-04-30 NOTE — Progress Notes (Signed)
Virtual Visit via Telephone Note  I connected with Tammy Wilkinson on 40/98/11 at 1:15 by telephone and verified that I am speaking with the correct person using two identifiers.   I discussed the limitations, risks, security and privacy concerns of performing an evaluation and management service by telephone and the availability of in person appointments. I also discussed with the patient that there may be a patient responsible charge related to this service. The patient expressed understanding and agreed to proceed.  I discussed the assessment and treatment plan with the patient. The patient was provided an opportunity to ask questions and all were answered. The patient agreed with the plan and demonstrated an understanding of the instructions.   The patient was advised to call back or seek an in-person evaluation if the symptoms worsen or if the condition fails to improve as anticipated.  I provided 25 minutes of non-face-to-face time during this encounter.   [name], LCSW    THERAPIST PROGRESS NOTE  Session Time: 1:15-1:40  Participation Level: Active  Behavioral Response: NAAlertAnxious and Dysphoric  Type of Therapy: Individual Therapy in conjunction with CDIOP  Treatment Goals addressed: Coping and Diagnosis: Achieve and maintain sobriety 7/7 days per week. Utilize healthy coping skills to manage mood symtpoms at least 1xdaily at least days per week  Interventions: Motivational Interviewing and Supportive  Summary: Tammy Wilkinson is a 33 y.o. female who presents with with polysubstance use disorder and diagnosed with bipolar disorder. Client endorses cannabis use Friday (04/26/19) and opioid use on 04/29/19 due to back pain; client reports 'I was hurt and I talked myself into it but it wasn't okay.' Client reports she got it from a different person than previous use. Client has not attended any meetings recently and is in agreement that she could possibly find a  support system there since she reported she did not have anyone to reach out to and discuss thoughts or feelings of cravings currently. Client remains interested in suboxone as this was previously helpful and believes this will help minimize cravings. Client processed with clinician 'never thought I would be addicted to pills after alcohol' and shared self judgement and shame related to ongoing use. Client is able to identify progress made, such as identifying recovery thoughts, even though acting on addictive thoughts.  Suicidal/Homicidal: Nowithout intent/plan  Therapist Response: Clinician contacted client via telehealth due to pandemic and maintaining safety concerns. Clinician inquired about SI/HI/psychosis and overall level of functioning. Clinician inquired about recent stressors and skills attempted to manage mood symptoms. Clinician developed discrepancies with client between previously 'taking using off the table' and 'talked myself into it being okay.'  Clinician reminded client of online NA meetings and brain stormed ways with client to not forget meetings as well as ways to build her support system.  Plan: Return again in 1 weeks.  Diagnosis: Axis I: Bipolar, mixed and Substance Abuse       [name], LCSW 04/30/2019

## 2019-05-01 ENCOUNTER — Other Ambulatory Visit (HOSPITAL_COMMUNITY): Payer: BLUE CROSS/BLUE SHIELD | Admitting: Licensed Clinical Social Worker

## 2019-05-01 ENCOUNTER — Other Ambulatory Visit: Payer: Self-pay

## 2019-05-01 ENCOUNTER — Encounter (HOSPITAL_COMMUNITY): Payer: Self-pay | Admitting: Medical

## 2019-05-01 DIAGNOSIS — F1110 Opioid abuse, uncomplicated: Secondary | ICD-10-CM

## 2019-05-01 DIAGNOSIS — T7491XS Unspecified adult maltreatment, confirmed, sequela: Secondary | ICD-10-CM

## 2019-05-01 DIAGNOSIS — F1020 Alcohol dependence, uncomplicated: Secondary | ICD-10-CM

## 2019-05-01 DIAGNOSIS — F1220 Cannabis dependence, uncomplicated: Secondary | ICD-10-CM

## 2019-05-01 DIAGNOSIS — F329 Major depressive disorder, single episode, unspecified: Secondary | ICD-10-CM

## 2019-05-01 DIAGNOSIS — F3131 Bipolar disorder, current episode depressed, mild: Secondary | ICD-10-CM

## 2019-05-01 DIAGNOSIS — F4312 Post-traumatic stress disorder, chronic: Secondary | ICD-10-CM

## 2019-05-01 DIAGNOSIS — T7492XS Unspecified child maltreatment, confirmed, sequela: Secondary | ICD-10-CM

## 2019-05-01 DIAGNOSIS — Z6372 Alcoholism and drug addiction in family: Secondary | ICD-10-CM

## 2019-05-01 DIAGNOSIS — F411 Generalized anxiety disorder: Secondary | ICD-10-CM

## 2019-05-01 DIAGNOSIS — T7422XS Child sexual abuse, confirmed, sequela: Secondary | ICD-10-CM

## 2019-05-01 DIAGNOSIS — F419 Anxiety disorder, unspecified: Secondary | ICD-10-CM

## 2019-05-01 DIAGNOSIS — F10982 Alcohol use, unspecified with alcohol-induced sleep disorder: Secondary | ICD-10-CM

## 2019-05-01 DIAGNOSIS — F14288 Cocaine dependence with other cocaine-induced disorder: Secondary | ICD-10-CM
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
## 2019-05-02 ENCOUNTER — Other Ambulatory Visit (HOSPITAL_COMMUNITY): Payer: BLUE CROSS/BLUE SHIELD | Admitting: Licensed Clinical Social Worker

## 2019-05-02 DIAGNOSIS — F1110 Opioid abuse, uncomplicated: Secondary | ICD-10-CM

## 2019-05-02 DIAGNOSIS — F419 Anxiety disorder, unspecified: Secondary | ICD-10-CM

## 2019-05-02 DIAGNOSIS — F1020 Alcohol dependence, uncomplicated: Secondary | ICD-10-CM

## 2019-05-02 DIAGNOSIS — F329 Major depressive disorder, single episode, unspecified: Secondary | ICD-10-CM

## 2019-05-02 DIAGNOSIS — F1220 Cannabis dependence, uncomplicated: Secondary | ICD-10-CM

## 2019-05-02 NOTE — Progress Notes (Signed)
    Daily Group Progress Note  Program: CD-IOP   Group Time: 1pm-2:30pm  Participation Level: Active  Behavioral Response: Appropriate  Type of Therapy: Process Group  Topic: Clinician met with clients assessing for SI/HI/psychosis and overall level of functioning. Clinician and group members processed recent successes and barriers to recovery process. Clinician and group members processed struggles with changing thought patterns and behaviors. Clinician reminded clients the first step to recovery is always to 'not use' and that behavior will proceed any thought pattern changes. Clinician and group members processed negative self-talk and the effects on self-esteem and behaviors. Clinician utilized 'Loaded Questions' to engage clients in practicing vulnerability without judgement.   Group Time: 2:30pm-4pm  Participation Level: Active  Behavioral Response: Appropriate  Type of Therapy: Psycho-education Group  Topic: Clinician provided psycho-educational session on self-compassion. Clinician and group members participated in [place]. Clinician presented video from Marilynn Latino on self-compassion. Clinician and group members discussed ways to increase self-compassion as a response to self-judgment.    Summary: Client reports sobriety date of 04/30/19 with that being for last use of opioids, the previous weekend last use of cannabis, and more than 30 days without alcohol. Client shared thought process related to behavior of using and steps of relapse prior to use. Client shared the aspect of life she is delusional about is the severity of her addiction, as well as the likelihood of family re-unification,which was initial motivation for sobriety. Client reports this relates to her harsh self judgement. Client processed with group members difficulty of breaking the thought pattern of need vs want as more difficult than physical withdrawal symptoms.  See PA-C note for  additional details related to suboxone referral at client request.    Family Program: Family present? No   Name of family member(s): NA  UDS collected: Yes Results: previous screen positive for kratom, opioids, and THC  AA/NA attended?: Yes, 1  Sponsor?: No   [name], LCSW
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
## 2019-05-06 ENCOUNTER — Other Ambulatory Visit: Payer: Self-pay

## 2019-05-06 ENCOUNTER — Other Ambulatory Visit (HOSPITAL_COMMUNITY): Payer: BLUE CROSS/BLUE SHIELD | Admitting: Licensed Clinical Social Worker

## 2019-05-06 ENCOUNTER — Encounter (HOSPITAL_COMMUNITY): Payer: Self-pay | Admitting: Medical

## 2019-05-06 DIAGNOSIS — T7491XS Unspecified adult maltreatment, confirmed, sequela: Secondary | ICD-10-CM

## 2019-05-06 DIAGNOSIS — T7492XS Unspecified child maltreatment, confirmed, sequela: Secondary | ICD-10-CM

## 2019-05-06 DIAGNOSIS — F329 Major depressive disorder, single episode, unspecified: Secondary | ICD-10-CM

## 2019-05-06 DIAGNOSIS — F1110 Opioid abuse, uncomplicated: Secondary | ICD-10-CM

## 2019-05-06 DIAGNOSIS — T7422XS Child sexual abuse, confirmed, sequela: Secondary | ICD-10-CM

## 2019-05-06 DIAGNOSIS — F14288 Cocaine dependence with other cocaine-induced disorder: Secondary | ICD-10-CM

## 2019-05-06 DIAGNOSIS — F1220 Cannabis dependence, uncomplicated: Secondary | ICD-10-CM

## 2019-05-06 DIAGNOSIS — F4312 Post-traumatic stress disorder, chronic: Secondary | ICD-10-CM

## 2019-05-06 DIAGNOSIS — F1020 Alcohol dependence, uncomplicated: Secondary | ICD-10-CM

## 2019-05-06 DIAGNOSIS — Z6372 Alcoholism and drug addiction in family: Secondary | ICD-10-CM

## 2019-05-06 DIAGNOSIS — F419 Anxiety disorder, unspecified: Secondary | ICD-10-CM
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
## 2019-05-07 ENCOUNTER — Ambulatory Visit (HOSPITAL_COMMUNITY): Payer: BLUE CROSS/BLUE SHIELD | Admitting: Licensed Clinical Social Worker

## 2019-05-07 ENCOUNTER — Other Ambulatory Visit: Payer: Self-pay

## 2019-05-08 ENCOUNTER — Other Ambulatory Visit (HOSPITAL_COMMUNITY): Payer: Self-pay | Admitting: Medical

## 2019-05-08 ENCOUNTER — Other Ambulatory Visit: Payer: Self-pay

## 2019-05-08 ENCOUNTER — Other Ambulatory Visit (HOSPITAL_COMMUNITY): Payer: BLUE CROSS/BLUE SHIELD | Admitting: Licensed Clinical Social Worker

## 2019-05-08 DIAGNOSIS — T7492XS Unspecified child maltreatment, confirmed, sequela: Secondary | ICD-10-CM

## 2019-05-08 DIAGNOSIS — F14288 Cocaine dependence with other cocaine-induced disorder: Secondary | ICD-10-CM

## 2019-05-08 DIAGNOSIS — F319 Bipolar disorder, unspecified: Secondary | ICD-10-CM

## 2019-05-08 DIAGNOSIS — F1110 Opioid abuse, uncomplicated: Secondary | ICD-10-CM | POA: Diagnosis not present

## 2019-05-08 DIAGNOSIS — F1020 Alcohol dependence, uncomplicated: Secondary | ICD-10-CM

## 2019-05-08 DIAGNOSIS — F1199 Opioid use, unspecified with unspecified opioid-induced disorder: Secondary | ICD-10-CM

## 2019-05-08 DIAGNOSIS — F3131 Bipolar disorder, current episode depressed, mild: Secondary | ICD-10-CM

## 2019-05-08 DIAGNOSIS — Z6372 Alcoholism and drug addiction in family: Secondary | ICD-10-CM

## 2019-05-08 DIAGNOSIS — F1220 Cannabis dependence, uncomplicated: Secondary | ICD-10-CM

## 2019-05-08 DIAGNOSIS — F4312 Post-traumatic stress disorder, chronic: Secondary | ICD-10-CM

## 2019-05-08 DIAGNOSIS — F1190 Opioid use, unspecified, uncomplicated: Secondary | ICD-10-CM

## 2019-05-08 MED ORDER — TRAZODONE HCL 100 MG PO TABS: 100.0000 mg | ORAL_TABLET | Freq: Every day | ORAL | 0 refills | Status: DC

## 2019-05-08 MED ORDER — LAMOTRIGINE 150 MG PO TABS: 150.0000 mg | ORAL_TABLET | Freq: Two times a day (BID) | ORAL | 0 refills | Status: DC

## 2019-05-08 MED ORDER — VENLAFAXINE HCL ER 150 MG PO CP24: 150.0000 mg | ORAL_CAPSULE | Freq: Every day | ORAL | 0 refills | Status: DC

## 2019-05-08 MED ORDER — ARIPIPRAZOLE 5 MG PO TABS: 5.0000 mg | ORAL_TABLET | Freq: Every day | ORAL | 0 refills | Status: DC

## 2019-05-08 NOTE — Progress Notes (Signed)
Rx from Dr Adele Schilder refilled

## 2019-05-09 ENCOUNTER — Other Ambulatory Visit: Payer: Self-pay

## 2019-05-09 ENCOUNTER — Other Ambulatory Visit (HOSPITAL_COMMUNITY): Payer: BLUE CROSS/BLUE SHIELD

## 2019-05-13 ENCOUNTER — Other Ambulatory Visit (HOSPITAL_COMMUNITY): Payer: BLUE CROSS/BLUE SHIELD

## 2019-05-13 ENCOUNTER — Other Ambulatory Visit: Payer: Self-pay

## 2019-05-14 ENCOUNTER — Telehealth (HOSPITAL_COMMUNITY): Payer: Self-pay | Admitting: Licensed Clinical Social Worker

## 2019-05-14 ENCOUNTER — Ambulatory Visit (HOSPITAL_COMMUNITY): Payer: BLUE CROSS/BLUE SHIELD | Admitting: Licensed Clinical Social Worker

## 2019-05-14 ENCOUNTER — Other Ambulatory Visit: Payer: Self-pay

## 2019-05-14 NOTE — Progress Notes (Signed)
    Daily Group Progress Note  Program: CD-IOP   Group Time: 1pm-2:30pm  Participation Level: Active  Behavioral Response: Appropriate  Type of Therapy: Process Group  Topic: Clinician checked in with clients, assessing for SI/HI/psychosis and overall level of functioning including substance use and difficulty maintaining sobriety. Clinician inquired about community meetings attended and skills attempted since last session. Clinician and group members processed successes and challenges they experienced over the weekend. Clinician and group members read and discussed Daily Reflection from AA and NA quote of the day and discussed relevance in current stage of recovery.  Group Time: 2:30pm-4pm  Participation Level: Active  Behavioral Response: Appropriate  Type of Therapy: Psycho-education Group  Topic: Clinician provided clients with progressive muscle relaxation activity in session for mindfulness practice. Clinician presented the topic of Distress Tolerance Crisis Skills. Clinician and group reviewed STOP and TIPP skills. Clinician and group reviewed Distracting skill of ACCEPTS. Clinician and group members practiced 5 senses still in session.   Summary: See PA note for additional details. Client engaged in group discussions and activities, identifying recent stressors related to family and moving. Reports sobriety date initially as 10/13 however admitted to struggling over the weekend. Client is receptive to TIPP skill and participates in coping skills in session however reports minimal use outside of sessions. Client states will try 5 sense activity with daughter. Client rpeorts being around her children is helping keep her from using, in addition to the desired referral to suboxone pending negative UDS for opioids and continued decreasing cannabis levels    Family Program: Family present? No   UDS collected: Yes Results: positive for [facility]  AA/NA attended?: Yes 2 mtgs since last  group  Sponsor?: No   [name], LCSW

## 2019-05-15 ENCOUNTER — Other Ambulatory Visit (HOSPITAL_COMMUNITY): Payer: Self-pay | Admitting: Medical

## 2019-05-15 ENCOUNTER — Telehealth (HOSPITAL_COMMUNITY): Payer: Self-pay

## 2019-05-15 ENCOUNTER — Other Ambulatory Visit (HOSPITAL_COMMUNITY): Payer: BLUE CROSS/BLUE SHIELD

## 2019-05-15 ENCOUNTER — Other Ambulatory Visit: Payer: Self-pay

## 2019-05-15 DIAGNOSIS — F1010 Alcohol abuse, uncomplicated: Secondary | ICD-10-CM

## 2019-05-15 DIAGNOSIS — F1110 Opioid abuse, uncomplicated: Secondary | ICD-10-CM | POA: Insufficient documentation

## 2019-05-15 MED ORDER — NALTREXONE HCL 50 MG PO TABS: 50.0000 mg | ORAL_TABLET | Freq: Every day | ORAL | 0 refills | Status: DC

## 2019-05-15 MED ORDER — BACLOFEN 10 MG PO TABS: 10.0000 mg | ORAL_TABLET | Freq: Three times a day (TID) | ORAL | 1 refills | Status: DC

## 2019-05-15 NOTE — Progress Notes (Signed)
Phone call requesting refills of Naltrexone and Baclofen be sent to [facility] in [place]

## 2019-05-15 NOTE — Progress Notes (Signed)
    Daily Group Progress Note  Program: CD-IOP   Group Time: 1pm-2:30pm  Participation Level: Active  Behavioral Response: Appropriate  Type of Therapy: Process Group  Topic: Clinician checked in with group members, assessing for SI/HI/psychosis and overall level of functioning including successes and barriers to recovery. Clinician and group members read and processed Daily reflections and application to current place in recovery. Clinician and group member processed finding home and self-respect through gratitude and recovery. Clinician and group members shared personal quotes as homework from previous days discussions.   Group Time: 2:30pm-4pm  Participation Level: Active  Behavioral Response: Appropriate  Type of Therapy: Psycho-education Group  Topic: Clinician provided psycho-education on the benefits of mindfulness in recovery. Clinician and group members discussed changing thought patterns from suffering, to creating home and changes in perspective. Clinician utilized Happy Brain: Predisposition to Suffering as supplemental material. Clinician and group members utilized SMART goal creation to focus on building new habits and strengthening 'recovery thinking' pathways.   Summary: Client engages in group discussion reporting mindfulness activity is still difficult sometimes but she is more easily able to redirect herself. Client is able to identify moments of gratitude, even in recent difficult situations, and is especially grateful for her children. Client created a goal to build habits supporting her sobriety and was encouraged to built outside support system for after group. Client shows progress toward goals AEB reported sobriety date of 04/30/19, continues challenges with responding to internal triggers.   Family Program: Family present? No   Name of family member(s): NA  UDS collected: No Results: from 05/06/19, positive for [facility] only AA/NA attended?: YES 2 meetings  since Monday therapy.  Sponsor?: No   [name], LCSW

## 2019-05-15 NOTE — Telephone Encounter (Signed)
Patient called requesting a refill on her Baclofen 10mg  and her Naltrexone 50mg  to be sent to [facility] on [address] in [place]. Thank you.

## 2019-05-15 NOTE — Telephone Encounter (Signed)
sent 

## 2019-05-16 ENCOUNTER — Other Ambulatory Visit: Payer: Self-pay

## 2019-05-16 ENCOUNTER — Other Ambulatory Visit (HOSPITAL_COMMUNITY): Payer: BLUE CROSS/BLUE SHIELD

## 2019-05-20 ENCOUNTER — Other Ambulatory Visit: Payer: Self-pay

## 2019-05-20 ENCOUNTER — Other Ambulatory Visit (HOSPITAL_COMMUNITY): Payer: BLUE CROSS/BLUE SHIELD | Attending: Psychiatry | Admitting: Licensed Clinical Social Worker

## 2019-05-20 DIAGNOSIS — Z791 Long term (current) use of non-steroidal anti-inflammatories (NSAID): Secondary | ICD-10-CM | POA: Insufficient documentation

## 2019-05-20 DIAGNOSIS — F1420 Cocaine dependence, uncomplicated: Secondary | ICD-10-CM | POA: Insufficient documentation

## 2019-05-20 DIAGNOSIS — F1020 Alcohol dependence, uncomplicated: Secondary | ICD-10-CM | POA: Insufficient documentation

## 2019-05-20 DIAGNOSIS — F1220 Cannabis dependence, uncomplicated: Secondary | ICD-10-CM | POA: Diagnosis not present

## 2019-05-20 DIAGNOSIS — Z79899 Other long term (current) drug therapy: Secondary | ICD-10-CM | POA: Insufficient documentation

## 2019-05-20 DIAGNOSIS — F1120 Opioid dependence, uncomplicated: Secondary | ICD-10-CM | POA: Diagnosis present

## 2019-05-20 DIAGNOSIS — G4700 Insomnia, unspecified: Secondary | ICD-10-CM | POA: Diagnosis not present

## 2019-05-20 DIAGNOSIS — T7411XS Adult physical abuse, confirmed, sequela: Secondary | ICD-10-CM | POA: Diagnosis not present

## 2019-05-20 DIAGNOSIS — Z62819 Personal history of unspecified abuse in childhood: Secondary | ICD-10-CM | POA: Diagnosis not present

## 2019-05-20 DIAGNOSIS — F319 Bipolar disorder, unspecified: Secondary | ICD-10-CM | POA: Diagnosis not present

## 2019-05-20 DIAGNOSIS — F4312 Post-traumatic stress disorder, chronic: Secondary | ICD-10-CM | POA: Insufficient documentation

## 2019-05-20 DIAGNOSIS — Z792 Long term (current) use of antibiotics: Secondary | ICD-10-CM | POA: Diagnosis not present

## 2019-05-20 DIAGNOSIS — F419 Anxiety disorder, unspecified: Secondary | ICD-10-CM

## 2019-05-20 NOTE — Telephone Encounter (Signed)
Had notified patient 

## 2019-05-21 NOTE — Progress Notes (Signed)
    Daily Group Progress Note  Program: CD-IOP   Group Time: 1pm-2:30pm Participation Level: Active Behavioral Response: Appropriate Type of Therapy: Process Group  Topic:  Clinician checked in with group members, assessing for SI/HI/psychosis and overall level of functioning including strengths and barriers to recovery. Clinician and group members read Daily Reflection and discussed how it applies to current moment in recovery. Clinician provided mindfulness grounding activity for clients to participate.   Group Time: 2:30-4pm Participation Level: Active Behavioral Response: Appropriate  Type of Therapy: Psycho-education Group  Topic:  Clinician presented the topic of Co-dependency. Clinician and group members traits and how this applies to current space in recovery and how previously supported addition. Clinician challenged group members to identify where there are not healthy boundaries and how this is effecting co-dependent behaviors. Clinician and group members reviewed healthy boundaries and thinking and role played addressing behaviors with family members.  Summary: 04/22/19 sobriety date. Client identified differences in relationships between father, mother, and ex-husband. Client is able to link distorted thinking styles with co-dependent behaviors. Client reports relapse due to stress related to relationship and shows progress AEB identifying thought pattern prior to use however struggles to maintain sobriety. Clinician and client discussed why substances are still being considered an option for use to manage symptoms, especially since some used are not currently in the home.   Family Program: Family present? No   Name of family member(s): NA  UDS collected: Yes Results:  Reports relapse on opioids  AA/NA attended?: Yes following use.  Sponsor?: No   [name], LCSW

## 2019-05-21 NOTE — Progress Notes (Signed)
    Daily Group Progress Note  Program: CD-IOP   Group Time: 1pm-2:30pm Participation Level: Active Behavioral Response: Appropriate Type of Therapy: Process Group  Topic: Clinician met with clients, assessing for SI/HI/psychosis and substance use. Clinician inquired about recent stressors or barriers to sobriety. Clinician and group members read and discussed Daily Reflection and how it applies to current place in recovery. Clinician and group members processed identifying pros and cons of sobriety and addiction.   Group Time: 2:30pm-4pm Participation Level: Active Behavioral Response: Appropriate Type of Therapy: Psycho-education Group  Topic: Clinician provided clients with mindfulness activity to help with emotional regulation. Clinician and group members worked on identifying primary and secondary feelings and the benefit of identifying feelings for use in I-statements to get needs met. Clinician and group members utilized [place] activity to focus on identifying feelings related to self-esteem and practice answering questions with lack of self-judgement.    Summary: Client reports sobriety date of 04/23/2019. Client is receptive to feedback from group members and clinician related to stressors and focusing on managing what is in her control. Client shows progress toward goals AEB identifying stuck points vs areas of control however continues to struggle managing overwhelming emotions. Client reports not attempting skills outside of group on a regular basis to gain mastery. Client engaged in group discussion sharing how recent life experiences have effected her self esteem and judgement of self.   Family Program: Family present? No   Name of family member(s): NA  UDS collected: Yes   AA/NA attended?: No  Sponsor?: No    A , LCSW        

## 2019-05-22 ENCOUNTER — Encounter (HOSPITAL_COMMUNITY): Payer: BLUE CROSS/BLUE SHIELD

## 2019-05-23 ENCOUNTER — Other Ambulatory Visit (HOSPITAL_COMMUNITY): Payer: BLUE CROSS/BLUE SHIELD

## 2019-05-23 ENCOUNTER — Other Ambulatory Visit: Payer: Self-pay

## 2019-05-27 ENCOUNTER — Other Ambulatory Visit: Payer: Self-pay

## 2019-05-27 ENCOUNTER — Other Ambulatory Visit (HOSPITAL_COMMUNITY): Payer: BLUE CROSS/BLUE SHIELD | Admitting: Licensed Clinical Social Worker

## 2019-05-27 DIAGNOSIS — F4312 Post-traumatic stress disorder, chronic: Secondary | ICD-10-CM

## 2019-05-27 DIAGNOSIS — F1020 Alcohol dependence, uncomplicated: Secondary | ICD-10-CM

## 2019-05-27 DIAGNOSIS — F1120 Opioid dependence, uncomplicated: Secondary | ICD-10-CM

## 2019-05-27 DIAGNOSIS — F319 Bipolar disorder, unspecified: Secondary | ICD-10-CM

## 2019-05-28 NOTE — Progress Notes (Signed)
    Daily Group Progress Note  Program: CDIOP  Group Time: 1pm-2:30pm  Participation Level: Active  Behavioral Response: Appropriate  Type of Therapy: Psycho-education Group  Topic:Clinician met with group members, assessing for SI/HI/psychosis and overall level of functioning including engagement in community support programs and difficulty maintaining sobriety. Clinician and group members processed strengths and struggles over the weekend. Clinician and group members read and processed AA and NA daily reflections.   Psycho-educational group facilitated by LPC focused on mindfulness by utilizing gentle yoga and breathing meditations.    Group Time: 2:30pm-4pm  Participation Level: Active  Behavioral Response: Appropriate  Type of Therapy: Process Group  Topic:  Clinician and group members processed changes in self and connection with others based on stage in recovery or relapse. Clinician and group members created personal obituaries and processed how these could be different based on continued use or sobriety. Clinician and group members celebrated graduation of group members reviewing progress and well wishes.   Summary: Client sobriety date of 05/15/19 reporting attending 'quite a few' meetings following use of pain medication. Client shows progress toward goal AEB maintaining abstinence from alcohol however continues to struggle with ongoing use of opioids. Client current insurance does not cover MAT which she believes will be necessary to stop opioids. Client reports taking medication after dental appointment previous week. Client states she is able to identify after use the negative effects and feeling guilty however does continue use. Due to continued use and insurance limitations client will not receive a referral directly for suboxone at this time. Client engaged in group activity of yoga, and shared how the death of her sister effected how she processed personal obituary  content.   Family Program: Family present? No   Name of family member(s): NA  UDS collected: No Results: self report of opioid use  AA/NA attended?: Yes 'quite a few meetings' after use  Sponsor?: No    A , LCSW        

## 2019-05-29 ENCOUNTER — Other Ambulatory Visit: Payer: Self-pay

## 2019-05-29 ENCOUNTER — Encounter (HOSPITAL_COMMUNITY): Payer: Self-pay | Admitting: Medical

## 2019-05-29 ENCOUNTER — Other Ambulatory Visit (INDEPENDENT_AMBULATORY_CARE_PROVIDER_SITE_OTHER): Payer: BLUE CROSS/BLUE SHIELD | Admitting: Medical

## 2019-05-29 DIAGNOSIS — T7491XS Unspecified adult maltreatment, confirmed, sequela: Secondary | ICD-10-CM

## 2019-05-29 DIAGNOSIS — F419 Anxiety disorder, unspecified: Secondary | ICD-10-CM

## 2019-05-29 DIAGNOSIS — F1220 Cannabis dependence, uncomplicated: Secondary | ICD-10-CM

## 2019-05-29 DIAGNOSIS — F319 Bipolar disorder, unspecified: Secondary | ICD-10-CM

## 2019-05-29 DIAGNOSIS — F1020 Alcohol dependence, uncomplicated: Secondary | ICD-10-CM

## 2019-05-29 DIAGNOSIS — F14288 Cocaine dependence with other cocaine-induced disorder: Secondary | ICD-10-CM

## 2019-05-29 DIAGNOSIS — F10982 Alcohol use, unspecified with alcohol-induced sleep disorder: Secondary | ICD-10-CM

## 2019-05-29 DIAGNOSIS — F1120 Opioid dependence, uncomplicated: Secondary | ICD-10-CM

## 2019-05-29 DIAGNOSIS — F329 Major depressive disorder, single episode, unspecified: Secondary | ICD-10-CM

## 2019-05-29 DIAGNOSIS — T7492XS Unspecified child maltreatment, confirmed, sequela: Secondary | ICD-10-CM

## 2019-05-29 DIAGNOSIS — F4312 Post-traumatic stress disorder, chronic: Secondary | ICD-10-CM

## 2019-05-29 MED ORDER — CLONIDINE HCL 0.1 MG PO TABS: 0.1000 mg | ORAL_TABLET | Freq: Two times a day (BID) | ORAL | 11 refills | Status: DC
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
## 2019-05-30 ENCOUNTER — Other Ambulatory Visit: Payer: Self-pay

## 2019-05-30 ENCOUNTER — Other Ambulatory Visit (HOSPITAL_COMMUNITY): Payer: BLUE CROSS/BLUE SHIELD

## 2019-06-03 ENCOUNTER — Other Ambulatory Visit: Payer: Self-pay

## 2019-06-03 ENCOUNTER — Other Ambulatory Visit (HOSPITAL_COMMUNITY): Payer: BLUE CROSS/BLUE SHIELD | Admitting: Licensed Clinical Social Worker

## 2019-06-03 ENCOUNTER — Other Ambulatory Visit (HOSPITAL_COMMUNITY): Payer: Self-pay | Admitting: Medical

## 2019-06-03 DIAGNOSIS — F1120 Opioid dependence, uncomplicated: Secondary | ICD-10-CM | POA: Diagnosis not present

## 2019-06-03 DIAGNOSIS — F32A Depression, unspecified: Secondary | ICD-10-CM

## 2019-06-03 DIAGNOSIS — F1199 Opioid use, unspecified with unspecified opioid-induced disorder: Secondary | ICD-10-CM

## 2019-06-03 DIAGNOSIS — F1020 Alcohol dependence, uncomplicated: Secondary | ICD-10-CM

## 2019-06-03 DIAGNOSIS — F1190 Opioid use, unspecified, uncomplicated: Secondary | ICD-10-CM

## 2019-06-03 DIAGNOSIS — F329 Major depressive disorder, single episode, unspecified: Secondary | ICD-10-CM

## 2019-06-03 DIAGNOSIS — F3131 Bipolar disorder, current episode depressed, mild: Secondary | ICD-10-CM

## 2019-06-03 NOTE — Progress Notes (Signed)
    Daily Group Progress Note  Program: CD-IOP   Group Time: 1pm-2pm  Participation Level: Active  Behavioral Response: Appropriate  Type of Therapy: Process Group  Topic: Clinician checked in with group members, assessing for SI/HI/psychosis and overall level of functioning including relapse and barriers to recovery. Clinician and group members discussed highlights and struggles since last group related to maintaining sobriety including identified triggers and responses. Clinician and group members processed Daily Reflection and  personal meeting to current work in recovery. Clinician provided mindfulness activity.   Group Time: 2pm-4pm  Participation Level: Active  Behavioral Response: Appropriate  Type of Therapy: Psycho-education Group  Topic: Clinician and group members reviewed accomplishments and needs, including physical, emotional, cognitive, and social needs. Clinician and group members discussed what it looks like to have needs met, and what it looks like when their personal needs are not met. Clinician facilitated discussion on self-awareness of feelings, unmet needs, and fears related to asking for help. Clients provided homework of completing My Needs Instructional Guide to discuss following group.    Summary: Client sobriety date of 05/26/19. Client processed use of opioids. Client identified needs not currently being met and how this can be addressed. Client shared worries with asking for help previously. Client shows progress toward goals AEB attending community meetings and attending group therapy sober. Client applied for medicaid in order to be able and engage in MAT services following IOP.    Family Program: Family present? No   Name of family member(s): NA  UDS collected: Yes Results: from 05/24/19; cannabis and opioids; both reported by client  AA/NA attended?: Yes  Sponsor?: No   [name], LCSW

## 2019-06-04 ENCOUNTER — Ambulatory Visit (HOSPITAL_COMMUNITY): Payer: BLUE CROSS/BLUE SHIELD | Admitting: Licensed Clinical Social Worker

## 2019-06-05 ENCOUNTER — Encounter (HOSPITAL_COMMUNITY): Payer: BLUE CROSS/BLUE SHIELD

## 2019-06-06 ENCOUNTER — Other Ambulatory Visit (HOSPITAL_COMMUNITY): Payer: BLUE CROSS/BLUE SHIELD

## 2019-06-06 ENCOUNTER — Other Ambulatory Visit: Payer: Self-pay

## 2019-06-06 ENCOUNTER — Telehealth (HOSPITAL_COMMUNITY): Payer: Self-pay | Admitting: Licensed Clinical Social Worker

## 2019-06-06 ENCOUNTER — Other Ambulatory Visit (HOSPITAL_COMMUNITY): Payer: Self-pay | Admitting: Medical

## 2019-06-06 DIAGNOSIS — F1010 Alcohol abuse, uncomplicated: Secondary | ICD-10-CM

## 2019-06-06 DIAGNOSIS — F411 Generalized anxiety disorder: Secondary | ICD-10-CM

## 2019-06-06 DIAGNOSIS — F3131 Bipolar disorder, current episode depressed, mild: Secondary | ICD-10-CM

## 2019-06-06 MED ORDER — NALTREXONE HCL 50 MG PO TABS: 50.0000 mg | ORAL_TABLET | Freq: Every day | ORAL | 0 refills | Status: DC

## 2019-06-06 MED ORDER — ARIPIPRAZOLE 5 MG PO TABS: 5.0000 mg | ORAL_TABLET | Freq: Every day | ORAL | 0 refills | Status: DC

## 2019-06-06 MED ORDER — HYDROXYZINE PAMOATE 25 MG PO CAPS: 25.0000 mg | ORAL_CAPSULE | Freq: Two times a day (BID) | ORAL | 0 refills | Status: DC | PRN

## 2019-06-06 MED ORDER — LAMOTRIGINE 150 MG PO TABS: 150.0000 mg | ORAL_TABLET | Freq: Two times a day (BID) | ORAL | 0 refills | Status: DC

## 2019-06-10 ENCOUNTER — Telehealth (HOSPITAL_COMMUNITY): Payer: Self-pay | Admitting: *Deleted

## 2019-06-10 ENCOUNTER — Encounter (HOSPITAL_COMMUNITY): Payer: BLUE CROSS/BLUE SHIELD

## 2019-06-10 ENCOUNTER — Telehealth (HOSPITAL_COMMUNITY): Payer: Self-pay | Admitting: Licensed Clinical Social Worker

## 2019-06-10 NOTE — Telephone Encounter (Signed)
Pt left message requesting refills on Trazadone, Lyrica, and Effexor. See is scheduled for CDIOP today at 1300.

## 2019-06-11 NOTE — Progress Notes (Signed)
    Daily Group Progress Note  Program: CD-IOP   Group Time: 1pm-2pm Participation Level: Active Behavioral Response: Appropriate Type of Therapy: Psycho-education Group  Topic: Clinician checked in with group members, assessing for SI/HI/psychosis and overall level of functioning, including cravings or relapse. Psycho-educational group co-facilitated by pharmacist. Clients were provided time for questions and pharmacist provided education on types of medications.   Group Time: 2pm-4pm Participation Level: Active Behavioral Response: Appropriate Type of Therapy: Process Group  Topic: Process group included clinician presented the topic of assertive communication. Clinician and group members discussed traits of passive, aggressive, and assertive communication. Clinician and group members reviewed Interpersonal Effectiveness skills and role played scenarios in session. Clinician and group members discussed possibilities or relationship changes based on use of assertive communication to maintain healthy boundaries.   Summary: Client utilized time with pharmacist to inquire about craving medications and MAT. Client engaged in role play with group members and provided feedback. Client verbalized sometimes using passive communication to avoid confrontation however has a history of using aggressive communication when intoxicated.   Family Program: Family present? No   Name of family member(s): na  UDS collected: No AA/NA attended?: Yes  Sponsor?: No   [name], LCSW

## 2019-06-12 ENCOUNTER — Encounter (HOSPITAL_COMMUNITY): Payer: Self-pay | Admitting: Medical

## 2019-06-12 ENCOUNTER — Other Ambulatory Visit (HOSPITAL_COMMUNITY): Payer: Self-pay | Admitting: Medical

## 2019-06-12 ENCOUNTER — Other Ambulatory Visit (HOSPITAL_COMMUNITY): Payer: BLUE CROSS/BLUE SHIELD | Admitting: Licensed Clinical Social Worker

## 2019-06-12 DIAGNOSIS — F1199 Opioid use, unspecified with unspecified opioid-induced disorder: Secondary | ICD-10-CM

## 2019-06-12 DIAGNOSIS — F4312 Post-traumatic stress disorder, chronic: Secondary | ICD-10-CM

## 2019-06-12 DIAGNOSIS — F329 Major depressive disorder, single episode, unspecified: Secondary | ICD-10-CM

## 2019-06-12 DIAGNOSIS — Z6372 Alcoholism and drug addiction in family: Secondary | ICD-10-CM

## 2019-06-12 DIAGNOSIS — T7491XS Unspecified adult maltreatment, confirmed, sequela: Secondary | ICD-10-CM

## 2019-06-12 DIAGNOSIS — F1120 Opioid dependence, uncomplicated: Secondary | ICD-10-CM | POA: Diagnosis not present

## 2019-06-12 DIAGNOSIS — F1190 Opioid use, unspecified, uncomplicated: Secondary | ICD-10-CM

## 2019-06-12 DIAGNOSIS — T7492XS Unspecified child maltreatment, confirmed, sequela: Secondary | ICD-10-CM

## 2019-06-12 DIAGNOSIS — F3131 Bipolar disorder, current episode depressed, mild: Secondary | ICD-10-CM

## 2019-06-12 DIAGNOSIS — F14288 Cocaine dependence with other cocaine-induced disorder: Secondary | ICD-10-CM

## 2019-06-12 DIAGNOSIS — F32A Depression, unspecified: Secondary | ICD-10-CM

## 2019-06-12 DIAGNOSIS — F1220 Cannabis dependence, uncomplicated: Secondary | ICD-10-CM

## 2019-06-12 DIAGNOSIS — F1020 Alcohol dependence, uncomplicated: Secondary | ICD-10-CM

## 2019-06-12 MED ORDER — PREGABALIN 150 MG PO CAPS: 150.0000 mg | ORAL_CAPSULE | Freq: Three times a day (TID) | ORAL | 0 refills | Status: DC
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
## 2019-06-17 ENCOUNTER — Encounter (HOSPITAL_COMMUNITY): Payer: BLUE CROSS/BLUE SHIELD

## 2019-06-18 ENCOUNTER — Other Ambulatory Visit (HOSPITAL_COMMUNITY): Payer: Self-pay | Admitting: Medical

## 2019-06-18 DIAGNOSIS — F3131 Bipolar disorder, current episode depressed, mild: Secondary | ICD-10-CM

## 2019-06-18 NOTE — Progress Notes (Signed)
    Daily Group Progress Note  Program: CD-IOP   Group Time: 1pm-2:30pm Participation Level: Active Behavioral Response: Appropriate Type of Therapy: Process Group  Topic: Clinician checked in with clients, assessing for SI/HI/psychosis and overall level of functioning, including sobriety dates and number of support groups attended since last group. Clinician and group members processed highlights and challenges since last group session. Clients processed barriers to sobriety. Clinician and clients read daily reflections for discussion on meditation and spiritual health. Clinician lead progressive muscle relaxation exercise.    Group Time: 2:30pm-4pm Participation Level: Active Behavioral Response: Appropriate Type of Therapy: Psycho-education Group  Topic: Clinician presented the topic of 'Feelings, Thoughts, and Mind Traps' from [place]. Clinician processed with group members different mind traps and role played how to challenge each type of thought. Clinician challenged group members to identify distorted thoughts, the feeling which came with the thought, and create an alternative thoughts. Clinician and group members reviewed steps for challenging mind traps, starting with identifying uncomfortable feeling and related thought and disrupting thoughts with positive self-talk and alternative, helpful thoughts. Clinician presented 'Roadblocks to Healthy Thinking: Ways of Thinking That Keep You Stuck." Clinician challenged group members to identify a time where they were caught in these, or others were caught in them during an interaction. Clinician and group members reviewed 'Developing New Thinking Habits' in response to distorted thinking. Clinician challenged clients to use a skill to make the moment manageable, not perfect and focus on asking 'how' instead of 'why' questions to problem solve rather than 'get stuck' in emotional mind. Clinician and group members practiced STOP  skill.    Summary: Client reports sobriety date of 06/09/19, smoking cannabis to manage mood and cravings when she ran out of medication. Client denies opioid use despite cravings. Client reports interaction with her daughters is helpful for managing use as well as attending meetings. Client continues to verbalize desire to maintain sobriety. Client reports medicaid was declined and she was going to follow up in order to be able to engage with an MAT provider. Client reports other option is to enroll with different provider this year. See PA-C note for additional details.   Family Program: Family present? No   Name of family member(s): NA  UDS collected: No Results: reports cannabis use previous weekend  AA/NA attended?: Yes  Sponsor?: No   [name], LCSW

## 2019-06-19 ENCOUNTER — Telehealth (HOSPITAL_COMMUNITY): Payer: Self-pay | Admitting: Licensed Clinical Social Worker

## 2019-06-19 ENCOUNTER — Encounter (HOSPITAL_COMMUNITY): Payer: BLUE CROSS/BLUE SHIELD

## 2019-06-20 ENCOUNTER — Encounter (HOSPITAL_COMMUNITY): Payer: BLUE CROSS/BLUE SHIELD

## 2019-06-24 ENCOUNTER — Encounter (HOSPITAL_COMMUNITY): Payer: BLUE CROSS/BLUE SHIELD

## 2019-06-26 ENCOUNTER — Encounter (HOSPITAL_COMMUNITY): Payer: Self-pay | Admitting: Medical

## 2019-06-26 ENCOUNTER — Other Ambulatory Visit: Payer: Self-pay

## 2019-06-26 ENCOUNTER — Other Ambulatory Visit (HOSPITAL_COMMUNITY): Payer: BLUE CROSS/BLUE SHIELD | Attending: Psychiatry | Admitting: Licensed Clinical Social Worker

## 2019-06-26 DIAGNOSIS — F3131 Bipolar disorder, current episode depressed, mild: Secondary | ICD-10-CM | POA: Diagnosis present

## 2019-06-26 DIAGNOSIS — F1420 Cocaine dependence, uncomplicated: Secondary | ICD-10-CM | POA: Diagnosis not present

## 2019-06-26 DIAGNOSIS — Z79899 Other long term (current) drug therapy: Secondary | ICD-10-CM | POA: Diagnosis not present

## 2019-06-26 DIAGNOSIS — F4312 Post-traumatic stress disorder, chronic: Secondary | ICD-10-CM | POA: Insufficient documentation

## 2019-06-26 DIAGNOSIS — Z6372 Alcoholism and drug addiction in family: Secondary | ICD-10-CM

## 2019-06-26 DIAGNOSIS — F1190 Opioid use, unspecified, uncomplicated: Secondary | ICD-10-CM | POA: Diagnosis not present

## 2019-06-26 DIAGNOSIS — F14288 Cocaine dependence with other cocaine-induced disorder: Secondary | ICD-10-CM

## 2019-06-26 DIAGNOSIS — F1021 Alcohol dependence, in remission: Secondary | ICD-10-CM | POA: Diagnosis not present

## 2019-06-26 DIAGNOSIS — F1020 Alcohol dependence, uncomplicated: Secondary | ICD-10-CM

## 2019-06-26 DIAGNOSIS — F1220 Cannabis dependence, uncomplicated: Secondary | ICD-10-CM | POA: Diagnosis not present

## 2019-06-26 DIAGNOSIS — T7492XS Unspecified child maltreatment, confirmed, sequela: Secondary | ICD-10-CM

## 2019-06-26 DIAGNOSIS — Z813 Family history of other psychoactive substance abuse and dependence: Secondary | ICD-10-CM | POA: Insufficient documentation

## 2019-06-26 DIAGNOSIS — F1199 Opioid use, unspecified with unspecified opioid-induced disorder: Secondary | ICD-10-CM

## 2019-06-26 DIAGNOSIS — F411 Generalized anxiety disorder: Secondary | ICD-10-CM | POA: Diagnosis not present

## 2019-06-26 MED ORDER — LAMOTRIGINE 150 MG PO TABS: 150.0000 mg | ORAL_TABLET | Freq: Two times a day (BID) | ORAL | 2 refills | Status: DC

## 2019-06-26 MED ORDER — ARIPIPRAZOLE 5 MG PO TABS: 5.0000 mg | ORAL_TABLET | Freq: Every day | ORAL | 0 refills | Status: DC

## 2019-06-26 MED ORDER — NALTREXONE HCL 50 MG PO TABS: 50.0000 mg | ORAL_TABLET | Freq: Every day | ORAL | 0 refills | Status: DC

## 2019-06-26 MED ORDER — TRAZODONE HCL 100 MG PO TABS: ORAL_TABLET | ORAL | 0 refills | Status: DC

## 2019-06-26 MED ORDER — PREGABALIN 150 MG PO CAPS: 150.0000 mg | ORAL_CAPSULE | Freq: Three times a day (TID) | ORAL | 0 refills | Status: DC

## 2019-06-26 MED ORDER — HYDROXYZINE PAMOATE 25 MG PO CAPS: 25.0000 mg | ORAL_CAPSULE | Freq: Two times a day (BID) | ORAL | 0 refills | Status: DC | PRN
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
## 2019-06-27 ENCOUNTER — Other Ambulatory Visit (HOSPITAL_COMMUNITY): Payer: BLUE CROSS/BLUE SHIELD | Admitting: Licensed Clinical Social Worker

## 2019-06-27 ENCOUNTER — Other Ambulatory Visit: Payer: Self-pay

## 2019-06-27 DIAGNOSIS — F1020 Alcohol dependence, uncomplicated: Secondary | ICD-10-CM

## 2019-06-27 DIAGNOSIS — F411 Generalized anxiety disorder: Secondary | ICD-10-CM

## 2019-06-27 DIAGNOSIS — F3131 Bipolar disorder, current episode depressed, mild: Secondary | ICD-10-CM

## 2019-06-27 DIAGNOSIS — F1220 Cannabis dependence, uncomplicated: Secondary | ICD-10-CM

## 2019-06-27 DIAGNOSIS — F1190 Opioid use, unspecified, uncomplicated: Secondary | ICD-10-CM

## 2019-07-03 ENCOUNTER — Encounter (HOSPITAL_COMMUNITY): Payer: BLUE CROSS/BLUE SHIELD

## 2019-07-03 ENCOUNTER — Telehealth (HOSPITAL_COMMUNITY): Payer: Self-pay | Admitting: Licensed Clinical Social Worker

## 2019-07-04 ENCOUNTER — Encounter (HOSPITAL_COMMUNITY): Payer: BLUE CROSS/BLUE SHIELD

## 2019-07-08 ENCOUNTER — Telehealth (HOSPITAL_COMMUNITY): Payer: Self-pay | Admitting: Licensed Clinical Social Worker

## 2019-07-08 ENCOUNTER — Other Ambulatory Visit (HOSPITAL_COMMUNITY): Payer: Self-pay | Admitting: Medical

## 2019-07-08 ENCOUNTER — Encounter (HOSPITAL_COMMUNITY): Payer: BLUE CROSS/BLUE SHIELD

## 2019-07-08 NOTE — Progress Notes (Incomplete)
  Daily Group Progress Note   Program: CD-IOP     Group Time: 1pm-2:30pm Participation Level: Active Behavioral Response: Appropriate Type of Therapy: Process Group   Topic: Clinician checked in with group members, assessing for SI/HI/psychosis and overall level of functioning. Clinician and group members processed events of the weekend, including highlights and moments of struggle. Clinician and group members processed related thoughts and feelings and coping skills used to address. Clinician and group members read and processed Daily Reflection and relation to current stage of recovery.     Group Time: 2:30pm-4pm Participation Level: Active Behavioral Response: Appropriate Type of Therapy: Psycho-education Group   Topic: Clinician provided guided relaxation skill in session. Clinician presented psycho-educational information on 'Addictive Personalities' using supporting material from The Substance Abuse and Recovery Workbook. Clinician and group members discussed personality traits relating to self-esteem, relationship dynamics, life skills, and excitement. Clinician and group members discussed changes in risk taking in recovery vs active addiction. Clinician inquired a self-care activity planned before next group.    Summary: Client    Family Program: Family present? {BHH YES OR NO:22294}   Name of family member(s): ***  UDS collected: {BHH YES OR NO:22294} Results: {Findings; urine drug screen:60936}  AA/NA attended?: {BHH YES OR NO:22294}{DAYS OF HTDS:28768}  Sponsor?: {BHH YES OR TL:57262}   [name], LCSW

## 2019-07-10 ENCOUNTER — Encounter (HOSPITAL_COMMUNITY): Payer: BLUE CROSS/BLUE SHIELD

## 2019-07-11 NOTE — Progress Notes (Signed)
    Daily Group Progress Note  Program: CD-IOP  Group Time: 1pm-2pm Participation Level: Active Behavioral Response: Appropriate Type of Therapy: Process Group   Topic: Clinician checked in with group members, assessing for SI/HI/psychosis and overall level of functioning. Clinician and group members discussed any trouble with cravings or barriers to successful recovery. Clinician and group members reviewed and discussed AA Daily Reflection and NA Just for today and processed comments in relation to current stage in recovery.      Group Time: 2pm-4pm Participation Level: Active Behavioral Response: Appropriate Type of Therapy: Psycho-education Group   Topic: Clinician utilized psycho-educational session to focus on re-wiring the brain. Clinician utilized supportive material from Dr Halford Chessman 'Happy Brain: How to Overcome Our Neural Predisposition to Suffering.' Clinician and group members discussed natural bias and ways to support healthy thinking.  Clinician and group members practiced adding moments of mindfulness, gratitude, and self-compassion into daily life to support improved mood and long term positive interactions with the world. Clinician and group members reviewed HALT the BS and making decisions based on uncomfortable emotions, including avoiding feelings of abandonment.   Summary: Client reports sobriety date of 06/24/2019. Client rpeorts ongoing stressors with husband and uncomfortable thoughts. Client engaged in discussion related to making decisions based on fear of abandonment in relation specifically to her husband leaving her earlier this year and feelings of low self esteem. Client shows progress toward goal AEB continuing to engage in therapy however also continues to struggle with ongoing substance use. Medicaid has not be approved, client is still in need of insurance for MAT program. Client states she will begin to work in January which she is looking forward to being  productive.   Family Program: Family present? No   Name of family member(s): NA  UDS collected: Yes Results: opioids and marijuana  AA/NA attended?: Yes intermittent virtual attendance  Sponsor?: No   [name], LCSW

## 2019-07-13 ENCOUNTER — Other Ambulatory Visit (HOSPITAL_COMMUNITY): Payer: Self-pay | Admitting: Medical

## 2019-07-15 ENCOUNTER — Encounter (HOSPITAL_COMMUNITY): Payer: BLUE CROSS/BLUE SHIELD

## 2019-07-17 ENCOUNTER — Encounter (HOSPITAL_COMMUNITY): Payer: BLUE CROSS/BLUE SHIELD

## 2019-07-18 ENCOUNTER — Encounter (HOSPITAL_COMMUNITY): Payer: BLUE CROSS/BLUE SHIELD

## 2019-07-19 ENCOUNTER — Other Ambulatory Visit (HOSPITAL_COMMUNITY): Payer: Self-pay | Admitting: Medical

## 2019-07-19 DIAGNOSIS — F3131 Bipolar disorder, current episode depressed, mild: Secondary | ICD-10-CM

## 2019-07-25 ENCOUNTER — Telehealth (HOSPITAL_COMMUNITY): Payer: Self-pay | Admitting: *Deleted

## 2019-07-25 ENCOUNTER — Other Ambulatory Visit (HOSPITAL_COMMUNITY): Payer: Self-pay | Admitting: *Deleted

## 2019-07-25 DIAGNOSIS — F3131 Bipolar disorder, current episode depressed, mild: Secondary | ICD-10-CM

## 2019-07-25 MED ORDER — VENLAFAXINE HCL ER 150 MG PO CP24: ORAL_CAPSULE | ORAL | 0 refills | Status: DC

## 2019-07-25 NOTE — Telephone Encounter (Signed)
Pt has not been seen in one month in CD IOP and is discharged back to Dr Lolly Mustache.please contact Dr Lolly Mustache with this information and ask him to resume prescribing for this patient Thank You

## 2019-07-25 NOTE — Telephone Encounter (Signed)
Writer received message from pt requesting refills on the Effexor and Baclofen. Pt states that she has only 1 Effexor left and doesn't want to go through "withdrawal". Please review.

## 2019-07-26 ENCOUNTER — Other Ambulatory Visit: Payer: Self-pay

## 2019-07-26 ENCOUNTER — Ambulatory Visit (HOSPITAL_COMMUNITY): Payer: BLUE CROSS/BLUE SHIELD | Admitting: Psychiatry

## 2019-07-29 ENCOUNTER — Other Ambulatory Visit (HOSPITAL_COMMUNITY): Payer: BLUE CROSS/BLUE SHIELD | Attending: Psychiatry | Admitting: Medical

## 2019-07-29 DIAGNOSIS — F1199 Opioid use, unspecified with unspecified opioid-induced disorder: Secondary | ICD-10-CM

## 2019-07-29 DIAGNOSIS — F3131 Bipolar disorder, current episode depressed, mild: Secondary | ICD-10-CM | POA: Diagnosis not present

## 2019-07-29 DIAGNOSIS — Z6372 Alcoholism and drug addiction in family: Secondary | ICD-10-CM | POA: Diagnosis not present

## 2019-07-29 DIAGNOSIS — F1420 Cocaine dependence, uncomplicated: Secondary | ICD-10-CM | POA: Diagnosis not present

## 2019-07-29 DIAGNOSIS — F4312 Post-traumatic stress disorder, chronic: Secondary | ICD-10-CM | POA: Insufficient documentation

## 2019-07-29 DIAGNOSIS — T7492XS Unspecified child maltreatment, confirmed, sequela: Secondary | ICD-10-CM

## 2019-07-29 DIAGNOSIS — F10982 Alcohol use, unspecified with alcohol-induced sleep disorder: Secondary | ICD-10-CM | POA: Insufficient documentation

## 2019-07-29 DIAGNOSIS — F10930 Alcohol use, unspecified with withdrawal, uncomplicated: Secondary | ICD-10-CM

## 2019-07-29 DIAGNOSIS — F329 Major depressive disorder, single episode, unspecified: Secondary | ICD-10-CM

## 2019-07-29 DIAGNOSIS — Z79899 Other long term (current) drug therapy: Secondary | ICD-10-CM | POA: Insufficient documentation

## 2019-07-29 DIAGNOSIS — F419 Anxiety disorder, unspecified: Secondary | ICD-10-CM

## 2019-07-29 DIAGNOSIS — F319 Bipolar disorder, unspecified: Secondary | ICD-10-CM

## 2019-07-29 DIAGNOSIS — F1110 Opioid abuse, uncomplicated: Secondary | ICD-10-CM | POA: Diagnosis not present

## 2019-07-29 DIAGNOSIS — F1020 Alcohol dependence, uncomplicated: Secondary | ICD-10-CM

## 2019-07-29 DIAGNOSIS — F1220 Cannabis dependence, uncomplicated: Secondary | ICD-10-CM | POA: Insufficient documentation

## 2019-07-29 DIAGNOSIS — T7422XS Child sexual abuse, confirmed, sequela: Secondary | ICD-10-CM

## 2019-07-29 DIAGNOSIS — Z62810 Personal history of physical and sexual abuse in childhood: Secondary | ICD-10-CM | POA: Diagnosis not present

## 2019-07-29 DIAGNOSIS — F1190 Opioid use, unspecified, uncomplicated: Secondary | ICD-10-CM

## 2019-07-29 DIAGNOSIS — F14288 Cocaine dependence with other cocaine-induced disorder: Secondary | ICD-10-CM

## 2019-07-29 DIAGNOSIS — T7491XS Unspecified adult maltreatment, confirmed, sequela: Secondary | ICD-10-CM

## 2019-07-29 DIAGNOSIS — F10230 Alcohol dependence with withdrawal, uncomplicated: Secondary | ICD-10-CM

## 2019-07-31 ENCOUNTER — Encounter (HOSPITAL_COMMUNITY): Payer: Self-pay | Admitting: Medical
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
## 2019-08-08 ENCOUNTER — Ambulatory Visit (INDEPENDENT_AMBULATORY_CARE_PROVIDER_SITE_OTHER): Payer: BLUE CROSS/BLUE SHIELD | Admitting: Psychiatry

## 2019-08-08 ENCOUNTER — Other Ambulatory Visit: Payer: Self-pay

## 2019-08-08 ENCOUNTER — Encounter (HOSPITAL_COMMUNITY): Payer: Self-pay | Admitting: Psychiatry

## 2019-08-08 DIAGNOSIS — F1199 Opioid use, unspecified with unspecified opioid-induced disorder: Secondary | ICD-10-CM | POA: Diagnosis not present

## 2019-08-08 DIAGNOSIS — F411 Generalized anxiety disorder: Secondary | ICD-10-CM

## 2019-08-08 DIAGNOSIS — F1021 Alcohol dependence, in remission: Secondary | ICD-10-CM

## 2019-08-08 DIAGNOSIS — F3131 Bipolar disorder, current episode depressed, mild: Secondary | ICD-10-CM | POA: Diagnosis not present

## 2019-08-08 DIAGNOSIS — F1190 Opioid use, unspecified, uncomplicated: Secondary | ICD-10-CM

## 2019-08-08 MED ORDER — TRAZODONE HCL 100 MG PO TABS: ORAL_TABLET | ORAL | 1 refills | Status: DC

## 2019-08-08 MED ORDER — VENLAFAXINE HCL ER 150 MG PO CP24: ORAL_CAPSULE | ORAL | 1 refills | Status: DC

## 2019-08-08 MED ORDER — ARIPIPRAZOLE 5 MG PO TABS: 5.0000 mg | ORAL_TABLET | Freq: Every day | ORAL | 1 refills | Status: DC

## 2019-08-08 MED ORDER — HYDROXYZINE PAMOATE 25 MG PO CAPS: 25.0000 mg | ORAL_CAPSULE | Freq: Every day | ORAL | 0 refills | Status: DC | PRN

## 2019-08-08 MED ORDER — NALTREXONE HCL 50 MG PO TABS: 50.0000 mg | ORAL_TABLET | Freq: Every day | ORAL | 1 refills | Status: DC

## 2019-08-08 MED ORDER — PREGABALIN 150 MG PO CAPS: 150.0000 mg | ORAL_CAPSULE | Freq: Three times a day (TID) | ORAL | 1 refills | Status: DC
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
## 2019-09-16 ENCOUNTER — Ambulatory Visit (INDEPENDENT_AMBULATORY_CARE_PROVIDER_SITE_OTHER): Payer: BLUE CROSS/BLUE SHIELD | Admitting: Psychiatry

## 2019-09-16 ENCOUNTER — Other Ambulatory Visit: Payer: Self-pay

## 2019-09-16 ENCOUNTER — Encounter (HOSPITAL_COMMUNITY): Payer: Self-pay | Admitting: Psychiatry

## 2019-09-16 DIAGNOSIS — F411 Generalized anxiety disorder: Secondary | ICD-10-CM | POA: Diagnosis not present

## 2019-09-16 DIAGNOSIS — F1190 Opioid use, unspecified, uncomplicated: Secondary | ICD-10-CM

## 2019-09-16 DIAGNOSIS — F1021 Alcohol dependence, in remission: Secondary | ICD-10-CM

## 2019-09-16 DIAGNOSIS — F3131 Bipolar disorder, current episode depressed, mild: Secondary | ICD-10-CM

## 2019-09-16 DIAGNOSIS — F1199 Opioid use, unspecified with unspecified opioid-induced disorder: Secondary | ICD-10-CM

## 2019-09-16 DIAGNOSIS — F4312 Post-traumatic stress disorder, chronic: Secondary | ICD-10-CM

## 2019-09-16 MED ORDER — TRAZODONE HCL 100 MG PO TABS: ORAL_TABLET | ORAL | 2 refills | Status: DC

## 2019-09-16 MED ORDER — PREGABALIN 150 MG PO CAPS: 150.0000 mg | ORAL_CAPSULE | Freq: Three times a day (TID) | ORAL | 2 refills | Status: DC

## 2019-09-16 MED ORDER — NALTREXONE HCL 50 MG PO TABS: 50.0000 mg | ORAL_TABLET | Freq: Every day | ORAL | 2 refills | Status: DC

## 2019-09-16 MED ORDER — VENLAFAXINE HCL ER 150 MG PO CP24: ORAL_CAPSULE | ORAL | 2 refills | Status: DC

## 2019-09-16 MED ORDER — LAMOTRIGINE 150 MG PO TABS: 150.0000 mg | ORAL_TABLET | Freq: Two times a day (BID) | ORAL | 2 refills | Status: DC

## 2019-09-16 MED ORDER — ARIPIPRAZOLE 5 MG PO TABS: 5.0000 mg | ORAL_TABLET | Freq: Every day | ORAL | 2 refills | Status: DC
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
## 2019-09-19 ENCOUNTER — Telehealth (HOSPITAL_COMMUNITY): Payer: Self-pay | Admitting: *Deleted

## 2019-09-19 NOTE — Telephone Encounter (Signed)
Will wait until rash subsided.

## 2019-09-19 NOTE — Telephone Encounter (Signed)
Writer spoke with pt who is c/o "hive like rash" all over her body. Pt denies itching or blistering but says the ones on her breast look like they are ready to blister. Pt has stopped taking the Lamictal and needs to know if medication change will be made. Please review and advise.

## 2019-12-17 ENCOUNTER — Other Ambulatory Visit: Payer: Self-pay

## 2019-12-17 ENCOUNTER — Telehealth (INDEPENDENT_AMBULATORY_CARE_PROVIDER_SITE_OTHER): Payer: BLUE CROSS/BLUE SHIELD | Admitting: Psychiatry

## 2019-12-17 ENCOUNTER — Encounter (HOSPITAL_COMMUNITY): Payer: Self-pay | Admitting: Psychiatry

## 2019-12-17 VITALS — Wt 220.0 lb

## 2019-12-17 DIAGNOSIS — F1021 Alcohol dependence, in remission: Secondary | ICD-10-CM | POA: Diagnosis not present

## 2019-12-17 DIAGNOSIS — F4312 Post-traumatic stress disorder, chronic: Secondary | ICD-10-CM

## 2019-12-17 DIAGNOSIS — F3131 Bipolar disorder, current episode depressed, mild: Secondary | ICD-10-CM | POA: Diagnosis not present

## 2019-12-17 DIAGNOSIS — F411 Generalized anxiety disorder: Secondary | ICD-10-CM

## 2019-12-17 DIAGNOSIS — F1220 Cannabis dependence, uncomplicated: Secondary | ICD-10-CM | POA: Diagnosis not present

## 2019-12-17 MED ORDER — NALTREXONE HCL 50 MG PO TABS: 50.0000 mg | ORAL_TABLET | Freq: Every day | ORAL | 2 refills | Status: DC

## 2019-12-17 MED ORDER — ARIPIPRAZOLE 5 MG PO TABS: 5.0000 mg | ORAL_TABLET | Freq: Every day | ORAL | 2 refills | Status: DC

## 2019-12-17 MED ORDER — PREGABALIN 150 MG PO CAPS: 150.0000 mg | ORAL_CAPSULE | Freq: Three times a day (TID) | ORAL | 2 refills | Status: DC

## 2019-12-17 MED ORDER — LAMOTRIGINE 150 MG PO TABS: 150.0000 mg | ORAL_TABLET | Freq: Two times a day (BID) | ORAL | 2 refills | Status: DC

## 2019-12-17 MED ORDER — VENLAFAXINE HCL ER 150 MG PO CP24: ORAL_CAPSULE | ORAL | 2 refills | Status: DC

## 2019-12-17 MED ORDER — TRAZODONE HCL 100 MG PO TABS: ORAL_TABLET | ORAL | 2 refills | Status: DC

## 2019-12-17 NOTE — Progress Notes (Addendum)
Virtual Visit via Telephone Note  I connected with Tammy Wilkinson on 03/47/42 at 10:20 AM EDT by telephone and verified that I am speaking with the correct person using two identifiers.   I discussed the limitations, risks, security and privacy concerns of performing an evaluation and management service by telephone and the availability of in person appointments. I also discussed with the patient that there may be a patient responsible charge related to this service. The patient expressed understanding and agreed to proceed.  Patient location; home Provider location; home office  History of Present Illness: Patient is evaluated by phone session.  She is thinking back to be with her husband and hoping they have reconciliation.  Currently she is living with her father who is supportive.  She is working at [employer] but after the tax season she has to find a new job.  Her court date for the assault charges is postponed until first week of June.  She is in touch with a Chief Executive Officer.  Overall she feels things are going well however she continues to have sometimes anxiety especially at night.  But overall she denies any mania, psychosis, anger, self abusive behavior or any irritability.  Her 81-year-old daughter recently graduated preschool.  Her 73 year old daughter lives with her father.  Patient admitted some time smoke marijuana but she is no longer drinking or using opiates.  She is back on Lamictal after she find out that her rash was due to ringworm and not with the Lamictal.  She is not interested in therapy.  She admitted increased weight gain because she is not doing exercise but now recently she had a past waterpark and hoping to use regularly to lose weight.    Past Psychiatric History:Reviewed. H/Ocutting, mood swing, mania, anger, promiscuity, abusing pain medication, cocaine and ETOH. Diagnosed ADHD, bipolar, BPD, PTSD and anxiety. Saw Dr Lovena Le at [place].Took Effexor, BuSpar, gabapentin,Vistaril,  Prozac, Cymbalta, Seroquel, trazodone, Zoloft, Lexapro, Klonopin, Abilify, Valium, Adderall, Xanax and Klonopin. No h/o inpatient or suicidal attempt.Did CDIOP in Aug 2020. Given baclofen and clonidine.   Psychiatric Specialty Exam: Physical Exam  Review of Systems  There were no vitals taken for this visit.There is no height or weight on file to calculate BMI.  General Appearance: NA  Eye Contact:  NA  Speech:  Clear and Coherent  Volume:  Normal  Mood:  Anxious  Affect:  NA  Thought Process:  Descriptions of Associations: Intact  Orientation:  Full (Time, Place, and Person)  Thought Content:  Rumination  Suicidal Thoughts:  No  Homicidal Thoughts:  No  Memory:  Immediate;   Good Recent;   Good Remote;   Good  Judgement:  Intact  Insight:  Present  Psychomotor Activity:  NA  Concentration:  Concentration: Fair and Attention Span: Fair  Recall:  Good  Fund of Knowledge:  Good  Language:  Good  Akathisia:  No  Handed:  Right  AIMS (if indicated):     Assets:  Communication Skills Desire for Improvement Resilience Social Support  ADL's:  Intact  Cognition:  WNL  Sleep:   good      Assessment and Plan: PTSD.  Bipolar disorder type I.  Alcohol dependence in early remission.  Generalized anxiety disorder.  Cannabis use mild.  Patient occasionally have nightmares and anxiety at night.  She recall prazosin helps in the past however she is already taking multiple medication and I am afraid to add more medication.  I gave her option if she wants to  stop the trazodone and try prazosin but she did not agree with that.  She is back on Lamictal after find out that rash is due to ringworm.  I will continue Lamictal 150 mg twice a day, Lyrica 150 mg 3 times a day, Abilify 5 mg daily, trazodone 100 mg at bedtime, Effexor 150 mg daily and naltrexone 50 mg daily.  Discussed to stop cannabis use.  Encouraged to use waterpark pass more to be more active that may help lose weight.  Patient  is also trying to get new job and hoping to have her own place if reconciliation with her husband does not work.  She is not interested in therapy.  I recommend to call us back if she has any questions or any concerns.  Follow-up in 3 months.     Follow Up Instructions:    I discussed the assessment and treatment plan with the patient. The patient was provided an opportunity to ask questions and all were answered. The patient agreed with the plan and demonstrated an understanding of the instructions.   The patient was advised to call back or seek an in-person evaluation if the symptoms worsen or if the condition fails to improve as anticipated.  I provided 25 minutes of non-face-to-face time during this encounter.   Cleotis Nipper, MD

## 2020-03-12 ENCOUNTER — Telehealth (HOSPITAL_COMMUNITY): Payer: Self-pay | Admitting: *Deleted

## 2020-03-12 NOTE — Telephone Encounter (Signed)
Patient called to ask if you can do a court ordered Mental Health Evaluation on her. Please advise.

## 2020-03-13 NOTE — Telephone Encounter (Signed)
No we don't

## 2020-03-16 ENCOUNTER — Telehealth (HOSPITAL_COMMUNITY): Payer: Self-pay | Admitting: *Deleted

## 2020-03-16 ENCOUNTER — Telehealth (HOSPITAL_COMMUNITY): Payer: Self-pay | Admitting: Licensed Clinical Social Worker

## 2020-03-16 NOTE — Telephone Encounter (Signed)
Patient called and informed that we do not do Court Ordered mental health assessments. Told her she could try [facility] for this service. FYI

## 2020-03-18 ENCOUNTER — Telehealth (HOSPITAL_COMMUNITY): Payer: BLUE CROSS/BLUE SHIELD | Admitting: Psychiatry

## 2020-03-18 ENCOUNTER — Other Ambulatory Visit: Payer: Self-pay

## 2020-04-01 ENCOUNTER — Encounter (HOSPITAL_COMMUNITY): Payer: Self-pay | Admitting: Psychiatry

## 2020-04-01 ENCOUNTER — Other Ambulatory Visit: Payer: Self-pay

## 2020-04-01 ENCOUNTER — Telehealth (INDEPENDENT_AMBULATORY_CARE_PROVIDER_SITE_OTHER): Payer: BLUE CROSS/BLUE SHIELD | Admitting: Psychiatry

## 2020-04-01 VITALS — Wt 230.0 lb

## 2020-04-01 DIAGNOSIS — F411 Generalized anxiety disorder: Secondary | ICD-10-CM

## 2020-04-01 DIAGNOSIS — F3131 Bipolar disorder, current episode depressed, mild: Secondary | ICD-10-CM | POA: Diagnosis not present

## 2020-04-01 DIAGNOSIS — F1021 Alcohol dependence, in remission: Secondary | ICD-10-CM

## 2020-04-01 DIAGNOSIS — F1220 Cannabis dependence, uncomplicated: Secondary | ICD-10-CM

## 2020-04-01 DIAGNOSIS — F4312 Post-traumatic stress disorder, chronic: Secondary | ICD-10-CM | POA: Diagnosis not present

## 2020-04-01 MED ORDER — VENLAFAXINE HCL ER 150 MG PO CP24: ORAL_CAPSULE | ORAL | 2 refills | Status: DC

## 2020-04-01 MED ORDER — NALTREXONE HCL 50 MG PO TABS: 50.0000 mg | ORAL_TABLET | Freq: Every day | ORAL | 2 refills | Status: DC

## 2020-04-01 MED ORDER — LAMOTRIGINE 150 MG PO TABS: 150.0000 mg | ORAL_TABLET | Freq: Two times a day (BID) | ORAL | 2 refills | Status: DC

## 2020-04-01 MED ORDER — ARIPIPRAZOLE 5 MG PO TABS: 5.0000 mg | ORAL_TABLET | Freq: Every day | ORAL | 2 refills | Status: DC

## 2020-04-01 MED ORDER — TRAZODONE HCL 100 MG PO TABS: ORAL_TABLET | ORAL | 2 refills | Status: DC

## 2020-04-01 MED ORDER — PREGABALIN 150 MG PO CAPS: 150.0000 mg | ORAL_CAPSULE | Freq: Three times a day (TID) | ORAL | 2 refills | Status: DC

## 2020-04-01 NOTE — Progress Notes (Signed)
Virtual Visit via Telephone Note  I connected with [place] on 04/01/20 at  1:20 PM EDT by telephone and verified that I am speaking with the correct person using two identifiers.  Location: Patient: home Provider: home work   I discussed the limitations, risks, security and privacy concerns of performing an evaluation and management service by telephone and the availability of in person appointments. I also discussed with the patient that there may be a patient responsible charge related to this service. The patient expressed understanding and agreed to proceed.   History of Present Illness: Patient is evaluated by phone session.  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  She like her job her job just started a week ago.  She just find out that her 33-year-old daughter is COVID positive but does not have active symptoms.  Her daughter is not going to school and staying with the patient.  Patient admitted lately increased stress and anxiety because judge ordered her to be in substance use program.  Patient admitted that she is smoking marijuana at least 3-4 times a week.  However she is no longer taking any pain medicine and not drinking alcohol.  She also admitted not taking naltrexone because she thought that she can do well without the medicine.  She endorsed weight gain 10 pounds since the last visit.  She admitted impulsive eating.  She is started talking to her ex-husband and hoping 1 day they have reconciliation.  However her husband wants her to have a stable job.  She worried about her 33 year old daughter who lives with her father.  Patient told her 33 year old daughter has to change school as she is not living with her.  Patient told her daughter has adjusting to school.  Patient denies any mood swings, anger, mania, psychosis.  She denies any hallucination or any paranoia.  She has no rash or any itching.  She is thinking to restart therapy and like to have Dominica a call back.      Past Psychiatric History:Reviewed. H/Ocutting,mood swing,mania, anger,promiscuity, abusing pain medication,cocaine and ETOH.Diagnosed ADHD, bipolar, BPD, PTSD and anxiety. [facility]ona Ridgel at [place].TookEffexor, BuSpar, gabapentin,Vistaril,Prozac, Cymbalta, Seroquel, trazodone, Zoloft, Lexapro, Klonopin, Abilify, Valium, Adderall, Xanax and Klonopin. Noh/o inpatient orsuicidal attempt.DidCDIOP in Aug 2020.Given baclofen and clonidine.  Psychiatric Specialty Exam: Physical Exam  Review of Systems  Weight 230 lb (104.3 kg).There is no height or weight on file to calculate BMI.  General Appearance: NA  Eye Contact:  NA  Speech:  Normal Rate  Volume:  Normal  Mood:  Anxious  Affect:  NA  Thought Process:  Goal Directed  Orientation:  Full (Time, Place, and Person)  Thought Content:  Rumination  Suicidal Thoughts:  No  Homicidal Thoughts:  No  Memory:  Immediate;   Good Recent;   Good Remote;   Good  Judgement:  Fair  Insight:  Shallow  Psychomotor Activity:  NA  Concentration:  Concentration: Fair and Attention Span: Fair  Recall:  Good  Fund of Knowledge:  Good  Language:  Good  Akathisia:  No  Handed:  Right  AIMS (if indicated):     Assets:  Communication Skills Desire for Improvement Housing Social Support Transportation  ADL's:  Intact  Cognition:  WNL  Sleep:   good      Assessment and Plan: Chronic PTSD.  Bipolar disorder type I.  Alcohol dependence in early remission.  Anxiety.  Cannabis use.  Discussed that she need to take naltrexone which was helping  her clearing from marijuana and impulsive eating.  She agreed to go back on naltrexone.  We will message Cloria Spring as patient like to talk to her to go back in therapy.  She occasionally have nightmares.  We have tried higher dose of Abilify but she gained weight.  We will continue current medicine which is Lamictal 150 mg twice a day, Lyrica 150 mg 3 times a day, Abilify 5 mg daily, trazodone  100 mg at bedtime, Effexor 150 mg daily and she will start naltrexone 50 mg daily.  Discussed to stop cannabis use.  She has to go to court for assault charges.  Encourage walking and watch her caloric intake.  Recommended to call us back if she is any question or any concern.  Follow-up in 3 months.  Follow Up Instructions:    I discussed the assessment and treatment plan with the patient. The patient was provided an opportunity to ask questions and all were answered. The patient agreed with the plan and demonstrated an understanding of the instructions.   The patient was advised to call back or seek an in-person evaluation if the symptoms worsen or if the condition fails to improve as anticipated.  I provided 21 minutes of non-face-to-face time during this encounter.   Cleotis Nipper, MD

## 2020-04-07 ENCOUNTER — Other Ambulatory Visit: Payer: Self-pay

## 2020-04-07 ENCOUNTER — Ambulatory Visit (HOSPITAL_COMMUNITY): Payer: BLUE CROSS/BLUE SHIELD | Admitting: Licensed Clinical Social Worker

## 2020-04-07 ENCOUNTER — Telehealth (HOSPITAL_COMMUNITY): Payer: Self-pay | Admitting: Licensed Clinical Social Worker

## 2020-04-07 NOTE — Progress Notes (Unsigned)
Client no showed for scheduled intake appointment for CDIOP. Outreach completed with no return contact. Client may reschedule at a future date.

## 2020-04-15 ENCOUNTER — Telehealth (HOSPITAL_COMMUNITY): Payer: Self-pay | Admitting: Licensed Clinical Social Worker

## 2020-04-22 ENCOUNTER — Ambulatory Visit (INDEPENDENT_AMBULATORY_CARE_PROVIDER_SITE_OTHER): Payer: BLUE CROSS/BLUE SHIELD | Admitting: Licensed Clinical Social Worker

## 2020-04-22 ENCOUNTER — Other Ambulatory Visit: Payer: Self-pay

## 2020-04-22 DIAGNOSIS — F411 Generalized anxiety disorder: Secondary | ICD-10-CM | POA: Diagnosis not present

## 2020-04-22 DIAGNOSIS — F3131 Bipolar disorder, current episode depressed, mild: Secondary | ICD-10-CM

## 2020-04-22 DIAGNOSIS — F1190 Opioid use, unspecified, uncomplicated: Secondary | ICD-10-CM

## 2020-04-22 DIAGNOSIS — F1220 Cannabis dependence, uncomplicated: Secondary | ICD-10-CM | POA: Diagnosis not present

## 2020-04-22 DIAGNOSIS — F1021 Alcohol dependence, in remission: Secondary | ICD-10-CM

## 2020-04-22 DIAGNOSIS — F14288 Cocaine dependence with other cocaine-induced disorder: Secondary | ICD-10-CM

## 2020-04-22 DIAGNOSIS — F4312 Post-traumatic stress disorder, chronic: Secondary | ICD-10-CM
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
## 2020-04-27 ENCOUNTER — Encounter (HOSPITAL_COMMUNITY): Payer: BLUE CROSS/BLUE SHIELD

## 2020-04-29 ENCOUNTER — Other Ambulatory Visit: Payer: Self-pay

## 2020-04-29 ENCOUNTER — Other Ambulatory Visit (HOSPITAL_COMMUNITY): Payer: BLUE CROSS/BLUE SHIELD | Attending: Psychiatry | Admitting: Licensed Clinical Social Worker

## 2020-04-29 DIAGNOSIS — Z6372 Alcoholism and drug addiction in family: Secondary | ICD-10-CM | POA: Diagnosis not present

## 2020-04-29 DIAGNOSIS — F1994 Other psychoactive substance use, unspecified with psychoactive substance-induced mood disorder: Secondary | ICD-10-CM

## 2020-04-29 DIAGNOSIS — F10982 Alcohol use, unspecified with alcohol-induced sleep disorder: Secondary | ICD-10-CM | POA: Insufficient documentation

## 2020-04-29 DIAGNOSIS — F3131 Bipolar disorder, current episode depressed, mild: Secondary | ICD-10-CM | POA: Diagnosis not present

## 2020-04-29 DIAGNOSIS — F4312 Post-traumatic stress disorder, chronic: Secondary | ICD-10-CM | POA: Diagnosis not present

## 2020-04-29 DIAGNOSIS — F17210 Nicotine dependence, cigarettes, uncomplicated: Secondary | ICD-10-CM

## 2020-04-29 DIAGNOSIS — F1020 Alcohol dependence, uncomplicated: Secondary | ICD-10-CM | POA: Diagnosis present

## 2020-04-29 DIAGNOSIS — F10229 Alcohol dependence with intoxication, unspecified: Secondary | ICD-10-CM | POA: Diagnosis not present

## 2020-04-29 DIAGNOSIS — F1110 Opioid abuse, uncomplicated: Secondary | ICD-10-CM | POA: Insufficient documentation

## 2020-04-29 DIAGNOSIS — F419 Anxiety disorder, unspecified: Secondary | ICD-10-CM

## 2020-04-29 DIAGNOSIS — F32A Depression, unspecified: Secondary | ICD-10-CM

## 2020-04-29 DIAGNOSIS — F1021 Alcohol dependence, in remission: Secondary | ICD-10-CM

## 2020-04-29 DIAGNOSIS — F1420 Cocaine dependence, uncomplicated: Secondary | ICD-10-CM | POA: Insufficient documentation

## 2020-04-29 DIAGNOSIS — F1190 Opioid use, unspecified, uncomplicated: Secondary | ICD-10-CM

## 2020-04-29 DIAGNOSIS — F14288 Cocaine dependence with other cocaine-induced disorder: Secondary | ICD-10-CM

## 2020-04-29 DIAGNOSIS — F1220 Cannabis dependence, uncomplicated: Secondary | ICD-10-CM | POA: Insufficient documentation

## 2020-04-29 DIAGNOSIS — T7492XS Unspecified child maltreatment, confirmed, sequela: Secondary | ICD-10-CM

## 2020-04-29 DIAGNOSIS — T7491XS Unspecified adult maltreatment, confirmed, sequela: Secondary | ICD-10-CM
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
## 2020-04-30 ENCOUNTER — Other Ambulatory Visit: Payer: Self-pay

## 2020-04-30 ENCOUNTER — Other Ambulatory Visit (HOSPITAL_COMMUNITY): Payer: BLUE CROSS/BLUE SHIELD | Admitting: Licensed Clinical Social Worker

## 2020-04-30 DIAGNOSIS — F1994 Other psychoactive substance use, unspecified with psychoactive substance-induced mood disorder: Secondary | ICD-10-CM

## 2020-04-30 DIAGNOSIS — F10229 Alcohol dependence with intoxication, unspecified: Secondary | ICD-10-CM | POA: Diagnosis not present

## 2020-04-30 DIAGNOSIS — F1220 Cannabis dependence, uncomplicated: Secondary | ICD-10-CM

## 2020-04-30 DIAGNOSIS — F1021 Alcohol dependence, in remission: Secondary | ICD-10-CM

## 2020-04-30 DIAGNOSIS — F17210 Nicotine dependence, cigarettes, uncomplicated: Secondary | ICD-10-CM

## 2020-05-04 ENCOUNTER — Other Ambulatory Visit: Payer: Self-pay

## 2020-05-04 ENCOUNTER — Other Ambulatory Visit (HOSPITAL_COMMUNITY): Payer: BLUE CROSS/BLUE SHIELD | Admitting: Licensed Clinical Social Worker

## 2020-05-04 DIAGNOSIS — F1994 Other psychoactive substance use, unspecified with psychoactive substance-induced mood disorder: Secondary | ICD-10-CM

## 2020-05-04 DIAGNOSIS — F4312 Post-traumatic stress disorder, chronic: Secondary | ICD-10-CM

## 2020-05-04 DIAGNOSIS — F1021 Alcohol dependence, in remission: Secondary | ICD-10-CM

## 2020-05-04 DIAGNOSIS — F1220 Cannabis dependence, uncomplicated: Secondary | ICD-10-CM

## 2020-05-04 DIAGNOSIS — F10229 Alcohol dependence with intoxication, unspecified: Secondary | ICD-10-CM | POA: Diagnosis not present

## 2020-05-06 ENCOUNTER — Other Ambulatory Visit: Payer: Self-pay

## 2020-05-06 ENCOUNTER — Encounter (HOSPITAL_COMMUNITY): Payer: Self-pay | Admitting: Medical

## 2020-05-06 ENCOUNTER — Other Ambulatory Visit (INDEPENDENT_AMBULATORY_CARE_PROVIDER_SITE_OTHER): Payer: BLUE CROSS/BLUE SHIELD | Admitting: Licensed Clinical Social Worker

## 2020-05-06 DIAGNOSIS — F32A Depression, unspecified: Secondary | ICD-10-CM

## 2020-05-06 DIAGNOSIS — F419 Anxiety disorder, unspecified: Secondary | ICD-10-CM

## 2020-05-06 DIAGNOSIS — F1190 Opioid use, unspecified, uncomplicated: Secondary | ICD-10-CM

## 2020-05-06 DIAGNOSIS — T7491XS Unspecified adult maltreatment, confirmed, sequela: Secondary | ICD-10-CM

## 2020-05-06 DIAGNOSIS — F4312 Post-traumatic stress disorder, chronic: Secondary | ICD-10-CM

## 2020-05-06 DIAGNOSIS — Z8659 Personal history of other mental and behavioral disorders: Secondary | ICD-10-CM

## 2020-05-06 DIAGNOSIS — T7492XS Unspecified child maltreatment, confirmed, sequela: Secondary | ICD-10-CM

## 2020-05-06 DIAGNOSIS — F10229 Alcohol dependence with intoxication, unspecified: Secondary | ICD-10-CM | POA: Diagnosis not present

## 2020-05-06 DIAGNOSIS — F1220 Cannabis dependence, uncomplicated: Secondary | ICD-10-CM

## 2020-05-06 DIAGNOSIS — F1994 Other psychoactive substance use, unspecified with psychoactive substance-induced mood disorder: Secondary | ICD-10-CM

## 2020-05-06 DIAGNOSIS — Z6372 Alcoholism and drug addiction in family: Secondary | ICD-10-CM

## 2020-05-06 DIAGNOSIS — F14288 Cocaine dependence with other cocaine-induced disorder: Secondary | ICD-10-CM

## 2020-05-06 DIAGNOSIS — F1021 Alcohol dependence, in remission: Secondary | ICD-10-CM

## 2020-05-06 NOTE — Progress Notes (Signed)
    Daily Group Progress Note  Program: CD-IOP   Group Time: 1-2:30pm Participation Level: Active Behavioral Response: Appropriate Type of Therapy: Process Group Topic: Clinician checked in with group members, assessing for SI/HI/psychosis and overall level of functioning, including cravings, threats to sobriety relapse, and number of community support groups attended since last session. Clinician and group members processed 'highs' and 'lows' since last group and how challenges effected their thoughts or behaviors. Clinician and group members read JFT and AA Daily meditation and discussed belonging and humility with an open mind.  Group Time: 2:30pm-4pm Participation Level: Active Behavioral Response: Appropriate Type of Therapy: Process Group Topic: Clinician provided clients with Personal Mission Statement which was completed and read aloud. Clinician provided psycho-educational information on Dysfunctional Family Roles and the Family and family 'Culture' related to substance abuse. Clinician and group members discussed reasons for roles including ability to rational, denial, avoidance of pain, and excuses to continue behaviors. Clinician reviewed roles and discussed with clients roles in current family as the chemically dependent person and any roles in family of origin. Clinician inquired about self care activity to support recovery to be completed before next group.   Summary: Client checked in with sobriety date of 05/01/20 for marijuana, reporting continued sobriety of more than 1 year from alcohol. Client processed with group and provided support for other members on changes in cravings throughout recovery. Client processed with group the  discrepancy in expectations of her changed behaviors from husband. Client shows progress toward goals AEB ability to use skill of talking about at a different time to avoid confrontation in the moment. Client reported her mom was her primary enabler  before and after finding out about client's addiction.    Family Program: Family present? No   Name of family member(s): NA  UDS collected: Yes Results: pending  AA/NA attended?: Yes 1 virtual  Sponsor?: No   Harlon Ditty, LCSW
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
## 2020-05-07 ENCOUNTER — Other Ambulatory Visit (HOSPITAL_COMMUNITY): Payer: BLUE CROSS/BLUE SHIELD

## 2020-05-07 ENCOUNTER — Other Ambulatory Visit: Payer: Self-pay

## 2020-05-11 ENCOUNTER — Other Ambulatory Visit (HOSPITAL_COMMUNITY): Payer: BLUE CROSS/BLUE SHIELD | Admitting: Licensed Clinical Social Worker

## 2020-05-11 ENCOUNTER — Other Ambulatory Visit: Payer: Self-pay

## 2020-05-11 DIAGNOSIS — F10229 Alcohol dependence with intoxication, unspecified: Secondary | ICD-10-CM | POA: Diagnosis not present

## 2020-05-11 DIAGNOSIS — F1994 Other psychoactive substance use, unspecified with psychoactive substance-induced mood disorder: Secondary | ICD-10-CM

## 2020-05-11 DIAGNOSIS — F1021 Alcohol dependence, in remission: Secondary | ICD-10-CM

## 2020-05-11 DIAGNOSIS — F1220 Cannabis dependence, uncomplicated: Secondary | ICD-10-CM

## 2020-05-12 NOTE — Progress Notes (Signed)
    Daily Group Progress Note  Program: CD-IOP   Group Time: 1-2:30pm Participation Level: Active  Behavioral Response: Appropriate Type of Therapy: Process Group   Topic: Clinician checked in with group members, assessing for SI/HI/psychosis and overall level of functioning, including cravings, relapse, and participation in community support meetings. Clinician explored with clients identifying and processing 'highs' and 'lows' since last group and processed any 'roadblocks' to recovery. Clinician and group members read and processed AA's Daily Reflection and NA's Just For Today and processed principles before personalities.  Group Time: 2:30pm-4pm Participation Level:  Active Behavioral Response: Appropriate  Type of Therapy: Psycho-education Group  Topic: Clinician Presented the topic of co-dependency. Group members completed codependency characteristics scale and reviewed descriptions. Clinician and group members reviewed attachment styles and explored the effect of substance use on self esteem and relationships with others. Clinician inquired about self care activity planned for the day.   Summary: Tammy Wilkinson reports sobriety date of 05/09/20, attending 2 meetings since last session. Client processed with group interactions with husband over the weekend and support vs judgement for her sobriety and behaviors. Client showed progress toward goals AEB identifying high risk situations however continues to struggle with boundaries to maintain sobriety. Client was receptive to feedback from the group. Client reviewed provided psycho-education and shared difference in self esteem score from previous year which she reports improved. Client continued to struggle with guilt related to thoughts of stopping caretaking of father despite unhealthy boundaries and poor support in living situation.   Family Program: Family present? No   Name of family member(s): NA  UDS collected: Yes Results: THC    AA/NA attended?: Yes 2  Sponsor?: No   Harlon Ditty, LCSW

## 2020-05-12 NOTE — Progress Notes (Signed)
    Daily Group Progress Note  Program: CD-IOP   Group Time: 1pm-2:30pm Participation Level: Active Behavioral Response: Appropriate Type of Therapy: Process Group Topic: Clinician checked in with group members, assessing for SI/HI/psychosis and overall level of functioning, including cravings or triggers and coping skills used to deal with uncomfortable feelings. Clinician and group members discussed what went well and could have gone better over the weekend. Group members processed AA Daily Reflection and NA JFT with topics focused on the effect of having the freedom to choose on recovery.  Group Time: 2:30pm-4pm Participation Level: Active Behavioral Response: Appropriate Type of Therapy: Psycho-education Group Topic: Clinician presented the psycho-educational topic of Adult Children of Alcoholics (ACOA). Clinician reviewed with clients the 'Laundry List' and common thought and behavior patterns of families with substance use in the home. Clinician and group members discussed how this affected believes/behaviors in work, intimate and interpersonal relationships. Clinician and group members discussed specifically the importance or lack of importance of knowing intentions of behaviors. Clinician provided clients with '10 Things Adult Children of Addicts Wants You To Know."   Summary: Client checked in with sobriety date of 05/01/20. Client was able to identify some traits in self from ACOA list growing up and currently in the home with a father in active addiction. Client resonated specifically with feeling comfortable in chaos. Client processed wanting to be mindful of behaviors from self and husband and the effect of personal behaviors on her children.   Family Program: Family present? No   Name of family member(s): NA  UDS collected: No Results: pending; clt reports will be prescribed medications and THC  AA/NA attended?: no  Sponsor?: No   Harlon Ditty, LCSW

## 2020-05-12 NOTE — Progress Notes (Signed)
    Daily Group Progress Note  Program: CD-IOP   Group Time: 1pm-2:30pm Participation Level: Active Behavioral Response: Appropriate Type of Therapy: Process Group   Topic: Clinician checked in with group members, assessing for SI/HI/psychosis and overall level of functioning. Clinician and group members discussed any trouble with cravings or barriers to successful recovery. Clinician and group members reviewed and discussed AA Daily Reflection and NA Just for today and processed comments in relation to current stage in recovery.      Group Time: 2:30pm-4pm Participation Level: Active Behavioral Response: Appropriate Type of Therapy: Psycho-education Group   Topic: Clinician utilized psycho-educational session to focus on re-wiring the brain. Clinician utilized supportive material from Dr Craige Cotta 'Happy Brain: How to Overcome Our Neural Predisposition to Suffering.' Clinician and group members discussed natural bias and ways to support healthy thinking.  Clinician and group members practiced adding moments of mindfulness, gratitude, and self-compassion into daily life to support improved mood and long term positive interactions with the world. Clinician presented DBT skill 'Letting Go of Emotional Suffering: Mindfulness of Your Current Emotion.' Group practiced with clinician skills to regulate emotional intensity. Clinician inquired about self care plan for the weekend.   Summary: Client checked in with a sobriety date of 04/29/20. Client processed primary stressors in her life and where she believes she has made improvements since last time in CDIOP. Client shared thoughts on video and how she has attempted to focus on gratitude to avoid 'spiraling' with thoughts or depressive symptoms. Client considered a response to group members about allowing a set amount of time to 'sit in feelings' before creating action to alter the moment.   Family Program: Family present? No   Name of family  member(s): NA  UDS collected: Yes Results: reported THC will be positive on UDS  AA/NA attended?: No  Sponsor?: No   Harlon Ditty, LCSW

## 2020-05-13 ENCOUNTER — Other Ambulatory Visit: Payer: Self-pay

## 2020-05-13 ENCOUNTER — Other Ambulatory Visit (HOSPITAL_COMMUNITY): Payer: BLUE CROSS/BLUE SHIELD | Admitting: Licensed Clinical Social Worker

## 2020-05-13 DIAGNOSIS — F1021 Alcohol dependence, in remission: Secondary | ICD-10-CM

## 2020-05-13 DIAGNOSIS — F1220 Cannabis dependence, uncomplicated: Secondary | ICD-10-CM

## 2020-05-13 DIAGNOSIS — F1994 Other psychoactive substance use, unspecified with psychoactive substance-induced mood disorder: Secondary | ICD-10-CM

## 2020-05-13 DIAGNOSIS — F10229 Alcohol dependence with intoxication, unspecified: Secondary | ICD-10-CM | POA: Diagnosis not present

## 2020-05-13 NOTE — Progress Notes (Signed)
    Daily Group Progress Note  Program: CD-IOP   Group Time: 1pm-2pm Participation Level: Active Behavioral Response: Appropriate Type of Therapy: Process Group   Topic: Clinician checked in with group members, assessing for SI/HI/psychosis and overall level of functioning including relapse and barriers to recovery. Clinician and group members discussed highlights and struggles since last group related to maintaining sobriety including identified triggers and responses. Clinician and group members processed Daily Reflection and  personal meeting to current work in recovery. Clinician provided mindfulness activity.     Group Time: 2pm-4pm Participation Level: Active Behavioral Response: Appropriate Type of Therapy: Psycho-education Group   Topic: Clinician and group members reviewed accomplishments and needs assessment, including physical, emotional, cognitive, and social needs. Clinician and group members discussed what it looks like to have needs met, and what it looks like when their personal needs are not met. Clinician facilitated discussion on self-awareness of feelings, unmet needs, and fears related to asking for help. Clinician facilitated discussion on meeting needs in healthy vs unhealthy way. Clinician and group members discussed breaking unhealthy patterns of getting needs met and provided examples of helpful and unhelpful needs instructions. Clinician inquired about self care plans for the weekend.   Summary: Client reported sobriety date of 04/29/20, stating finishing off current product. Client shared motivation for re-engaging in [place] and triggers for continued substance use. Client reports recent positive changes and maintained sobriety from alcohol but continuing to struggle with cannabis use. Client met with PA, see note for additional details.  Family Program: Family present? No   Name of family member(s): NA  UDS collected: Yes Results: reports will be positive for  [facility]  AA/NA attended?: No; willing to attend virtual meetings  Sponsor?: No   [name], LCSW

## 2020-05-14 ENCOUNTER — Encounter (HOSPITAL_COMMUNITY): Payer: BLUE CROSS/BLUE SHIELD

## 2020-05-14 NOTE — Progress Notes (Signed)
° ° °  Daily Group Progress Note  Program: CD-IOP   Group Time: 1pm-2:30pm Participation Level: Active Behavioral Response: Appropriate Type of Therapy: Process Group Topic: Clinician met with group assessing SI/HI/psychosis and overall level of functioning including cravings or relapse. Clinician inquired about sobriety dates, community meetings attended and what supported and challenged recovery over the weekend. Clinician and group members read and discussed AA Daily Reflection focused on becoming willing and NA JFT. Group explored how addiction vs recovery effected decisions to be honest and willing. Group members processed [place] (fear of missing out) around the holidays without substances. Clinician and group members discussed options for sober activities over the weekend. Group members processed coping with grief for the first time sober and the differences in avoidance vs distress tolerance and crisis management skills.   Group Time: 2:30pm-4pm Participation Level: Active Behavioral Response: Appropriate Type of Therapy: Psycho-education Group Topic: Clinician presented the psycho-educational topic of Willingness vs Willfulness. Clinician and group members defined willingness and willfulness and recent moments of each related to addiction and sobriety. Clinician provided supplemental video on Willingness as an Antidote to Anxiety. Clinician and group members discussed the practice of sitting in uncomfortable emotions or physical sensations and resisting the urge to act impulsively, specifically in relation to cravings in early recovery. Group members completed activity presented in video and clinician reviewed additional skill of Half-smiling and Willing hands.   Summary: Client presented late due to oversleeping again. Client will be considered for behavior contact due to multiple missed or late groups due to oversleeping. Client shared becoming emotional missing sister who passed away  because she loved Halloween. Client reports being accepting of emotional releases such as crying, which other group members did not feel comfortable displaying. Client was planning to spend time with kids to avoid using during holiday weekend. Client remembered willingness topic from previous treatment and resonated with making choices voluntarily rather than being forced into choosing uncomfortable Client sobriety date remains 05/09/20.   Family Program: Family present? No   Name of family member(s): NA  UDS collected: No Results: pending. Reports will be positive for [facility]  AA/NA attended?: No  Sponsor?: No   [name], LCSW

## 2020-05-18 ENCOUNTER — Encounter (HOSPITAL_COMMUNITY): Payer: BLUE CROSS/BLUE SHIELD

## 2020-05-19 ENCOUNTER — Telehealth (HOSPITAL_COMMUNITY): Payer: Self-pay | Admitting: Licensed Clinical Social Worker

## 2020-05-20 ENCOUNTER — Other Ambulatory Visit: Payer: Self-pay

## 2020-05-20 ENCOUNTER — Other Ambulatory Visit (HOSPITAL_COMMUNITY): Payer: BLUE CROSS/BLUE SHIELD | Attending: Psychiatry | Admitting: Medical

## 2020-05-20 DIAGNOSIS — Z6372 Alcoholism and drug addiction in family: Secondary | ICD-10-CM | POA: Insufficient documentation

## 2020-05-20 DIAGNOSIS — T7492XS Unspecified child maltreatment, confirmed, sequela: Secondary | ICD-10-CM

## 2020-05-20 DIAGNOSIS — F329 Major depressive disorder, single episode, unspecified: Secondary | ICD-10-CM | POA: Insufficient documentation

## 2020-05-20 DIAGNOSIS — F1021 Alcohol dependence, in remission: Secondary | ICD-10-CM

## 2020-05-20 DIAGNOSIS — F4310 Post-traumatic stress disorder, unspecified: Secondary | ICD-10-CM | POA: Insufficient documentation

## 2020-05-20 DIAGNOSIS — F1420 Cocaine dependence, uncomplicated: Secondary | ICD-10-CM | POA: Insufficient documentation

## 2020-05-20 DIAGNOSIS — F1994 Other psychoactive substance use, unspecified with psychoactive substance-induced mood disorder: Secondary | ICD-10-CM | POA: Insufficient documentation

## 2020-05-20 DIAGNOSIS — F4312 Post-traumatic stress disorder, chronic: Secondary | ICD-10-CM

## 2020-05-20 DIAGNOSIS — F17200 Nicotine dependence, unspecified, uncomplicated: Secondary | ICD-10-CM

## 2020-05-20 DIAGNOSIS — F1220 Cannabis dependence, uncomplicated: Secondary | ICD-10-CM | POA: Insufficient documentation

## 2020-05-20 DIAGNOSIS — F419 Anxiety disorder, unspecified: Secondary | ICD-10-CM | POA: Insufficient documentation

## 2020-05-20 DIAGNOSIS — F14288 Cocaine dependence with other cocaine-induced disorder: Secondary | ICD-10-CM

## 2020-05-20 DIAGNOSIS — F32A Depression, unspecified: Secondary | ICD-10-CM

## 2020-05-20 DIAGNOSIS — F1020 Alcohol dependence, uncomplicated: Secondary | ICD-10-CM | POA: Insufficient documentation

## 2020-05-20 DIAGNOSIS — F1190 Opioid use, unspecified, uncomplicated: Secondary | ICD-10-CM | POA: Insufficient documentation

## 2020-05-20 DIAGNOSIS — T7491XS Unspecified adult maltreatment, confirmed, sequela: Secondary | ICD-10-CM
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
## 2020-05-21 ENCOUNTER — Other Ambulatory Visit (HOSPITAL_COMMUNITY): Payer: BLUE CROSS/BLUE SHIELD | Admitting: Licensed Clinical Social Worker

## 2020-05-21 ENCOUNTER — Other Ambulatory Visit: Payer: Self-pay

## 2020-05-21 DIAGNOSIS — F419 Anxiety disorder, unspecified: Secondary | ICD-10-CM | POA: Diagnosis not present

## 2020-05-21 DIAGNOSIS — F1190 Opioid use, unspecified, uncomplicated: Secondary | ICD-10-CM | POA: Diagnosis not present

## 2020-05-21 DIAGNOSIS — F1994 Other psychoactive substance use, unspecified with psychoactive substance-induced mood disorder: Secondary | ICD-10-CM

## 2020-05-21 DIAGNOSIS — F1420 Cocaine dependence, uncomplicated: Secondary | ICD-10-CM | POA: Diagnosis not present

## 2020-05-21 DIAGNOSIS — F1020 Alcohol dependence, uncomplicated: Secondary | ICD-10-CM | POA: Diagnosis not present

## 2020-05-21 DIAGNOSIS — F329 Major depressive disorder, single episode, unspecified: Secondary | ICD-10-CM | POA: Diagnosis present

## 2020-05-21 DIAGNOSIS — Z6372 Alcoholism and drug addiction in family: Secondary | ICD-10-CM | POA: Diagnosis not present

## 2020-05-21 DIAGNOSIS — F1021 Alcohol dependence, in remission: Secondary | ICD-10-CM

## 2020-05-21 DIAGNOSIS — F1220 Cannabis dependence, uncomplicated: Secondary | ICD-10-CM | POA: Diagnosis not present

## 2020-05-21 DIAGNOSIS — F4310 Post-traumatic stress disorder, unspecified: Secondary | ICD-10-CM | POA: Diagnosis not present

## 2020-05-22 NOTE — Progress Notes (Signed)
    Daily Group Progress Note  Program: CD-IOP   Group Time: 1pm-2:30pm  Participation Level: Active  Behavioral Response: Appropriate and Sharing  Type of Therapy: Process Group  Topic: Clinician checked in with group members, assessing for SI/HI/psychosis and overall level of functioning including difficulties with cravings or relapse. Clinician and group members processed 'highs and lows' since last group. Clinician and group members processed finding moments of gratitude in sobriety and accepting deserving moments of happiness in recovery. Clinician and group members read AA daily reflection and NA Just for today with focus on willingness to grow and changes in values which come with recovery.        Group Time: 2:30pm-4pm  Participation Level: Active  Behavioral Response: Appropriate  Type of Therapy: Psycho-education Group  Topic: Clinician presented the psycho-educational topic of addressing Anger. Clinician and group members discussed anger as a secondary emotion and how/where expression of anger was learned and accepted growing up. Clinician and group members identified physical symptoms of 'small' anger through 'overwhelming' anger. Clinician encouraged group members to be mindful of body sensations to address uncomfortable feelings individually at a less intense level. Clinician and group members started recognizing anger triggers in different settings, and how anger is felt in the body at different intensities. Clinician utilized [facility] Understanding Anger: Tips for Managing Anger worksheet. Clinician presented [employer], focused on healthy communication during disagreements. Clinician inquired about self care activity to support recovery to be completed over the weekend.   Summary: Client checked in with a sobriety date of 05/09/20 with no 12 step meetings attended. Client explored how anger and emotions expressed growing up affected how she feels is appropriate to  show anger. Client notes she attempts to parent differently with children however identified ways in which alcohol/pills affected her behaviors in response to anger previously. Client shows progress toward goals AEB waiting to have discussions with husband if agitated, continues to struggle with using assertive communication when upset.    Family Program: Family present? No   Name of family member(s): NA  UDS collected: No Results: pending  AA/NA attended?: No  Sponsor?: No   Harlon Ditty, LCSW

## 2020-05-23 ENCOUNTER — Encounter (HOSPITAL_COMMUNITY): Payer: Self-pay | Admitting: Medical

## 2020-05-25 ENCOUNTER — Other Ambulatory Visit (HOSPITAL_COMMUNITY): Payer: BLUE CROSS/BLUE SHIELD | Admitting: Licensed Clinical Social Worker

## 2020-05-25 ENCOUNTER — Other Ambulatory Visit: Payer: Self-pay

## 2020-05-25 DIAGNOSIS — F1220 Cannabis dependence, uncomplicated: Secondary | ICD-10-CM

## 2020-05-25 DIAGNOSIS — F329 Major depressive disorder, single episode, unspecified: Secondary | ICD-10-CM | POA: Diagnosis not present

## 2020-05-25 DIAGNOSIS — F1994 Other psychoactive substance use, unspecified with psychoactive substance-induced mood disorder: Secondary | ICD-10-CM

## 2020-05-26 NOTE — Progress Notes (Signed)
    Daily Group Progress Note  Program: CD-IOP   Group Time: 1pm-2:30pm Participation Level: Active Behavioral Response: Appropriate Type of Therapy: Process Group Clinician checked in with group members, assessing for SI/HI/psychosis and overall level of functioning including difficulties with cravings or relapse. Clinician and group members processed 'highs and lows' since last group and explored with clients their effect on recovery. Clinician and group members processed Daily Reflection and Just For Today, reflecting on freedom from insanity. Clinician facilitated group processing on insanity in addiction and sanity in recovery with supporting behaviors.  Group Time: 2:30pm-4pm Participation Level: Active Behavioral Response: Appropriate Type of Therapy: Psycho-education Group Topic: Clinician presented in session a values clarification group activity focused on allowing clients to implement healthy, assertive communication skills and boundary setting based on meeting individual and group needs. Clinician prompted using assertive communication for discussion. Clinician praised clients use of willingness to listen and being open to compromise.  Clinician presented psycho-educational information on Adult Core Belief Clusters and developmental plateaus. Clinician and group members reviewed irrational and adaptive thought patterns and discussed effect of ACEs (adverse childhood experiences) on core belief about self and safety of interactions with others. Clinician inquired about self care activity to be completed prior to next group.   Summary: Client checked in with a sobriety date of 05/25/20. Client discussed with group frustration of husband's expectations of her and ultimately the need to make a decision if the relationship is healthy and needs to continue. Client shared later she did weight out consequences of smoking and delayed initial response of smoking. Client shared turning a meeting  on virtually following relapse.   Family Program: Family present? No   Name of family member(s): NA  UDS collected: Yes Results: UDS from previous week positive for [facility]  AA/NA attended?: Yes2  Sponsor?: No   Harlon Ditty, LCSW

## 2020-05-27 ENCOUNTER — Other Ambulatory Visit (HOSPITAL_COMMUNITY): Payer: BLUE CROSS/BLUE SHIELD | Admitting: Licensed Clinical Social Worker

## 2020-05-27 DIAGNOSIS — F1190 Opioid use, unspecified, uncomplicated: Secondary | ICD-10-CM

## 2020-05-27 DIAGNOSIS — F329 Major depressive disorder, single episode, unspecified: Secondary | ICD-10-CM | POA: Diagnosis not present

## 2020-05-27 DIAGNOSIS — F1994 Other psychoactive substance use, unspecified with psychoactive substance-induced mood disorder: Secondary | ICD-10-CM

## 2020-05-27 DIAGNOSIS — F1220 Cannabis dependence, uncomplicated: Secondary | ICD-10-CM

## 2020-05-27 DIAGNOSIS — F1021 Alcohol dependence, in remission: Secondary | ICD-10-CM

## 2020-05-28 ENCOUNTER — Other Ambulatory Visit (HOSPITAL_COMMUNITY): Payer: BLUE CROSS/BLUE SHIELD | Admitting: Licensed Clinical Social Worker

## 2020-05-28 ENCOUNTER — Other Ambulatory Visit: Payer: Self-pay

## 2020-05-28 DIAGNOSIS — F1994 Other psychoactive substance use, unspecified with psychoactive substance-induced mood disorder: Secondary | ICD-10-CM

## 2020-05-28 DIAGNOSIS — F329 Major depressive disorder, single episode, unspecified: Secondary | ICD-10-CM | POA: Diagnosis not present

## 2020-05-28 DIAGNOSIS — F1021 Alcohol dependence, in remission: Secondary | ICD-10-CM

## 2020-05-28 DIAGNOSIS — F1220 Cannabis dependence, uncomplicated: Secondary | ICD-10-CM

## 2020-05-28 NOTE — Progress Notes (Signed)
    Daily Group Progress Note  Program: CD-IOP   Group Time: 1pm-2pm Participation Level: Active Behavioral Response: Appropriate Type of Therapy: Psycho-education Group Topic: Psychoeducational presentation co-facilitated by Berna Spare. from [facility]. STI and HIV psychoeducation was provided to clients. clients were provided with time to ask questions and discuss local resources.    Group Time: 2pm-4pm Participation Level: Active Behavioral Response: Appropriate Type of Therapy: Process Group Topic: Process group: Clinician checked in with clients, assessing for SI/HI/psychosis and overall level of functioning including cravings, barriers to sobriety, and highlights when skills were successfully used. Clinician and group processed Daily Reflection and Just For Today with a focus on meditation and belonging as well as faith and fear. Clinician provided progressive muscle relaxation activity in session. Clinician presented review of treatment plans, goals for group, and client identified progress toward goals. Clients provided support for others' progress and solutions to roadblocks. Clinician inquired about self care activity to be completed before next group.    Summary: Client reported sobriety date of 05/25/20. Client actively listened to psycho-educational information. Client showed progress toward goals AEB attending group and engaging in discussions as well as maintaining sobriety from alcohol and opioids for around one year. Client continues to use cannabis as a coping skill for overwhelming emotions.   Family Program: Family present? No   Name of family member(s): na  UDS collected: No Results: thc  AA/NA attended?: No  Sponsor?: No   Harlon Ditty, LCSW

## 2020-06-01 ENCOUNTER — Other Ambulatory Visit: Payer: Self-pay

## 2020-06-01 ENCOUNTER — Other Ambulatory Visit (HOSPITAL_COMMUNITY): Payer: BLUE CROSS/BLUE SHIELD | Admitting: Licensed Clinical Social Worker

## 2020-06-01 DIAGNOSIS — F4312 Post-traumatic stress disorder, chronic: Secondary | ICD-10-CM

## 2020-06-01 DIAGNOSIS — F1190 Opioid use, unspecified, uncomplicated: Secondary | ICD-10-CM

## 2020-06-01 DIAGNOSIS — F1994 Other psychoactive substance use, unspecified with psychoactive substance-induced mood disorder: Secondary | ICD-10-CM

## 2020-06-01 DIAGNOSIS — F1021 Alcohol dependence, in remission: Secondary | ICD-10-CM

## 2020-06-01 DIAGNOSIS — F1220 Cannabis dependence, uncomplicated: Secondary | ICD-10-CM

## 2020-06-01 DIAGNOSIS — F329 Major depressive disorder, single episode, unspecified: Secondary | ICD-10-CM | POA: Diagnosis not present

## 2020-06-02 NOTE — Progress Notes (Signed)
    Daily Group Progress Note  Program: CD-IOP   Group Time: 1pm-2:30pm Participation Level: Active Behavioral Response: Appropriate Type of Therapy: Process Group   Topic: Clinician checked in with group members, assessing for SI/HI/psychosis and overall level of functioning. Clinician and group members processed events of the weekend, including highlights and moments of struggle. Clinician and group members processed related thoughts and feelings and coping skills used to address. Clinician and group members read and processed Daily Reflection and relation to current stage of recovery.     Group Time: 2:30pm-4pm Participation Level: Active Behavioral Response: Appropriate Type of Therapy: Psycho-education Group   Topic: Clinician provided guided relaxation skill in session. Clinician presented psycho-educational information on 'Addictive Personalities' using supporting material from The Substance Abuse and Recovery Workbook. Clinician and group members discussed personality traits relating to self-esteem, relationship dynamics, life skills, and excitement. Clinician and group members discussed changes in risk taking in recovery vs active addiction. Clinician and group members review Clear mind from previous session vs addict or clean mind. Clinician inquired a self-care activity planned before next group.    Summary: Client checked in with sobriety date of 05/30/20, sharing thoughts/feelings/behaviors which lead to relapse. Client identified anxiety about date as primary justification for using. Client reviewed addictive personality chart and verbalized changes from previous completion of activity, including and increase in self esteem and life skills which she found surprising. Client shows progress toward goals AEB continuing to engage in group however needs growth due to inability to maintain sobriety or arrive at group on time per behavior contract requirements. Client was somewhat  receptive to option of sober living which could include her children in order to separate herself from environment where there is still active addiction.   Family Program: Family present? No   Name of family member(s): NA  UDS collected: No Results: reported would be positive for [facility]  AA/NA attended?: Yes  Sponsor?: No   Harlon Ditty, LCSW

## 2020-06-03 ENCOUNTER — Other Ambulatory Visit: Payer: Self-pay

## 2020-06-03 ENCOUNTER — Other Ambulatory Visit (HOSPITAL_COMMUNITY): Payer: BLUE CROSS/BLUE SHIELD | Admitting: Licensed Clinical Social Worker

## 2020-06-03 DIAGNOSIS — F1190 Opioid use, unspecified, uncomplicated: Secondary | ICD-10-CM

## 2020-06-03 DIAGNOSIS — F419 Anxiety disorder, unspecified: Secondary | ICD-10-CM

## 2020-06-03 DIAGNOSIS — F32A Depression, unspecified: Secondary | ICD-10-CM

## 2020-06-03 DIAGNOSIS — F329 Major depressive disorder, single episode, unspecified: Secondary | ICD-10-CM | POA: Diagnosis not present

## 2020-06-03 DIAGNOSIS — F1220 Cannabis dependence, uncomplicated: Secondary | ICD-10-CM

## 2020-06-03 DIAGNOSIS — F1021 Alcohol dependence, in remission: Secondary | ICD-10-CM

## 2020-06-04 ENCOUNTER — Other Ambulatory Visit: Payer: Self-pay

## 2020-06-04 ENCOUNTER — Other Ambulatory Visit (HOSPITAL_COMMUNITY): Payer: BLUE CROSS/BLUE SHIELD | Admitting: Licensed Clinical Social Worker

## 2020-06-04 DIAGNOSIS — F1994 Other psychoactive substance use, unspecified with psychoactive substance-induced mood disorder: Secondary | ICD-10-CM

## 2020-06-04 DIAGNOSIS — F1220 Cannabis dependence, uncomplicated: Secondary | ICD-10-CM

## 2020-06-04 DIAGNOSIS — F1021 Alcohol dependence, in remission: Secondary | ICD-10-CM

## 2020-06-04 DIAGNOSIS — F1190 Opioid use, unspecified, uncomplicated: Secondary | ICD-10-CM

## 2020-06-04 DIAGNOSIS — F329 Major depressive disorder, single episode, unspecified: Secondary | ICD-10-CM | POA: Diagnosis not present

## 2020-06-04 NOTE — Progress Notes (Signed)
    Daily Group Progress Note  Program: CD-IOP   Group Time: 1-2:30pm Participation Level: Active  Behavioral Response: Appropriate Type of Therapy: Process Group   Topic: Clinician checked in with group members, assessing for SI/HI/psychosis and overall level of functioning, including cravings, relapse, and participation in community support meetings. Clinician to identify and process 'highs' and 'lows' since last group. Clinician and group members read Just for Today and Daily Reflection focused on loneliness vs connection in addiction and recovery. Clinician challenged group members when identifying patterns in triggers and create alternative people/places/things.   Group Time: 2:30-4pm Participation Level: Active Behavioral Response: Appropriate Type of Therapy: Psycho-educational Group     Clinician presented "Addiction Neuroscience 101" video from [facility]. Clinician and group members discussed scientific link between addiction, dopamine, and motivation. Clinician and group members discussed examples of changes in motivation levels in their own recovery. Clinician and group members brain stormed personal activities to engage in which increase moments of joy, stimulating the pleasure center of the brain. Clinician reviewed diagnosis criteria and decision fatigue based on information from video. Group members identified self care activity to support recovery to be completed over the weekend.  Group celebrated one [employer] graduation.   Summary: Client presented late citing appointment related to transportation. Client maintains sobriety date of 05/30/20 following relapse over the weekend. Self inventory noted moderate to severe challenging to maintain sobriety. Client shared with group changes made to daily/social activities when stopping other substances the previous year. Client was receptive to psycho-education through video and resonated with addiction not being a  moral failure which is a distortion she previously worked through.   Family Program: Family present? No   Name of family member(s): NA  UDS collected: Yes Results: pending  AA/NA attended?: No meetings  Sponsor?: No   Harlon Ditty, LCSW

## 2020-06-08 ENCOUNTER — Other Ambulatory Visit (HOSPITAL_COMMUNITY): Payer: BLUE CROSS/BLUE SHIELD | Admitting: Licensed Clinical Social Worker

## 2020-06-08 ENCOUNTER — Other Ambulatory Visit: Payer: Self-pay

## 2020-06-08 DIAGNOSIS — F1994 Other psychoactive substance use, unspecified with psychoactive substance-induced mood disorder: Secondary | ICD-10-CM

## 2020-06-08 DIAGNOSIS — F1220 Cannabis dependence, uncomplicated: Secondary | ICD-10-CM

## 2020-06-08 DIAGNOSIS — F1021 Alcohol dependence, in remission: Secondary | ICD-10-CM

## 2020-06-08 DIAGNOSIS — F1190 Opioid use, unspecified, uncomplicated: Secondary | ICD-10-CM

## 2020-06-08 DIAGNOSIS — F329 Major depressive disorder, single episode, unspecified: Secondary | ICD-10-CM | POA: Diagnosis not present

## 2020-06-09 NOTE — Progress Notes (Signed)
° ° °  Daily Group Progress Note  Program: CD-IOP   Group Time: 1pm-3pm Participation Level: Active Behavioral Response: Appropriate Type of Therapy: Process Group Topic: Clinician checked in with group members, assessing for SI/HI/psychosis and overall level of functioning including triggers, cravings, or relapse. Clinician and group members reviewed sobriety date, community meetings attended, and positive interactions as well as interactions which could have gone better and how they were addressed. Clinician facilitated process discussion on identifying and responding to internal and external early signs of relapse, such as changes in behavior patterns and working the program less, isolating more or ruminating distorted thinking. Group members provided feedback for others utilizing previously discussed topic of self-compassion and sobriety needing to be a priority and the use of justifications to avoid putting in the work. Group members discussed changes in celebration of holidays and preparing for already know and likely to come up triggers. Clinician and group members discussed the importance of boundaries and prioritizing sobriety despite the response of others. Clinician and group members read and processed daily reflection and just for today on [employer] releasing control and interference of growth of self or others.   Group Time: 3pm-4pm Participation Level: Active Behavioral Response: Appropriate Type of Therapy: Psycho-education Group Topic: Clinician reviewed DBT Mindfulness skills WHAT and HOW, discussing the importance of describing events, thoughts, and feelings in a non-judgmental manner using facts not feelings. Clinician facilitated Laughter Yoga exercise in session, encouraging clients to fully participate, non-judgmentally. Clinician allowed time for clients to provide feedback about skill. Clinician inquired about self care activity to be completed prior to next  group.   Summary: Client presented late, in violation of behavior contract. Client reported a sobriety date of 06/08/20 following relapse on marijuana in response to frustration with interactions with husband. Client reported she did not pause before responding with a different skill after ruminating on argument throughout time at work. Client states she showed progress AEB not using pills or alcohol which she had a craving for for the first time in a year and not continuing an argument after discussion was completed. Client was receptive to group feedback about the lack of healthy behaviors in relationship however remains ambivalent on actions willing to complete.   Family Program: Family present? No   Name of family member(s): NA  UDS collected: Yes Results: reported will be positive for [facility]  AA/NA attended?: Yes, 2  Sponsor?: No   Harlon Ditty, LCSW

## 2020-06-10 ENCOUNTER — Encounter (HOSPITAL_COMMUNITY): Payer: BLUE CROSS/BLUE SHIELD

## 2020-06-10 ENCOUNTER — Telehealth (HOSPITAL_COMMUNITY): Payer: Self-pay | Admitting: Licensed Clinical Social Worker

## 2020-06-10 NOTE — Progress Notes (Signed)
    Daily Group Progress Note  Program: CD-IOP   Group Time: 1pm-2:30pm  Participation Level: Active  Behavioral Response: Appropriate  Type of Therapy: Process Group  Topic:  Clinician checked in with group members, assessing for SI/HI/psychosis and overall level of functioning. Clinician and group members processed moments of success and barriers to maintaining recovery since previous group. Clinician and group members read and processed Daily Reflection and how it reflects on current place in recovery. Clinician and group members completed Meditation and processed ease or difficulty in showing kindness to self or others.        Group Time: 2:30pm-4pm  Participation Level: Active  Behavioral Response: Appropriate  Type of Therapy: Psycho-education Group  Topic: Clinician presented the topic of Kristin Neff's self-compassion and skills focused on supporting positive view of self, avoiding excessive self-criticism, and finding common humanity using gratitude and self-soothing to cope with difficult situations with limited control. Clinician and group members reviewed differences in self-esteem vs self-compassion. Group members discussed common humanity and suffering in the context of engaging with AA/NA groups. Clinician presented self-esteem journal encouraging clients to use this as a tool to identify positive moments and moments of gratitude throughout the week.   Summary: Sobriety date of 05/30/20 following relapse previous weekend. Client processed ongoing relationship dynamics and challenges of living in a home where substances are readily available. Client reports she is more able to show herself compassion currently than same time last year. Client shows progress toward goals AEB identifying 'progress not perfection' moments over th past year and increasing use of gratitude to improve mood.   Family Program: Family present? No   Name of family member(s): NA  UDS  collected: No Results: 06/03/20 UDS positive for [facility]  AA/NA attended?: No  Sponsor?: No   Harlon Ditty, LCSW

## 2020-06-15 ENCOUNTER — Encounter (HOSPITAL_COMMUNITY): Payer: BLUE CROSS/BLUE SHIELD

## 2020-06-17 ENCOUNTER — Encounter (HOSPITAL_COMMUNITY): Payer: BLUE CROSS/BLUE SHIELD

## 2020-06-29 NOTE — Progress Notes (Addendum)
° ° °  Daily Group Progress Note  Program: CD-IOP   Group Time: 1pm-2:30pm  Participation Level: Active  Behavioral Response: Appropriate  Type of Therapy: Process Group  Topic: Clinician and group members checked in with sobriety date, positive events and events which could have gone better since last group and additionally any challenges to their recovery. Clinician utilize summarizing and reflective statements and praised group engagement. Clinician and group members read and discussed AA daily reflection and JFT meditation and effect on current place in recovery.        Group Time: 2:30pm-4pm  Participation Level: Active  Behavioral Response: Appropriate  Type of Therapy: Psycho-education Group  Topic: Clinician presented the topic of vulnerability, specifically related to early recovery. Clinician presented supplemental material from Dewain Penning to process benefits and barriers to vulnerability. Clinician and group members discussed the importance of making connections with others in recovery. Clinician challenged clients to identify a positive self trait which they see as a strength. Clinician actively listened to clients, providing summarizing statements and validating feelings. Clinician inquired about self care activity planned for this day.  Summary: Client reported sobriety date of 05/25/20. Client showed progress toward goals AEB engaging in vulnerability in session and not using substances this week. Client continues to struggle with relationship with husband and the effect of this on her recovery.   Family Program: Family present? No   Name of family member(s): NA  UDS collected: No Results: UDS from 05/25/20 positive for [facility]  AA/NA attended?: No  Sponsor?: No   Harlon Ditty, LCSW

## 2020-06-30 ENCOUNTER — Telehealth (INDEPENDENT_AMBULATORY_CARE_PROVIDER_SITE_OTHER): Payer: BLUE CROSS/BLUE SHIELD | Admitting: Psychiatry

## 2020-06-30 ENCOUNTER — Encounter (HOSPITAL_COMMUNITY): Payer: Self-pay | Admitting: Psychiatry

## 2020-06-30 ENCOUNTER — Other Ambulatory Visit: Payer: Self-pay

## 2020-06-30 VITALS — Wt 230.0 lb

## 2020-06-30 DIAGNOSIS — F1021 Alcohol dependence, in remission: Secondary | ICD-10-CM | POA: Diagnosis not present

## 2020-06-30 DIAGNOSIS — F419 Anxiety disorder, unspecified: Secondary | ICD-10-CM

## 2020-06-30 DIAGNOSIS — F3131 Bipolar disorder, current episode depressed, mild: Secondary | ICD-10-CM

## 2020-06-30 DIAGNOSIS — F1220 Cannabis dependence, uncomplicated: Secondary | ICD-10-CM

## 2020-06-30 MED ORDER — ARIPIPRAZOLE 5 MG PO TABS: 5.0000 mg | ORAL_TABLET | Freq: Every day | ORAL | 2 refills | Status: DC

## 2020-06-30 MED ORDER — VENLAFAXINE HCL ER 150 MG PO CP24: ORAL_CAPSULE | ORAL | 2 refills | Status: DC

## 2020-06-30 MED ORDER — LAMOTRIGINE 150 MG PO TABS: 150.0000 mg | ORAL_TABLET | Freq: Two times a day (BID) | ORAL | 2 refills | Status: DC

## 2020-06-30 MED ORDER — NALTREXONE HCL 50 MG PO TABS: 50.0000 mg | ORAL_TABLET | Freq: Every day | ORAL | 2 refills | Status: DC

## 2020-06-30 MED ORDER — TRAZODONE HCL 100 MG PO TABS: ORAL_TABLET | ORAL | 2 refills | Status: DC

## 2020-06-30 MED ORDER — PREGABALIN 150 MG PO CAPS: 150.0000 mg | ORAL_CAPSULE | Freq: Three times a day (TID) | ORAL | 2 refills | Status: DC

## 2020-06-30 NOTE — Progress Notes (Signed)
Virtual Visit via Telephone Note  I connected with Tammy Wilkinson on 06/30/20 at  1:20 PM EST by telephone and verified that I am speaking with the correct person using two identifiers.  Location: Patient: Home Provider: Home Office   I discussed the limitations, risks, security and privacy concerns of performing an evaluation and management service by telephone and the availability of in person appointments. I also discussed with the patient that there may be a patient responsible charge related to this service. The patient expressed understanding and agreed to proceed.   History of Present Illness: Patient is evaluated by phone session.  She was fired from the CD IOP program after she missed the program and also joined the program late few times.  She is sad because she has to do the program for the court and her next hearing is on February 28.  Patient told she has to find a new program.  She admitted smoking marijuana occasionally and last use was 2 weeks ago.  She is also taking naltrexone which is helping her craving and she has not drink alcohol since August 31.  She told her job is going okay.  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  She is also sad because her marriage did not work and her husband finally decided to get separated.  Her 31-year-old daughter sometimes stays with her and sometimes with the father.  Patient told the father of 37 year old is in [place].  She told sometimes she has irritability but overall she describes her mood is good.  She is sleeping good.  She is living with her father who is supportive.  She has no rash, itching, tremors or shakes.  She denies any panic attack.  She denies any hallucination or any suicidal thoughts.  She had decided to work on Christmas so she can keep herself busy.   Past Psychiatric History:Reviewed. H/Ocutting,mood swing,mania, anger,promiscuity, abusing pain medication,cocaine and ETOH.Diagnosed ADHD,  bipolar, BPD, PTSD and anxiety. SawDr Ladona Ridgel at [place].TookEffexor, BuSpar, gabapentin,Vistaril,Prozac, Cymbalta, Seroquel, trazodone, Zoloft, Lexapro, Klonopin, Abilify, Valium, Adderall, Xanax and Klonopin. Noh/o inpatient orsuicidal attempt.DidCDIOP in Aug 2020.Given baclofen and clonidine.  Psychiatric Specialty Exam: Physical Exam  Review of Systems  Weight 230 lb (104.3 kg).There is no height or weight on file to calculate BMI.  General Appearance: NA  Eye Contact:  NA  Speech:  Normal Rate  Volume:  Decreased  Mood:  Euthymic  Affect:  NA  Thought Process:  Descriptions of Associations: Intact  Orientation:  Full (Time, Place, and Person)  Thought Content:  Rumination  Suicidal Thoughts:  No  Homicidal Thoughts:  No  Memory:  Immediate;   Good Recent;   Fair Remote;   Fair  Judgement:  Fair  Insight:  Shallow  Psychomotor Activity:  NA  Concentration:  Concentration: Fair and Attention Span: Fair  Recall:  [employer] of Knowledge:  Fair  Language:  Good  Akathisia:  No  Handed:  Right  AIMS (if indicated):     Assets:  Communication Skills Desire for Improvement Housing Transportation  ADL's:  Intact  Cognition:  WNL  Sleep:         Assessment and Plan: Bipolar disorder type I.  Alcohol dependence in early remission.  Anxiety.  Cannabis use.  Discussed noncompliance with the program.  Patient apologized but now she has to look for a different program because of the court date that requires.  She has a court date on  February 28.  Overall she described her mood is stable.  She does not want to change the medication.  She is trying to stop her marijuana and so far she has partially successful.  She feels probably she is not drinking alcohol since August.  She has no side effects.  We will continue Lyrica 150 mg 3 times a day, Abilify 5 mg daily, Lamictal 150 mg twice a day, trazodone 100 mg at bedtime and Effexor XR 150 mg daily.  Discussed polypharmacy.   At this time patient does not want to change the medication.  She is aware about the side effects.  We discussed that she may need therapy for her coping skills.  She agreed with that.  I recommend to call us back if is any question or any concern.  Follow-up in 3 months.  Follow Up Instructions:    I discussed the assessment and treatment plan with the patient. The patient was provided an opportunity to ask questions and all were answered. The patient agreed with the plan and demonstrated an understanding of the instructions.   The patient was advised to call back or seek an in-person evaluation if the symptoms worsen or if the condition fails to improve as anticipated.  I provided 19 minutes of non-face-to-face time during this encounter.   Cleotis Nipper, MD

## 2020-08-19 ENCOUNTER — Other Ambulatory Visit: Payer: BLUE CROSS/BLUE SHIELD

## 2020-09-24 ENCOUNTER — Telehealth (HOSPITAL_COMMUNITY): Payer: BLUE CROSS/BLUE SHIELD | Admitting: Psychiatry

## 2020-09-24 ENCOUNTER — Other Ambulatory Visit: Payer: Self-pay

## 2020-10-09 ENCOUNTER — Telehealth (INDEPENDENT_AMBULATORY_CARE_PROVIDER_SITE_OTHER): Payer: BLUE CROSS/BLUE SHIELD | Admitting: Psychiatry

## 2020-10-09 ENCOUNTER — Encounter (HOSPITAL_COMMUNITY): Payer: Self-pay | Admitting: Psychiatry

## 2020-10-09 ENCOUNTER — Other Ambulatory Visit: Payer: Self-pay

## 2020-10-09 DIAGNOSIS — F1220 Cannabis dependence, uncomplicated: Secondary | ICD-10-CM | POA: Diagnosis not present

## 2020-10-09 DIAGNOSIS — F3131 Bipolar disorder, current episode depressed, mild: Secondary | ICD-10-CM | POA: Diagnosis not present

## 2020-10-09 DIAGNOSIS — F1021 Alcohol dependence, in remission: Secondary | ICD-10-CM | POA: Diagnosis not present

## 2020-10-09 DIAGNOSIS — F419 Anxiety disorder, unspecified: Secondary | ICD-10-CM

## 2020-10-09 MED ORDER — PREGABALIN 150 MG PO CAPS: 150.0000 mg | ORAL_CAPSULE | Freq: Every day | ORAL | 2 refills | Status: DC

## 2020-10-09 MED ORDER — TRAZODONE HCL 100 MG PO TABS: ORAL_TABLET | ORAL | 2 refills | Status: DC

## 2020-10-09 MED ORDER — NALTREXONE HCL 50 MG PO TABS: 50.0000 mg | ORAL_TABLET | Freq: Every day | ORAL | 2 refills | Status: DC

## 2020-10-09 MED ORDER — ARIPIPRAZOLE 5 MG PO TABS: 5.0000 mg | ORAL_TABLET | Freq: Every day | ORAL | 2 refills | Status: DC

## 2020-10-09 MED ORDER — LAMOTRIGINE 150 MG PO TABS: 150.0000 mg | ORAL_TABLET | Freq: Two times a day (BID) | ORAL | 2 refills | Status: DC

## 2020-10-09 MED ORDER — VENLAFAXINE HCL ER 150 MG PO CP24: ORAL_CAPSULE | ORAL | 2 refills | Status: DC

## 2020-10-09 NOTE — Progress Notes (Signed)
Virtual Visit via Telephone Note  I connected with Tammy Wilkinson on 10/09/20 at 10:20 AM EDT by telephone and verified that I am speaking with the correct person using two identifiers.  Location: Patient: In car Provider: Home Office   I discussed the limitations, risks, security and privacy concerns of performing an evaluation and management service by telephone and the availability of in person appointments. I also discussed with the patient that there may be a patient responsible charge related to this service. The patient expressed understanding and agreed to proceed.   History of Present Illness: Patient is evaluated by phone session.  She had a court date on February 28 and charges were dismissed.  She has been working 2 jobs since tax season started.  She is working at least 60 hours a week and now looking for her own place.  Currently she is living with her father who is supportive.  She denies any mania, anger, irritability or any severe mood swings.  She has not drinking for a while but smoked marijuana 6 weeks ago.  She endorsed that being busy is good for her as she has no time to think about other things.  Her plan is to move to her own place so she can have more visitation for her 9-year-old daughter.  Her 68 year old daughter from her first relationship is living with her grandparents.  Patient liked the current medication.  She cut down her Lyrica and taking only 2 a day and so far no major concern or issue.  We have discussed polypharmacy in the past.  Her energy level is good.  Her appetite is okay.  She has no rash or itching.   Past Psychiatric History:Reviewed. H/Ocutting,mood swing,mania, anger,promiscuity, abusing pain medication,cocaine and ETOH.Diagnosed ADHD, bipolar, BPD, PTSD and anxiety. SawDr Ladona Ridgel at [place].TookEffexor, BuSpar, gabapentin,Vistaril,Prozac, Cymbalta, Seroquel, trazodone, Zoloft, Lexapro, Klonopin, Abilify, Valium, Adderall, Xanax and  Klonopin. Noh/o inpatient orsuicidal attempt.DidCDIOP in Aug 2020.Given baclofen and clonidine.  Psychiatric Specialty Exam: Physical Exam  Review of Systems  Weight 232 lb (105.2 kg).There is no height or weight on file to calculate BMI.  General Appearance: NA  Eye Contact:  NA  Speech:  Clear and Coherent  Volume:  Normal  Mood:  Euthymic  Affect:  NA  Thought Process:  Goal Directed  Orientation:  Full (Time, Place, and Person)  Thought Content:  Logical  Suicidal Thoughts:  No  Homicidal Thoughts:  No  Memory:  Immediate;   Good Recent;   Good Remote;   Good  Judgement:  Intact  Insight:  Present  Psychomotor Activity:  NA  Concentration:  Concentration: Fair and Attention Span: Fair  Recall:  Good  Fund of Knowledge:  Good  Language:  Good  Akathisia:  No  Handed:  Right  AIMS (if indicated):     Assets:  Communication Skills Desire for Improvement Housing Resilience Social Support  ADL's:  Intact  Cognition:  WNL  Sleep:   ok     Assessment and Plan: Bipolar disorder type I.  Anxiety.  Cannabis use.  Alcohol dependency in early remission.  Patient is busy working 2 jobs and denies any major concern or side effects.  She has cut down her Lyrica and only taking twice a day.  We discussed polypharmacy and recommend to try cutting down to take 1 a day.  She like to keep with her medication since they are working.  Continue Abilify 5 mg daily, Lamictal 150 mg twice a day, trazodone 100  mg at bedtime and Effexor 150 mg daily and naltrexone 50 mg daily.  We may consider cutting down further her medication on the next appointment.  Recommend to have blood work as patient do not have blood work in a while.  We will order CBC, CMP, hemoglobin A1c and TSH.  Recommended to call us back if is any question or any concern.  Follow-up in 3 months.  Follow up instructions:    I discussed the assessment and treatment plan with the patient. The patient was provided an  opportunity to ask questions and all were answered. The patient agreed with the plan and demonstrated an understanding of the instructions.   The patient was advised to call back or seek an in-person evaluation if the symptoms worsen or if the condition fails to improve as anticipated.  I provided 17 minutes of non-face-to-face time during this encounter.   Cleotis Nipper, MD

## 2021-01-05 ENCOUNTER — Other Ambulatory Visit (HOSPITAL_COMMUNITY): Payer: Self-pay | Admitting: Psychiatry

## 2021-01-05 DIAGNOSIS — F3131 Bipolar disorder, current episode depressed, mild: Secondary | ICD-10-CM

## 2021-01-11 ENCOUNTER — Encounter (HOSPITAL_COMMUNITY): Payer: Self-pay | Admitting: Psychiatry

## 2021-01-11 ENCOUNTER — Other Ambulatory Visit: Payer: Self-pay

## 2021-01-11 ENCOUNTER — Telehealth (INDEPENDENT_AMBULATORY_CARE_PROVIDER_SITE_OTHER): Payer: BLUE CROSS/BLUE SHIELD | Admitting: Psychiatry

## 2021-01-11 DIAGNOSIS — F419 Anxiety disorder, unspecified: Secondary | ICD-10-CM

## 2021-01-11 DIAGNOSIS — F1021 Alcohol dependence, in remission: Secondary | ICD-10-CM

## 2021-01-11 DIAGNOSIS — F1220 Cannabis dependence, uncomplicated: Secondary | ICD-10-CM

## 2021-01-11 DIAGNOSIS — F3131 Bipolar disorder, current episode depressed, mild: Secondary | ICD-10-CM | POA: Diagnosis not present

## 2021-01-11 MED ORDER — LAMOTRIGINE 150 MG PO TABS: 150.0000 mg | ORAL_TABLET | Freq: Two times a day (BID) | ORAL | 2 refills | Status: DC

## 2021-01-11 MED ORDER — TRAZODONE HCL 100 MG PO TABS: ORAL_TABLET | ORAL | 2 refills | Status: DC

## 2021-01-11 MED ORDER — ARIPIPRAZOLE 5 MG PO TABS: 5.0000 mg | ORAL_TABLET | Freq: Every day | ORAL | 2 refills | Status: DC

## 2021-01-11 MED ORDER — VENLAFAXINE HCL ER 150 MG PO CP24
ORAL_CAPSULE | ORAL | 2 refills | Status: DC
Start: 2021-01-11 — End: 2021-04-27

## 2021-01-11 MED ORDER — NALTREXONE HCL 50 MG PO TABS: 50.0000 mg | ORAL_TABLET | Freq: Every day | ORAL | 2 refills | Status: DC

## 2021-01-11 MED ORDER — PREGABALIN 150 MG PO CAPS: 150.0000 mg | ORAL_CAPSULE | Freq: Every day | ORAL | 2 refills | Status: DC

## 2021-01-11 NOTE — Progress Notes (Signed)
Virtual Visit via Telephone Note  I connected with Tammy Wilkinson on 01/11/21 at  2:00 PM EDT by telephone and verified that I am speaking with the correct person using two identifiers.  Location: Patient: Work Provider: Economist   I discussed the limitations, risks, security and privacy concerns of performing an evaluation and management service by telephone and the availability of in person appointments. I also discussed with the patient that there may be a patient responsible charge related to this service. The patient expressed understanding and agreed to proceed.   History of Present Illness: Patient is evaluated by phone session.  Patient told recently her 24 year old oldest sister who was living in [place] and died 2 weeks ago due to pulmonary embolism.  She had to go to attend the funeral services and just came back and today is her first day back to work.  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  Patient told her job is very tiring and did a lot of walking and she had to stand at least 10 hours a day.  She had lost more than 10 pounds.  She endorsed her back pain is coming back.  She is sad about losing her sister.  She had now only 1 sister and brother remaining in the living [place].  Patient is still living with her father as not able to afford the rent.  Lately patient having some cough which she believes acquired through her daughter.  We have cut down her Lyrica on the last visit as she was taking too many medication.  Patient like to go back on higher dose of Lyrica because she noticed not sleeping as good and having increased back pain.  However she also realized that she is not taking full dose of trazodone.  She is compliant with Effexor, Abilify, Lamictal but also not consistently taking naltrexone.  She feels proud that she is not drinking or smoking marijuana for more than 3 months.  She is trying to focus on her job and saving money so  she can have her own place.  Patient denies any mania, psychosis, hallucination.  She denies any suicidal thoughts or homicidal thoughts.   Past Psychiatric History: Reviewed. H/O cutting, mood swing, mania, anger, promiscuity, abusing pain medication, cocaine and ETOH. Diagnosed ADHD, bipolar, BPD, PTSD and anxiety. Saw Dr Ladona Ridgel at [place].  Took Effexor, BuSpar, gabapentin, Vistaril, Prozac, Cymbalta, Seroquel, trazodone, Zoloft, Lexapro, Klonopin, Abilify, Valium, Adderall, Xanax and Klonopin.  No h/o inpatient or suicidal attempt.  Did CDIOP in Aug 2020. Given baclofen and clonidine.   Psychiatric Specialty Exam: Physical Exam  Review of Systems  Weight 217 lb (98.4 kg).There is no height or weight on file to calculate BMI.  General Appearance: NA  Eye Contact:  NA  Speech:  Clear and Coherent  Volume:  Normal  Mood:  Dysphoric  Affect:  NA  Thought Process:  Descriptions of Associations: Intact  Orientation:  Full (Time, Place, and Person)  Thought Content:  WDL  Suicidal Thoughts:  No  Homicidal Thoughts:  No  Memory:  Immediate;   Good Recent;   Good Remote;   Good  Judgement:  Intact  Insight:  Present  Psychomotor Activity:  NA  Concentration:  Concentration: Fair and Attention Span: Fair  Recall:  Good  Fund of Knowledge:  Good  Language:  Good  Akathisia:  No  Handed:  Right  AIMS (if indicated):     Assets:  Communication  Skills Desire for Improvement Housing Resilience Transportation  ADL's:  Intact  Cognition:  WNL  Sleep:   fair      Assessment and Plan: Bipolar disorder type I.  Anxiety.  Cannabis use, alcohol dependency in early remission.  Condolences given about losing her sister who died due to pulmonary embolism.  I offered grief counseling but patient not interested.  I discussed about her current medication.  I recommend she need to take the full dose of trazodone so she can sleep better.  I gave her option either she can take the higher dose of  Lyrica and to stop the trazodone or take the full dose of trazodone so she can sleep better.  She is still taking multiple medication and she agreed with the plan.  I also reminded that she need to be consistent with naltrexone to remain in remission from alcohol and cannabis use.  I also reminded that she need to have a blood work which she forgot to do.  We will keep the same medication which is Abilify 5 mg daily, Lamictal 150 mg twice a day, she will take the full dose of trazodone 100 mg and Effexor 150 mg daily.  She will also keep the naltrexone 50 mg to take as needed and Lyrica 150 mg daily.  Recommended to call us back if there is any question or any concern.  Follow-up in 3 months.    Follow Up Instructions:    I discussed the assessment and treatment plan with the patient. The patient was provided an opportunity to ask questions and all were answered. The patient agreed with the plan and demonstrated an understanding of the instructions.   The patient was advised to call back or seek an in-person evaluation if the symptoms worsen or if the condition fails to improve as anticipated.  I provided 19 minutes of non-face-to-face time during this encounter.   Cleotis Nipper, MD

## 2021-04-13 ENCOUNTER — Telehealth (HOSPITAL_COMMUNITY): Payer: BLUE CROSS/BLUE SHIELD | Admitting: Psychiatry

## 2021-04-21 ENCOUNTER — Other Ambulatory Visit (HOSPITAL_COMMUNITY): Payer: Self-pay | Admitting: Psychiatry

## 2021-04-21 DIAGNOSIS — F3131 Bipolar disorder, current episode depressed, mild: Secondary | ICD-10-CM

## 2021-04-27 ENCOUNTER — Encounter (HOSPITAL_COMMUNITY): Payer: Self-pay | Admitting: Psychiatry

## 2021-04-27 ENCOUNTER — Other Ambulatory Visit (HOSPITAL_COMMUNITY): Payer: Self-pay

## 2021-04-27 ENCOUNTER — Telehealth (HOSPITAL_BASED_OUTPATIENT_CLINIC_OR_DEPARTMENT_OTHER): Payer: 59 | Admitting: Psychiatry

## 2021-04-27 ENCOUNTER — Other Ambulatory Visit: Payer: Self-pay

## 2021-04-27 DIAGNOSIS — Z79899 Other long term (current) drug therapy: Secondary | ICD-10-CM

## 2021-04-27 DIAGNOSIS — F419 Anxiety disorder, unspecified: Secondary | ICD-10-CM

## 2021-04-27 DIAGNOSIS — F3131 Bipolar disorder, current episode depressed, mild: Secondary | ICD-10-CM

## 2021-04-27 DIAGNOSIS — F1220 Cannabis dependence, uncomplicated: Secondary | ICD-10-CM | POA: Diagnosis not present

## 2021-04-27 DIAGNOSIS — F1021 Alcohol dependence, in remission: Secondary | ICD-10-CM | POA: Diagnosis not present

## 2021-04-27 MED ORDER — VENLAFAXINE HCL ER 150 MG PO CP24: ORAL_CAPSULE | ORAL | 2 refills | Status: DC

## 2021-04-27 MED ORDER — LAMOTRIGINE 150 MG PO TABS: 150.0000 mg | ORAL_TABLET | Freq: Two times a day (BID) | ORAL | 2 refills | Status: DC

## 2021-04-27 MED ORDER — PREGABALIN 150 MG PO CAPS: 150.0000 mg | ORAL_CAPSULE | Freq: Every day | ORAL | 2 refills | Status: DC

## 2021-04-27 MED ORDER — NALTREXONE HCL 50 MG PO TABS: 50.0000 mg | ORAL_TABLET | Freq: Every day | ORAL | 2 refills | Status: DC

## 2021-04-27 MED ORDER — ARIPIPRAZOLE 5 MG PO TABS: 5.0000 mg | ORAL_TABLET | Freq: Every day | ORAL | 2 refills | Status: DC

## 2021-04-27 MED ORDER — TRAZODONE HCL 100 MG PO TABS: ORAL_TABLET | ORAL | 2 refills | Status: DC

## 2021-04-27 NOTE — Progress Notes (Signed)
Virtual Visit via Telephone Note  I connected with Tammy Wilkinson on 04/27/21 at  2:00 PM EDT by telephone and verified that I am speaking with the correct person using two identifiers.  Location: Patient: Work Provider: Economist   I discussed the limitations, risks, security and privacy concerns of performing an evaluation and management service by telephone and the availability of in person appointments. I also discussed with the patient that there may be a patient responsible charge related to this service. The patient expressed understanding and agreed to proceed.   History of Present Illness: Patient is evaluated by phone session.  She is taking all her medication and she feels things are going well.  She cut down Lyrica further and only taking 1 a day and taking the full dose of trazodone but is helping her sleep.  She admitted few times smoke marijuana when she was under stress and really the loss of her sister who died 3 months ago due to pulmonary embolism in [place].  Patient told since then she has been not smoking marijuana and back on naltrexone but is also helping her to remain sober from drugs and alcohol.  She lost more than 15 pounds as she is walking a lot and watching her calorie intake.  It could be due to cutting down the Lyrica.  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  She is trying to save money and living with her father who is very cooperative.  She denies any mania, psychosis, hallucination.  She denies any highs or lows or any anger.  She apologized not having blood work but now she has insurance and trying to set up an appointment with the PCP.    Past Psychiatric History: Reviewed. H/O cutting, mood swing, mania, anger, promiscuity, abusing pain medication, cocaine and ETOH. Diagnosed ADHD, bipolar, BPD, PTSD and anxiety. Saw Dr Ladona Ridgel at [place].  Took Effexor, BuSpar,  gabapentin, Vistaril, Prozac, Cymbalta, Seroquel, trazodone, Zoloft, Lexapro, Klonopin, Abilify, Valium, Adderall, Xanax and Klonopin.  No h/o inpatient or suicidal attempt.  Did CDIOP in Aug 2020. Given baclofen and clonidine.   Psychiatric Specialty Exam: Physical Exam  Review of Systems  Weight 200 lb (90.7 kg).There is no height or weight on file to calculate BMI.  General Appearance: NA  Eye Contact:  NA  Speech:  Normal Rate  Volume:  Normal  Mood:  Euthymic  Affect:  NA  Thought Process:  Goal Directed  Orientation:  Full (Time, Place, and Person)  Thought Content:  WDL  Suicidal Thoughts:  No  Homicidal Thoughts:  No  Memory:  Immediate;   Good Recent;   Good Remote;   Good  Judgement:  Fair  Insight:  Shallow  Psychomotor Activity:  NA  Concentration:  Concentration: Fair and Attention Span: Fair  Recall:  Good  Fund of Knowledge:  Good  Language:  Good  Akathisia:  No  Handed:  Right  AIMS (if indicated):     Assets:  Communication Skills Desire for Improvement Housing Resilience Social Support  ADL's:  Intact  Cognition:  WNL  Sleep:   ok      Assessment and Plan: Bipolar disorder type I.  Anxiety.  Cannabis use.  Alcohol dependency in remission.  Patient is stable on her current medication other than occasional use of marijuana but claims to be sober for past 2 months.  She is feeling  better since cut down the Lyrica and taking the full dose of trazodone.  She lost weight from the past.  I emphasized that she need to have the labs as she is taking multiple medication and no labs have been drawn recently.  Patient has insurance and she is trying to get appointment to see the PCP.  We will order CBC, CMP, hemoglobin A1c and TSH.  Continue Lamictal 150 mg twice a day, Abilify 5 mg daily, trazodone 100 mg at bedtime, Effexor 150 mg daily, naltrexone 50 mg daily and Lyrica 150 mg. Follow up in three months.  Follow Up Instructions:    I discussed the  assessment and treatment plan with the patient. The patient was provided an opportunity to ask questions and all were answered. The patient agreed with the plan and demonstrated an understanding of the instructions.   The patient was advised to call back or seek an in-person evaluation if the symptoms worsen or if the condition fails to improve as anticipated.  I provided 22 minutes of non-face-to-face time during this encounter.   Cleotis Nipper, MD

## 2021-07-05 ENCOUNTER — Telehealth (HOSPITAL_COMMUNITY): Payer: Self-pay | Admitting: *Deleted

## 2021-07-05 NOTE — Telephone Encounter (Signed)
Writer left VM for pt in response to pt message regarding which [facility] orders were sent to. Writer advised it was the [facility] @ [address], [address]. Pt encouraged to call with any further questions.

## 2021-07-22 ENCOUNTER — Other Ambulatory Visit (HOSPITAL_COMMUNITY): Payer: Self-pay | Admitting: Psychiatry

## 2021-07-22 DIAGNOSIS — F3131 Bipolar disorder, current episode depressed, mild: Secondary | ICD-10-CM

## 2021-07-27 ENCOUNTER — Encounter (HOSPITAL_COMMUNITY): Payer: Self-pay | Admitting: Psychiatry

## 2021-07-27 ENCOUNTER — Telehealth (HOSPITAL_BASED_OUTPATIENT_CLINIC_OR_DEPARTMENT_OTHER): Payer: 59 | Admitting: Psychiatry

## 2021-07-27 ENCOUNTER — Other Ambulatory Visit: Payer: Self-pay

## 2021-07-27 DIAGNOSIS — F419 Anxiety disorder, unspecified: Secondary | ICD-10-CM

## 2021-07-27 DIAGNOSIS — F1021 Alcohol dependence, in remission: Secondary | ICD-10-CM

## 2021-07-27 DIAGNOSIS — F3131 Bipolar disorder, current episode depressed, mild: Secondary | ICD-10-CM

## 2021-07-27 DIAGNOSIS — F1220 Cannabis dependence, uncomplicated: Secondary | ICD-10-CM

## 2021-07-27 MED ORDER — ARIPIPRAZOLE 5 MG PO TABS: 5.0000 mg | ORAL_TABLET | Freq: Every day | ORAL | 0 refills | Status: DC

## 2021-07-27 MED ORDER — PREGABALIN 150 MG PO CAPS: 150.0000 mg | ORAL_CAPSULE | Freq: Every day | ORAL | 2 refills | Status: DC

## 2021-07-27 MED ORDER — VENLAFAXINE HCL ER 150 MG PO CP24: ORAL_CAPSULE | ORAL | 0 refills | Status: DC

## 2021-07-27 MED ORDER — LAMOTRIGINE 150 MG PO TABS: 150.0000 mg | ORAL_TABLET | Freq: Two times a day (BID) | ORAL | 2 refills | Status: DC

## 2021-07-27 MED ORDER — TRAZODONE HCL 100 MG PO TABS: ORAL_TABLET | ORAL | 2 refills | Status: DC

## 2021-07-27 MED ORDER — NALTREXONE HCL 50 MG PO TABS: 50.0000 mg | ORAL_TABLET | Freq: Every day | ORAL | 2 refills | Status: DC
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
## 2021-07-31 LAB — COMPREHENSIVE METABOLIC PANEL
ALT: 44 IU/L — ABNORMAL HIGH (ref 0–32)
AST: 36 IU/L (ref 0–40)
Albumin/Globulin Ratio: 2 (ref 1.2–2.2)
Albumin: 4.7 g/dL (ref 3.8–4.8)
Alkaline Phosphatase: 71 IU/L (ref 44–121)
BUN/Creatinine Ratio: 5 — ABNORMAL LOW (ref 9–23)
BUN: 4 mg/dL — ABNORMAL LOW (ref 6–20)
Bilirubin Total: <0.2 mg/dL (ref 0.0–1.2)
CO2: 22 mmol/L (ref 20–29)
Calcium: 10.3 mg/dL — ABNORMAL HIGH (ref 8.7–10.2)
Chloride: 99 mmol/L (ref 96–106)
Creatinine, Ser: 0.87 mg/dL (ref 0.57–1.00)
Globulin, Total: 2.4 g/dL (ref 1.5–4.5)
Glucose: 99 mg/dL (ref 70–99)
Potassium: 4.3 mmol/L (ref 3.5–5.2)
Sodium: 139 mmol/L (ref 134–144)
Total Protein: 7.1 g/dL (ref 6.0–8.5)
eGFR: 90 mL/min/{1.73_m2} (ref >59)

## 2021-07-31 LAB — CBC WITH DIFFERENTIAL
Basophils Absolute: 0.1 10*3/uL (ref 0.0–0.2)
Basos: 1 %
EOS (ABSOLUTE): 0.2 10*3/uL (ref 0.0–0.4)
Eos: 3 %
Hematocrit: 44.5 % (ref 34.0–46.6)
Hemoglobin: 15.4 g/dL (ref 11.1–15.9)
Immature Grans (Abs): 0 10*3/uL (ref 0.0–0.1)
Immature Granulocytes: 0 %
Lymphocytes Absolute: 5.2 10*3/uL — ABNORMAL HIGH (ref 0.7–3.1)
Lymphs: 56 %
MCH: 33.1 pg — ABNORMAL HIGH (ref 26.6–33.0)
MCHC: 34.6 g/dL (ref 31.5–35.7)
MCV: 96 fL (ref 79–97)
Monocytes Absolute: 0.7 10*3/uL (ref 0.1–0.9)
Monocytes: 8 %
Neutrophils Absolute: 2.9 10*3/uL (ref 1.4–7.0)
Neutrophils: 32 %
RBC: 4.65 x10E6/uL (ref 3.77–5.28)
RDW: 12.8 % (ref 11.7–15.4)
WBC: 9.1 10*3/uL (ref 3.4–10.8)

## 2021-07-31 LAB — HEMOGLOBIN A1C
Est. average glucose Bld gHb Est-mCnc: 105 mg/dL
Hgb A1c MFr Bld: 5.3 % (ref 4.8–5.6)

## 2021-07-31 LAB — TSH: TSH: 2.94 u[IU]/mL (ref 0.450–4.500)

## 2021-08-02 ENCOUNTER — Telehealth (HOSPITAL_COMMUNITY): Payer: Self-pay | Admitting: *Deleted

## 2021-08-02 NOTE — Telephone Encounter (Signed)
Lab results received from [facility] resulted on 07/31/21. Pt MCH high @33 .1, Lymphs ( Absolute) high @ 5.2, BUN low @ 4, BUN/Creatinine also low @ 5.Calcium level high @ 10.3, ALT high  @44 . Pt next appointment 10/26/21. No PCP per chart.

## 2021-08-26 ENCOUNTER — Other Ambulatory Visit (HOSPITAL_COMMUNITY): Payer: Self-pay | Admitting: *Deleted

## 2021-08-26 ENCOUNTER — Telehealth (HOSPITAL_COMMUNITY): Payer: Self-pay | Admitting: *Deleted

## 2021-08-26 DIAGNOSIS — F419 Anxiety disorder, unspecified: Secondary | ICD-10-CM

## 2021-08-26 DIAGNOSIS — F3131 Bipolar disorder, current episode depressed, mild: Secondary | ICD-10-CM

## 2021-08-26 MED ORDER — PREGABALIN 150 MG PO CAPS: 150.0000 mg | ORAL_CAPSULE | Freq: Every day | ORAL | 1 refills | Status: DC

## 2021-08-26 MED ORDER — VENLAFAXINE HCL ER 150 MG PO CP24: ORAL_CAPSULE | ORAL | 0 refills | Status: DC

## 2021-08-26 NOTE — Telephone Encounter (Signed)
Effexor and Lyrica transferred to [facility] [address] in [place] per pt request. Pt former pharmacy [facility] in [place], confirmed pt last fill of Lyrica 150 mg was on 07/08/21. FYI.

## 2021-08-27 ENCOUNTER — Encounter (HOSPITAL_COMMUNITY): Payer: Self-pay | Admitting: Emergency Medicine

## 2021-08-27 ENCOUNTER — Ambulatory Visit (INDEPENDENT_AMBULATORY_CARE_PROVIDER_SITE_OTHER): Payer: 59

## 2021-08-27 ENCOUNTER — Other Ambulatory Visit: Payer: Self-pay

## 2021-08-27 ENCOUNTER — Ambulatory Visit (HOSPITAL_COMMUNITY)
Admission: EM | Admit: 2021-08-27 | Discharge: 2021-08-27 | Disposition: A | Discharge status: Home / Self Care | Payer: 59 | Attending: Student | Admitting: Student

## 2021-08-27 DIAGNOSIS — M7071 Other bursitis of hip, right hip: Secondary | ICD-10-CM | POA: Diagnosis not present

## 2021-08-27 DIAGNOSIS — M25551 Pain in right hip: Secondary | ICD-10-CM

## 2021-08-27 DIAGNOSIS — R52 Pain, unspecified: Secondary | ICD-10-CM

## 2021-08-27 MED ORDER — METHYLPREDNISOLONE SODIUM SUCC 125 MG IJ SOLR
60.0000 mg | Freq: Once | INTRAMUSCULAR | Status: AC
  Administered 2021-08-27: 60 mg via INTRAMUSCULAR

## 2021-08-27 MED ORDER — MELOXICAM 7.5 MG PO TABS: 7.5000 mg | ORAL_TABLET | Freq: Every day | ORAL | 0 refills | Status: DC

## 2021-08-27 MED ORDER — METHYLPREDNISOLONE SODIUM SUCC 125 MG IJ SOLR
INTRAMUSCULAR | Status: AC
  Filled 2021-08-27: qty 2

## 2021-08-27 NOTE — ED Provider Notes (Signed)
[facility]    CSN: [id number] Arrival date & time: 08/27/21  1814      History   Chief Complaint Chief Complaint  Patient presents with   Hip Pain    right    HPI Tammy Wilkinson is a 36 y.o. female presenting with R hip pain x4 months, getting worse. Medical history R trochanteric bursitis and bilateral hip fracture 2016 per pt. Denies trauma or overuse. States pain is localized to R hip and radiates halfway down anterior R thigh. Denies groin pain. Denies back pain or radiation of pain down posterior leg. Denies numbness or weakness in legs. States heating pad and ibuprofen provide some relief. States she has had trochanteric bursitis in the past and has required injections for this.   HPI  Past Medical History:  Diagnosis Date   ADHD (attention deficit hyperactivity disorder)    Allergy    Anemia    Anxiety    Asthma    pt states when she was a child   Concussion 12/30/14   Depression    Headache(784.0)    Hx; of migraines   Heartburn    hx:   Pneumonia    Shortness of breath     Patient Active Problem List   Diagnosis Date Noted   Opioid abuse, episodic use (HCC) 05/15/2019   Cannabis dependence, daily use (HCC) 04/23/2019   Alcohol use disorder, severe, dependence (HCC) 03/12/2019   Anxiety and depression 04/27/2014    Past Surgical History:  Procedure Laterality Date   LARYNGOPLASTY Left 03/29/2013   Procedure: VOCAL CORD MEDIALIZATION;  Surgeon: Serena Colonel, MD;  Location: [facility] OR;  Service: ENT;  Laterality: Left;  with placement of silastic block   WISDOM TOOTH EXTRACTION      OB History     Gravida  2   Para  2   Term  2   Preterm      AB      Living  2      SAB      IAB      Ectopic      Multiple      Live Births  2            Home Medications    Prior to Admission medications   Medication Sig Start Date End Date Taking? Authorizing Provider  meloxicam (MOBIC) 7.5 MG tablet Take 1 tablet (7.5 mg total)  by mouth daily. Can increase to twice daily if necessary 08/27/21  Yes Rhys Martini, PA-C  albuterol (VENTOLIN HFA) 108 (90 Base) MCG/ACT inhaler Inhale into the lungs. 12/02/18   [provider]  ARIPiprazole (ABILIFY) 5 MG tablet Take 1 tablet (5 mg total) by mouth daily. 07/27/21   Arfeen, Phillips Grout, MD  etonogestrel (NEXPLANON) 68 MG IMPL implant Inject into the skin.    [provider]  ibuprofen (ADVIL) 800 MG tablet  05/14/19   [provider]  lamoTRIgine (LAMICTAL) 150 MG tablet Take 1 tablet (150 mg total) by mouth 2 (two) times daily. 07/27/21 10/25/21  Arfeen, Phillips Grout, MD  naltrexone (DEPADE) 50 MG tablet Take 1 tablet (50 mg total) by mouth daily. 07/27/21   Arfeen, Phillips Grout, MD  omeprazole (PRILOSEC) 10 MG capsule Take by mouth.    [provider]  pregabalin (LYRICA) 150 MG capsule Take 1 capsule (150 mg total) by mouth daily. 08/26/21   Cleotis Nipper, MD  SUMAtriptan Willette Brace) 20 MG/ACT nasal spray  12/27/19   [provider]  traZODone (DESYREL) 100 MG tablet TAKE 1 TABLET(100 MG) BY MOUTH AT BEDTIME 07/27/21   Arfeen, Phillips GroutSyed T, MD  venlafaxine XR (EFFEXOR-XR) 150 MG 24 hr capsule TAKE 1 CAPSULE(150 MG) BY MOUTH DAILY WITH BREAKFAST 08/26/21   Arfeen, Phillips GroutSyed T, MD    Family History Family History  Problem Relation Age of Onset   Hypertension Mother    Anxiety disorder Mother    Depression Mother    Physical abuse Mother    Hypertension Father    Drug abuse Sister    Anxiety disorder Sister    Depression Sister    Bipolar disorder Sister    Sexual abuse Sister    Alcohol abuse Brother    Drug abuse Brother    Depression Brother    Anxiety disorder Brother    ADD / ADHD Other    ADD / ADHD Other    Schizophrenia Sister    Sexual abuse Sister    ADD / ADHD Sister    Depression Sister    Sexual abuse Sister    Drug abuse Sister    Anxiety disorder Sister    Depression Sister    Drug abuse Sister    Anesthesia problems Neg Hx      Social History Social History   Tobacco Use   Smoking status: Every Day    Packs/day: 0.50    Types: Cigarettes    Last attempt to quit: 08/25/2013    Years since quitting: 8.0   Smokeless tobacco: Never  Vaping Use   Vaping Use: Never used  Substance Use Topics   Alcohol use: Yes    Alcohol/week: 2.0 standard drinks    Types: 2 Cans of beer per week    Comment: rare   Drug use: No     Allergies   Latex, Codeine, Amoxil [amoxicillin], and Penicillins   Review of Systems Review of Systems  Musculoskeletal:        R hip pain  All other systems reviewed and are negative.   Physical Exam Triage Vital Signs ED Triage Vitals [08/27/21 1837]  Enc Vitals Group     BP      Pulse      Resp      Temp      Temp src      SpO2      Weight 207 lb 14.3 oz (94.3 kg)     Height 5\' 4"  (1.626 m)     Head Circumference      Peak Flow      Pain Score 6     Pain Loc      Pain Edu?      Excl. in GC?    No data found.  Updated Vital Signs BP 125/82 (BP Location: Left Arm)    Pulse (!) 104    Temp 98.2 F (36.8 C) (Oral)    Resp 17    Ht 5\' 4"  (1.626 m)    Wt 207 lb 14.3 oz (94.3 kg)    SpO2 95%    BMI 35.68 kg/m   Visual Acuity Right Eye Distance:   Left Eye Distance:   Bilateral Distance:    Right Eye Near:   Left Eye Near:    Bilateral Near:     Physical Exam Vitals reviewed.  Constitutional:      General: She is not in acute distress.    Appearance: Normal appearance. She is not ill-appearing.  HENT:     Head: Normocephalic and  atraumatic.  Pulmonary:     Effort: Pulmonary effort is normal.  Musculoskeletal:     Comments: R hip - TTP overlying humeral head. Pain elicited with abduction. Flexion and extension intact and with minimal pain. Strength and sensation grossly intact. No midline spinous tenderness, deformity, stepoff. No paraspinous tenderness.   Neurological:     General: No focal deficit present.     Mental Status: She is alert and oriented to  person, place, and time.  Psychiatric:        Mood and Affect: Mood normal.        Behavior: Behavior normal.        Thought Content: Thought content normal.        Judgment: Judgment normal.     UC Treatments / Results  Labs (all labs ordered are listed, but only abnormal results are displayed) Labs Reviewed - No data to display  EKG   Radiology DG Hip Unilat With Pelvis 2-3 Views Right  Result Date: 08/27/2021 CLINICAL DATA:  Right hip pain over the last 4 months. EXAM: DG HIP (WITH OR WITHOUT PELVIS) 2-3V RIGHT COMPARISON:  04/27/2006 FINDINGS: Bones of the pelvis appear normal. Both hip joints are normal. No joint space narrowing. No evidence of avascular necrosis or other focal bone lesion. No sign a previous fracture based on these images. There does appear to be mild spinal curvature convex to the left. Pelvic tilt could simply be positional, but leg-length discrepancy is not excluded. IMPRESSION: No evidence of arthritis. No post traumatic finding. Some lumbar spinal curvature. Pelvic tilt which could be positional or evidence of leg length discrepancy. Electronically Signed   By: Paulina Fusi M.D.   On: 08/27/2021 19:38    Procedures Procedures (including critical care time)  Medications Ordered in UC Medications  methylPREDNISolone sodium succinate (SOLU-MEDROL) 125 mg/2 mL injection 60 mg (has no administration in time range)    Initial Impression / Assessment and Plan / UC Course  I have reviewed the triage vital signs and the nursing notes.  Pertinent labs & imaging results that were available during my care of the patient were reviewed by me and considered in my medical decision making (see chart for details).     This patient is a very pleasant 37 y.o. year old female presenting with R hip pain - suspect bursitis. Neurovascularly intact. No trauma or overuse.  Patient is adamant that she has fractured both of her hips in the past, I see no evidence of this on chart  review. States she is not pregnant or breastfeeding, declines u-preg.   Xray R hip -No evidence of arthritis. No post traumatic finding. Some lumbar spinal curvature. Pelvic tilt which could be positional or evidence of leg length discrepancy.  IM solumedrol administered today. Meloxicam sent. Crutches provided at patient request.   Has followed with ortho in the past for this issue. F/u with them if symptoms persist.  ED return precautions discussed. Patient verbalizes understanding and agreement.   Final Clinical Impressions(s) / UC Diagnoses   Final diagnoses:  Pain  Bursitis of other bursa of right hip     Discharge Instructions      .Meloxicam up to twice daily. Take with food. Avoid other NSAIDs while taking this medication .  Follow-up with orthopedist if symptoms persist.     ED Prescriptions     Medication Sig Dispense Auth. Provider   meloxicam (MOBIC) 7.5 MG tablet Take 1 tablet (7.5 mg total) by mouth daily. Can increase  to twice daily if necessary 21 tablet Rhys Martini, PA-C      PDMP not reviewed this encounter.   Rhys Martini, PA-C 08/27/21 1947
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
## 2021-08-27 NOTE — Discharge Instructions (Addendum)
.  Meloxicam up to twice daily. Take with food. Avoid other NSAIDs while taking this medication .  Follow-up with orthopedist if symptoms persist.

## 2021-09-19 ENCOUNTER — Other Ambulatory Visit (HOSPITAL_COMMUNITY): Payer: Self-pay | Admitting: Psychiatry

## 2021-09-19 DIAGNOSIS — F3131 Bipolar disorder, current episode depressed, mild: Secondary | ICD-10-CM

## 2021-09-28 ENCOUNTER — Other Ambulatory Visit (HOSPITAL_COMMUNITY): Payer: Self-pay | Admitting: *Deleted

## 2021-09-28 DIAGNOSIS — F3131 Bipolar disorder, current episode depressed, mild: Secondary | ICD-10-CM

## 2021-09-28 MED ORDER — VENLAFAXINE HCL ER 150 MG PO CP24: ORAL_CAPSULE | ORAL | 0 refills | Status: DC

## 2021-10-14 ENCOUNTER — Telehealth (HOSPITAL_COMMUNITY): Payer: Self-pay

## 2021-10-14 DIAGNOSIS — F3131 Bipolar disorder, current episode depressed, mild: Secondary | ICD-10-CM

## 2021-10-14 MED ORDER — ARIPIPRAZOLE 5 MG PO TABS: 5.0000 mg | ORAL_TABLET | Freq: Every day | ORAL | 0 refills | Status: DC

## 2021-10-14 NOTE — Telephone Encounter (Signed)
I sent Abilify to [facility] at [place]. ?

## 2021-10-14 NOTE — Telephone Encounter (Signed)
NOTIFIED PATIENT °

## 2021-10-14 NOTE — Telephone Encounter (Signed)
Received a call from pt and a fax from [facility] on [address] in [place] requesting a refill on pt's Aripiprazole (Abilify) 5mg . Pt has followup appointment scheduled for 4/11. Please review and advise. Thank you ?

## 2021-10-26 ENCOUNTER — Telehealth (HOSPITAL_BASED_OUTPATIENT_CLINIC_OR_DEPARTMENT_OTHER): Payer: Self-pay | Admitting: Psychiatry

## 2021-10-26 ENCOUNTER — Encounter (HOSPITAL_COMMUNITY): Payer: Self-pay | Admitting: Psychiatry

## 2021-10-26 VITALS — Wt 211.0 lb

## 2021-10-26 DIAGNOSIS — F3131 Bipolar disorder, current episode depressed, mild: Secondary | ICD-10-CM

## 2021-10-26 DIAGNOSIS — F1021 Alcohol dependence, in remission: Secondary | ICD-10-CM

## 2021-10-26 DIAGNOSIS — F419 Anxiety disorder, unspecified: Secondary | ICD-10-CM

## 2021-10-26 MED ORDER — PREGABALIN 150 MG PO CAPS: 150.0000 mg | ORAL_CAPSULE | Freq: Every day | ORAL | 1 refills | Status: DC

## 2021-10-26 MED ORDER — LAMOTRIGINE 150 MG PO TABS: 150.0000 mg | ORAL_TABLET | Freq: Two times a day (BID) | ORAL | 2 refills | Status: DC

## 2021-10-26 MED ORDER — VENLAFAXINE HCL ER 150 MG PO CP24: ORAL_CAPSULE | ORAL | 0 refills | Status: DC

## 2021-10-26 MED ORDER — ARIPIPRAZOLE 5 MG PO TABS: 5.0000 mg | ORAL_TABLET | Freq: Every day | ORAL | 0 refills | Status: DC

## 2021-10-26 MED ORDER — TRAZODONE HCL 100 MG PO TABS: ORAL_TABLET | ORAL | 2 refills | Status: AC

## 2021-10-26 NOTE — Progress Notes (Signed)
Virtual Visit via Telephone Note ? ?I connected with Tammy Wilkinson on [birth year] at  2:20 PM EDT by telephone and verified that I am speaking with the correct person using two identifiers. ? ?Location: ?Patient: Home ?Provider: Home Office ?  ?I discussed the limitations, risks, security and privacy concerns of performing an evaluation and management service by telephone and the availability of in person appointments. I also discussed with the patient that there may be a patient responsible charge related to this service. The patient expressed understanding and agreed to proceed. ? ? ?History of Present Illness: ?Patient is evaluated by phone session.  She is doing better and taking her medication.  She moved back with her mother and her roommate had dropped the charges.  However she is planning to move out in June to live with her friend as a roommate.  She is excited about it.  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  Overall she feels things are going well.  She denies any anger, mood swing, agitation.  She admitted not taking naltrexone because she is not drinking and she has no urge.  She denies any mania or psychosis.  Her appetite is okay.  Her daughter is living with father and she does see the daughter on the weekends.  She has no tremors or shakes.  She denies any highs and lows.  She had blood work earlier in January and her labs are normal. ?    ?Past Psychiatric History: Reviewed. ?H/O cutting, mood swing, mania, anger, promiscuity, abusing pain medication, cocaine and ETOH. Diagnosed ADHD, bipolar, BPD, PTSD and anxiety. Saw Dr Lovena Le at [facility].  Took Effexor, BuSpar, gabapentin, Vistaril, Prozac, Cymbalta, Seroquel, trazodone, Zoloft, Lexapro, Klonopin, Abilify, Valium, Adderall, Xanax and Klonopin.  No h/o inpatient or suicidal attempt.  Did CDIOP in Aug 2020. Given baclofen and clonidine.  ? ?Recent Results (from  the past 2160 hour(s))  ?CBC With Differential     Status: Abnormal  ? Collection Time: 07/30/21  4:43 PM  ?Result Value Ref Range  ? WBC 9.1 3.4 - 10.8 x10E3/uL  ? RBC 4.65 3.77 - 5.28 x10E6/uL  ? Hemoglobin 15.4 11.1 - 15.9 g/dL  ? Hematocrit 44.5 34.0 - 46.6 %  ? MCV 96 79 - 97 fL  ? MCH 33.1 (H) 26.6 - 33.0 pg  ? MCHC 34.6 31.5 - 35.7 g/dL  ? RDW 12.8 11.7 - 15.4 %  ? Neutrophils 32 Not Estab. %  ? Lymphs 56 Not Estab. %  ? Monocytes 8 Not Estab. %  ? Eos 3 Not Estab. %  ? Basos 1 Not Estab. %  ? Neutrophils Absolute 2.9 1.4 - 7.0 x10E3/uL  ? Lymphocytes Absolute 5.2 (H) 0.7 - 3.1 x10E3/uL  ? Monocytes Absolute 0.7 0.1 - 0.9 x10E3/uL  ? EOS (ABSOLUTE) 0.2 0.0 - 0.4 x10E3/uL  ? Basophils Absolute 0.1 0.0 - 0.2 x10E3/uL  ? Immature Granulocytes 0 Not Estab. %  ? Immature Grans (Abs) 0.0 0.0 - 0.1 x10E3/uL  ?TSH     Status: None  ? Collection Time: 07/30/21  4:43 PM  ?Result Value Ref Range  ? TSH 2.940 0.450 - 4.500 uIU/mL  ?Comprehensive Metabolic Panel (CMET)     Status: Abnormal  ? Collection Time: 07/30/21  4:43 PM  ?Result Value Ref Range  ? Glucose 99 70 - 99 mg/dL  ? BUN 4 (L) 6 -  20 mg/dL  ? Creatinine, Ser 0.87 0.57 - 1.00 mg/dL  ? eGFR 90 >59 mL/min/1.73  ? BUN/Creatinine Ratio 5 (L) 9 - 23  ? Sodium 139 134 - 144 mmol/L  ? Potassium 4.3 3.5 - 5.2 mmol/L  ? Chloride 99 96 - 106 mmol/L  ? CO2 22 20 - 29 mmol/L  ? Calcium 10.3 (H) 8.7 - 10.2 mg/dL  ? Total Protein 7.1 6.0 - 8.5 g/dL  ? Albumin 4.7 3.8 - 4.8 g/dL  ? Globulin, Total 2.4 1.5 - 4.5 g/dL  ? Albumin/Globulin Ratio 2.0 1.2 - 2.2  ? Bilirubin Total <0.2 0.0 - 1.2 mg/dL  ? Alkaline Phosphatase 71 44 - 121 IU/L  ? AST 36 0 - 40 IU/L  ? ALT 44 (H) 0 - 32 IU/L  ?HgB A1c     Status: None  ? Collection Time: 07/30/21  4:43 PM  ?Result Value Ref Range  ? Hgb A1c MFr Bld 5.3 4.8 - 5.6 %  ?  Comment:          Prediabetes: 5.7 - 6.4 ?         Diabetes: >6.4 ?         Glycemic control for adults with diabetes: <7.0 ?  ? Est. average glucose Bld gHb Est-mCnc  105 mg/dL  ?  ? ?Psychiatric Specialty Exam: ?Physical Exam  ?Review of Systems  ?Weight 207 lb (93.9 kg).There is no height or weight on file to calculate BMI.  ?General Appearance: NA  ?Eye Contact:  NA  ?Speech:  Normal Rate  ?Volume:  Normal  ?Mood:  Euthymic  ?Affect:  NA  ?Thought Process:  Goal Directed  ?Orientation:  Full (Time, Place, and Person)  ?Thought Content:  WDL  ?Suicidal Thoughts:  No  ?Homicidal Thoughts:  No  ?Memory:  Immediate;   Good ?Recent;   Good ?Remote;   Fair  ?Judgement:  Intact  ?Insight:  Present  ?Psychomotor Activity:  NA  ?Concentration:  Concentration: Good and Attention Span: Fair  ?Recall:  Good  ?Fund of Knowledge:  Good  ?Language:  Good  ?Akathisia:  No  ?Handed:  Right  ?AIMS (if indicated):     ?Assets:  Communication Skills ?Desire for Improvement ?Housing ?Resilience ?Talents/Skills ?Transportation  ?ADL's:  Intact  ?Cognition:  WNL  ?Sleep:   fair  ? ? ? ? ?Assessment and Plan: ?Bipolar disorder type I.  Anxiety.  Alcohol dependency in remission. ? ?I reviewed blood work results.  She is doing better on medication.  She is relieved the charges were dropped but she is still moving out from her mother's house and living with her roommate.  We discussed medication side effects.  She is not taking naltrexone however in the past she had a relapse into drinking.  Patient promised if she had started urges to drink and then she will resume the medication and call us back immediately.  For now we will continue Effexor 150 mg daily, Lyrica 150 mg daily, trazodone 100 mg at bedtime, Abilify 5 mg daily and Lamictal 150 mg twice a day.  She like to have her prescription sent to local pharmacy in [place].  Recommended to call us back if is any question or any concern.  Follow-up in 3 months. ? ?Follow Up Instructions: ? ?  ?I discussed the assessment and treatment plan with the patient. The patient was provided an opportunity to ask questions and all were answered. The patient  agreed with the plan and demonstrated an  understanding of the instructions. ?  ?The patient was advised to call back or seek an in-person evaluation if the symptoms worsen or if the condition fails to improve as anticipated. ? ?Collaboration of Care: Other provider involved in patient's care AEB notes are available in epic to review ? ?Patient/Guardian was advised Release of Information must be obtained prior to any record release in order to collaborate their care with an outside provider. Patient/Guardian was advised if they have not already done so to contact the registration department to sign all necessary forms in order for [place] to release information regarding their care.  ? ?Consent: Patient/Guardian gives verbal consent for treatment and assignment of benefits for services provided during this visit. Patient/Guardian expressed understanding and agreed to proceed.   ? ?I provided 12 minutes of non-face-to-face time during this encounter. ? ? ?Kathlee Nations, MD  ?

## 2021-11-22 ENCOUNTER — Other Ambulatory Visit (HOSPITAL_COMMUNITY)
Admission: RE | Admit: 2021-11-22 | Discharge: 2021-11-22 | Disposition: A | Discharge status: Home / Self Care | Payer: Medicaid Other | Source: Ambulatory Visit | Attending: Nurse Practitioner | Admitting: Nurse Practitioner

## 2021-11-22 ENCOUNTER — Ambulatory Visit (INDEPENDENT_AMBULATORY_CARE_PROVIDER_SITE_OTHER): Payer: 59 | Admitting: Nurse Practitioner

## 2021-11-22 ENCOUNTER — Encounter: Payer: Self-pay | Admitting: Nurse Practitioner

## 2021-11-22 VITALS — BP 122/74 | Ht 64.0 in | Wt 216.0 lb

## 2021-11-22 DIAGNOSIS — Z113 Encounter for screening for infections with a predominantly sexual mode of transmission: Secondary | ICD-10-CM | POA: Diagnosis present

## 2021-11-22 DIAGNOSIS — Z3009 Encounter for other general counseling and advice on contraception: Secondary | ICD-10-CM

## 2021-11-22 DIAGNOSIS — Z01419 Encounter for gynecological examination (general) (routine) without abnormal findings: Secondary | ICD-10-CM

## 2021-11-22 NOTE — Progress Notes (Signed)
? ?  Tammy Wilkinson Dec 07, 1985 [id number] ? ? ?History:  36 y.o. [id number] presents to establish care and discuss contraception. Nexplanon inserted 4 years ago. She does not want another due to weight gain. Smoker. Cryosurgery at age 31, normal paps since.  ADHD, anxiety, depression managed by psychiatry.  ? ?Gynecologic History ?No LMP recorded. Patient has had an implant. ?  ?Contraception/Family planning: Nexplanon ?Sexually active: Yes ? ?Health Maintenance ?Last Pap: 2-3 years ago per patient. Results were: Normal ?Last mammogram: Not indicated ?Last colonoscopy: Not indicated ?Last Dexa: Not indicated  ? ?Past medical history, past surgical history, family history and social history were all reviewed and documented in the EPIC chart. Recently divorced. Works seasonally doing taxes. 2 daughters age 35 and 27.  ? ?ROS:  A ROS was performed and pertinent positives and negatives are included. ? ?Exam: ? ?Vitals:  ? 11/22/21 1115  ?BP: 122/74  ?Weight: 216 lb (98 kg)  ?Height: 5\' 4"  (1.626 m)  ? ?Body mass index is 37.08 kg/m?. ? ?General appearance:  Normal ?Thyroid:  Symmetrical, normal in size, without palpable masses or nodularity. ?Respiratory ? Auscultation:  Clear without wheezing or rhonchi ?Cardiovascular ? Auscultation:  Regular rate, without rubs, murmurs or gallops ? Edema/varicosities:  Not grossly evident ?Abdominal ? Soft,nontender, without masses, guarding or rebound. ? Liver/spleen:  No organomegaly noted ? Hernia:  None appreciated ? Skin ? Inspection:  Grossly normal ?Breasts: Examined lying and sitting.  ? Right: Without masses, retractions, nipple discharge or axillary adenopathy. ? ? Left: Without masses, retractions, nipple discharge or axillary adenopathy. ?Genitourinary  ? Inguinal/mons:  Normal without inguinal adenopathy ? External genitalia:  Normal appearing vulva with no masses, tenderness, or lesions ? BUS/Urethra/Skene's glands:  Normal ? Vagina:  Normal appearing with normal color and  discharge, no lesions ? Cervix:  Normal appearing without discharge or lesions ? Uterus:  Normal in size, shape and contour.  Midline and mobile, nontender ? Adnexa/parametria:   ?  Rt: Normal in size, without masses or tenderness. ?  Lt: Normal in size, without masses or tenderness. ? Anus and perineum: Normal ? Digital rectal exam: Not indicated ? ?Patient informed chaperone available to be present for breast and pelvic exam. Patient has requested no chaperone to be present. Patient has been advised what will be completed during breast and pelvic exam.  ? ?Assessment/Plan:  36 y.o. G2P2002 to establish care and discuss contraception.  ? ?Well female exam with routine gynecological exam - Plan: Cytology - PAP( [facility]). Education provided on SBEs, importance of preventative screenings, current guidelines, high calcium diet, regular exercise, and multivitamin daily.  Labs with PCP.  ? ?General counseling and advice on female contraception - Plan: IUD Insertion, Removal of implanon rod. Nexplanon placed 4 years ago. Does not want replaced due to weight gain. Aware to use backup contraception due to expiration. Will avoid estrogen due to smoking status. She is interested in Mirena IUD.  ? ?Screen for STD (sexually transmitted disease) - Plan: Cytology - PAP( [place]). GC/chlamydia, trich added to pap. Sexually active with ex husband.  ? ?Screening for cervical cancer - Cryosurgery at age 36, normal paps since. Pap with HR HPV today.  ? ?Return for Nexplanon removal and Mirena IUD insertion.  ?Return in 1 year for annual.  ? ?12 DNP, 11:45 AM 11/22/2021 ? ?

## 2021-11-23 LAB — CYTOLOGY - PAP
Adequacy: ABSENT
Chlamydia: NEGATIVE
Comment: NEGATIVE
Comment: NEGATIVE
Comment: NEGATIVE
Comment: NORMAL
Diagnosis: NEGATIVE
High risk HPV: NEGATIVE
Neisseria Gonorrhea: NEGATIVE
Trichomonas: NEGATIVE

## 2021-11-26 ENCOUNTER — Other Ambulatory Visit (HOSPITAL_COMMUNITY): Payer: Self-pay | Admitting: Psychiatry

## 2021-11-26 DIAGNOSIS — F3131 Bipolar disorder, current episode depressed, mild: Secondary | ICD-10-CM

## 2021-11-30 ENCOUNTER — Encounter: Payer: Self-pay | Admitting: Nurse Practitioner

## 2021-11-30 ENCOUNTER — Ambulatory Visit (INDEPENDENT_AMBULATORY_CARE_PROVIDER_SITE_OTHER): Payer: 59 | Admitting: Nurse Practitioner

## 2021-11-30 VITALS — BP 124/76

## 2021-11-30 DIAGNOSIS — Z3009 Encounter for other general counseling and advice on contraception: Secondary | ICD-10-CM

## 2021-11-30 DIAGNOSIS — Z30430 Encounter for insertion of intrauterine contraceptive device: Secondary | ICD-10-CM

## 2021-11-30 DIAGNOSIS — Z3046 Encounter for surveillance of implantable subdermal contraceptive: Secondary | ICD-10-CM

## 2021-11-30 DIAGNOSIS — Z01818 Encounter for other preprocedural examination: Secondary | ICD-10-CM

## 2021-11-30 NOTE — Progress Notes (Signed)
? ? ? ?  Tammy Wilkinson [birth year] [id number] ? ? ?History:  36 y.o. [id number] presents for Nexplanon removal and Mirena IUD insertion.  Pt has been counseled about risks and benefits as well as complications.  Consent is obtained today. ? ?No LMP recorded. Patient has had an implant. ?STD testing: 11/22/2021, negative ? ?Past medical history, past surgical history, family history and social history were all reviewed and documented in the EPIC chart. ? ?ROS:  A ROS was performed and pertinent positives and negatives are included. ? ?Exam: ?Vitals:  ? 11/30/21 1419  ?BP: 124/76  ? ?There is no height or weight on file to calculate BMI. ? ?Pelvic exam: ?Vulva:  normal female genitalia ?Vagina:  normal vagina, no discharge, exudate, lesion, or erythema ?Cervix:  Non-tender, Negative CMT, no lesions or redness. ?Uterus:  normal shape, position and consistency  ?  ?Procedure:  Speculum inserted.   ?Cervix visualized and cleansed with Betadine x 3.  Tenaculum placed on anterior cervix. Then uterus sounded to 8 cm. IUD inserted easily. Strings trimmed to 3 cm. Minimal bleeding noted.  Pt tolerated the procedure well. ? ?Procedure: Patient placed supine on exam table with her left arm flexed at the elbow. The prior insertion site was located and the Nexplanon rod was palpated.  Area cleansed with Betadine x 3 and draped in normal sterile fashion.  Insertion site and surrounding tissue anesthetized with 1% Lidocaine without epinephrine, 2cc total used.  Small incision made with #11 blade.  Nexplanon removed without difficulty.  Steri-strips were applied and pressure dressing placed over the site.  Entire procedure performed with sterile technique.  Pt tolerated procedure well. ? ?Chaperone present: Kennon Portela, CMA ? ?Assessment: Nexplanon removal/ Mirena IUD insertion ? ?Plan:  Post procedure instructions reviewed with pt.  Questions answered.  Pt knows to call with any concerns or questions.  ?  ?Return for recheck 4-6  weeks ?Pt aware to call for any concerns ?Pt aware removal due no later than 11/30/2029, IUD card given to pt. ? ?Wyline Beady, DNP 11/30/2021 3:13pm ?

## 2021-12-01 ENCOUNTER — Other Ambulatory Visit (HOSPITAL_COMMUNITY): Payer: Self-pay | Admitting: *Deleted

## 2021-12-01 DIAGNOSIS — F3131 Bipolar disorder, current episode depressed, mild: Secondary | ICD-10-CM

## 2021-12-01 MED ORDER — ARIPIPRAZOLE 5 MG PO TABS: 5.0000 mg | ORAL_TABLET | Freq: Every day | ORAL | 0 refills | Status: DC

## 2021-12-01 MED ORDER — VENLAFAXINE HCL ER 150 MG PO CP24: ORAL_CAPSULE | ORAL | 0 refills | Status: DC

## 2021-12-02 ENCOUNTER — Encounter: Payer: Self-pay | Admitting: Gynecology

## 2021-12-02 ENCOUNTER — Encounter (HOSPITAL_BASED_OUTPATIENT_CLINIC_OR_DEPARTMENT_OTHER): Payer: Self-pay | Admitting: *Deleted

## 2021-12-02 ENCOUNTER — Other Ambulatory Visit: Payer: Self-pay

## 2021-12-02 ENCOUNTER — Inpatient Hospital Stay (HOSPITAL_BASED_OUTPATIENT_CLINIC_OR_DEPARTMENT_OTHER)
Admission: EM | Admit: 2021-12-02 | Discharge: 2021-12-10 | DRG: 872 | Disposition: A | Discharge status: Home / Self Care | Payer: 59 | Attending: Family Medicine | Admitting: Family Medicine

## 2021-12-02 ENCOUNTER — Encounter: Payer: Self-pay | Admitting: Nurse Practitioner

## 2021-12-02 ENCOUNTER — Emergency Department (HOSPITAL_BASED_OUTPATIENT_CLINIC_OR_DEPARTMENT_OTHER): Payer: 59

## 2021-12-02 ENCOUNTER — Emergency Department (HOSPITAL_BASED_OUTPATIENT_CLINIC_OR_DEPARTMENT_OTHER): Payer: 59 | Admitting: Radiology

## 2021-12-02 DIAGNOSIS — F17210 Nicotine dependence, cigarettes, uncomplicated: Secondary | ICD-10-CM | POA: Diagnosis present

## 2021-12-02 DIAGNOSIS — Z885 Allergy status to narcotic agent status: Secondary | ICD-10-CM

## 2021-12-02 DIAGNOSIS — F319 Bipolar disorder, unspecified: Secondary | ICD-10-CM | POA: Diagnosis present

## 2021-12-02 DIAGNOSIS — E876 Hypokalemia: Secondary | ICD-10-CM | POA: Diagnosis not present

## 2021-12-02 DIAGNOSIS — L03114 Cellulitis of left upper limb: Secondary | ICD-10-CM | POA: Diagnosis present

## 2021-12-02 DIAGNOSIS — A419 Sepsis, unspecified organism: Secondary | ICD-10-CM | POA: Diagnosis not present

## 2021-12-02 DIAGNOSIS — Z91040 Latex allergy status: Secondary | ICD-10-CM

## 2021-12-02 DIAGNOSIS — Z20822 Contact with and (suspected) exposure to covid-19: Secondary | ICD-10-CM | POA: Diagnosis present

## 2021-12-02 DIAGNOSIS — Z880 Allergy status to penicillin: Secondary | ICD-10-CM

## 2021-12-02 DIAGNOSIS — Z79899 Other long term (current) drug therapy: Secondary | ICD-10-CM

## 2021-12-02 DIAGNOSIS — J020 Streptococcal pharyngitis: Secondary | ICD-10-CM | POA: Diagnosis present

## 2021-12-02 DIAGNOSIS — R6520 Severe sepsis without septic shock: Secondary | ICD-10-CM | POA: Diagnosis present

## 2021-12-02 DIAGNOSIS — L0100 Impetigo, unspecified: Secondary | ICD-10-CM | POA: Diagnosis present

## 2021-12-02 DIAGNOSIS — F909 Attention-deficit hyperactivity disorder, unspecified type: Secondary | ICD-10-CM | POA: Diagnosis present

## 2021-12-02 DIAGNOSIS — Z6839 Body mass index (BMI) 39.0-39.9, adult: Secondary | ICD-10-CM

## 2021-12-02 DIAGNOSIS — L0390 Cellulitis, unspecified: Secondary | ICD-10-CM | POA: Diagnosis present

## 2021-12-02 DIAGNOSIS — Z818 Family history of other mental and behavioral disorders: Secondary | ICD-10-CM

## 2021-12-02 LAB — CBC WITH DIFFERENTIAL/PLATELET
Abs Immature Granulocytes: 0.09 10*3/uL — ABNORMAL HIGH (ref 0.00–0.07)
Basophils Absolute: 0.2 10*3/uL — ABNORMAL HIGH (ref 0.0–0.1)
Basophils Relative: 1 %
Eosinophils Absolute: 0.3 10*3/uL (ref 0.0–0.5)
Eosinophils Relative: 2 %
HCT: 39.4 % (ref 36.0–46.0)
Hemoglobin: 13.5 g/dL (ref 12.0–15.0)
Immature Granulocytes: 1 %
Lymphocytes Relative: 6 %
Lymphs Abs: 1.1 10*3/uL (ref 0.7–4.0)
MCH: 32.5 pg (ref 26.0–34.0)
MCHC: 34.3 g/dL (ref 30.0–36.0)
MCV: 94.7 fL (ref 80.0–100.0)
Monocytes Absolute: 0.2 10*3/uL (ref 0.1–1.0)
Monocytes Relative: 1 %
Neutro Abs: 15.7 10*3/uL — ABNORMAL HIGH (ref 1.7–7.7)
Neutrophils Relative %: 89 %
Platelets: 255 10*3/uL (ref 150–400)
RBC: 4.16 MIL/uL (ref 3.87–5.11)
RDW: 13.2 % (ref 11.5–15.5)
WBC: 17.5 10*3/uL — ABNORMAL HIGH (ref 4.0–10.5)
nRBC: 0 % (ref 0.0–0.2)

## 2021-12-02 LAB — URINALYSIS, ROUTINE W REFLEX MICROSCOPIC
Bilirubin Urine: NEGATIVE
Glucose, UA: NEGATIVE mg/dL
Hgb urine dipstick: NEGATIVE
Ketones, ur: NEGATIVE mg/dL
Leukocytes,Ua: NEGATIVE
Nitrite: NEGATIVE
Specific Gravity, Urine: 1.028 (ref 1.005–1.030)
pH: 6 (ref 5.0–8.0)

## 2021-12-02 LAB — LACTIC ACID, PLASMA
Lactic Acid, Venous: 2.6 mmol/L (ref 0.5–1.9)
Lactic Acid, Venous: 0.9 mmol/L (ref 0.5–1.9)

## 2021-12-02 LAB — COMPREHENSIVE METABOLIC PANEL
ALT: 14 U/L (ref 0–44)
AST: 15 U/L (ref 15–41)
Albumin: 4 g/dL (ref 3.5–5.0)
Alkaline Phosphatase: 58 U/L (ref 38–126)
Anion gap: 14 (ref 5–15)
BUN: 6 mg/dL (ref 6–20)
CO2: 25 mmol/L (ref 22–32)
Calcium: 9.2 mg/dL (ref 8.9–10.3)
Chloride: 97 mmol/L — ABNORMAL LOW (ref 98–111)
Creatinine, Ser: 0.81 mg/dL (ref 0.44–1.00)
GFR, Estimated: >60 mL/min (ref >60)
Glucose, Bld: 160 mg/dL — ABNORMAL HIGH (ref 70–99)
Potassium: 3.1 mmol/L — ABNORMAL LOW (ref 3.5–5.1)
Sodium: 136 mmol/L (ref 135–145)
Total Bilirubin: 0.8 mg/dL (ref 0.3–1.2)
Total Protein: 7.1 g/dL (ref 6.5–8.1)

## 2021-12-02 LAB — RESP PANEL BY RT-PCR (FLU A&B, COVID) ARPGX2
Influenza A by PCR: NEGATIVE
Influenza B by PCR: NEGATIVE
SARS Coronavirus 2 by RT PCR: NEGATIVE

## 2021-12-02 LAB — PREGNANCY, URINE: Preg Test, Ur: NEGATIVE

## 2021-12-02 LAB — GROUP A STREP BY PCR: Group A Strep by PCR: DETECTED — AB

## 2021-12-02 LAB — PROTIME-INR
INR: 1 (ref 0.8–1.2)
Prothrombin Time: 13.4 seconds (ref 11.4–15.2)

## 2021-12-02 MED ORDER — NICOTINE 21 MG/24HR TD PT24
21.0000 mg | MEDICATED_PATCH | Freq: Every day | TRANSDERMAL | Status: DC
  Administered 2021-12-02 – 2021-12-10 (×9): 21 mg via TRANSDERMAL
  Filled 2021-12-02 (×9): qty 1

## 2021-12-02 MED ORDER — SODIUM CHLORIDE 0.9 % IV BOLUS
1000.0000 mL | Freq: Once | INTRAVENOUS | Status: AC
  Administered 2021-12-02: 1000 mL via INTRAVENOUS

## 2021-12-02 MED ORDER — OXYCODONE-ACETAMINOPHEN 5-325 MG PO TABS
2.0000 | ORAL_TABLET | Freq: Once | ORAL | Status: AC
  Administered 2021-12-02: 2 via ORAL
  Filled 2021-12-02: qty 2

## 2021-12-02 MED ORDER — LACTATED RINGERS IV SOLN: INTRAVENOUS | Status: DC

## 2021-12-02 MED ORDER — VANCOMYCIN HCL IN DEXTROSE 1-5 GM/200ML-% IV SOLN
1000.0000 mg | Freq: Once | INTRAVENOUS | Status: AC
  Administered 2021-12-02: 1000 mg via INTRAVENOUS
  Filled 2021-12-02: qty 200

## 2021-12-02 MED ORDER — FENTANYL CITRATE PF 50 MCG/ML IJ SOSY
50.0000 ug | PREFILLED_SYRINGE | Freq: Once | INTRAMUSCULAR | Status: AC
  Administered 2021-12-02: 50 ug via INTRAVENOUS
  Filled 2021-12-02: qty 1

## 2021-12-02 MED ORDER — SODIUM CHLORIDE 0.9 % IV SOLN
2.0000 g | Freq: Once | INTRAVENOUS | Status: AC
  Administered 2021-12-02: 2 g via INTRAVENOUS
  Filled 2021-12-02: qty 20

## 2021-12-02 NOTE — Telephone Encounter (Signed)
She needs to be seen, either here or at Urgent Care.

## 2021-12-02 NOTE — Sepsis Progress Note (Signed)
Monitoring for the code sepsis protocol. °

## 2021-12-02 NOTE — Telephone Encounter (Signed)
I spoke with Dr. Seymour Bars who is on call and spoke on the phone with patient. Dr. Mackey Birchwood recommended to Urgent Care this evening and visit with Dr. Mackey Birchwood tomorrow to follow up.  Patient agreed.  Appt scheduled for 3:30pm tomorrow with Dr. Mackey Birchwood and patient to Urgent Care.

## 2021-12-02 NOTE — ED Notes (Signed)
Date and time results received: 12/02/21 1953 (use smartphrase ".now" to insert current time)  Test: lactic acid Critical Value: 2.6  Name of Provider Notified: Dr Tamera Punt  Orders Received? Or Actions Taken?:  n/a

## 2021-12-02 NOTE — ED Triage Notes (Signed)
Pt is here for cellulitis in left upper arm.  Pt has a nexplanon removed 2 days ago and has been having increasing pain and swelling and redness since yesterday.  Arm is significantly swollen and there is some drainage. Pt denies any fever or chills.

## 2021-12-02 NOTE — ED Provider Notes (Signed)
[facility]   CSN: [id number] Arrival date & time: 12/02/21  1837     History  Chief Complaint  Patient presents with   Cellulitis    Tammy Wilkinson is a 36 y.o. female.  Patient is a 36 year old female who presents with a possible infection of her left upper arm.  She said she had a Nexplanon removed there 2 days ago.  Since that time she has had increasing redness and pain with swelling of the arm.  She denies any fevers.  She has had a little bit of clear drainage from the wound.  She also has had a sore throat.  She has had some ongoing URI symptoms but says that is been an ongoing thing related to the pollen.      Home Medications Prior to Admission medications   Medication Sig Start Date End Date Taking? Authorizing Provider  albuterol (VENTOLIN HFA) 108 (90 Base) MCG/ACT inhaler Inhale into the lungs. 12/02/18   [provider]  amphetamine-dextroamphetamine (ADDERALL) 10 MG tablet Take 10 mg by mouth 2 (two) times daily. 11/12/21   [provider]  ARIPiprazole (ABILIFY) 5 MG tablet Take 1 tablet (5 mg total) by mouth daily. 12/01/21   Arfeen, Phillips GroutSyed T, MD  etonogestrel (NEXPLANON) 68 MG IMPL implant Inject into the skin.    [provider]  ibuprofen (ADVIL) 800 MG tablet  05/14/19   [provider]  lamoTRIgine (LAMICTAL) 150 MG tablet Take 1 tablet (150 mg total) by mouth 2 (two) times daily. 10/26/21 01/24/22  Arfeen, Phillips GroutSyed T, MD  levonorgestrel (MIRENA) 20 MCG/DAY IUD 1 each by Intrauterine route once. 11/30/21   [provider]  meloxicam (MOBIC) 7.5 MG tablet Take 1 tablet (7.5 mg total) by mouth daily. Can increase to twice daily if necessary Patient not taking: Reported on 11/22/2021 08/27/21   Rhys MartiniGraham, Laura E, PA-C  naltrexone (DEPADE) 50 MG tablet Take 1 tablet (50 mg total) by mouth daily. Patient not taking: Reported on 10/26/2021 07/27/21   Arfeen, Phillips GroutSyed T, MD  omeprazole (PRILOSEC)  10 MG capsule Take by mouth.    [provider]  pregabalin (LYRICA) 150 MG capsule Take 1 capsule (150 mg total) by mouth daily. 10/26/21   Arfeen, Phillips GroutSyed T, MD  SUMAtriptan Willette Brace(IMITREX) 20 MG/ACT nasal spray  12/27/19   [provider]  traZODone (DESYREL) 100 MG tablet TAKE 1 TABLET(100 MG) BY MOUTH AT BEDTIME 10/26/21   Arfeen, Phillips GroutSyed T, MD  venlafaxine XR (EFFEXOR-XR) 150 MG 24 hr capsule TAKE 1 CAPSULE(150 MG) BY MOUTH DAILY WITH BREAKFAST 12/01/21   Arfeen, Phillips GroutSyed T, MD      Allergies    Latex, Codeine, Amoxil [amoxicillin], and Penicillins    Review of Systems   Review of Systems  Constitutional:  Negative for chills, diaphoresis, fatigue and fever.  HENT:  Positive for rhinorrhea and sore throat. Negative for congestion and sneezing.   Eyes: Negative.   Respiratory:  Negative for cough, chest tightness and shortness of breath.   Cardiovascular:  Negative for chest pain and leg swelling.  Gastrointestinal:  Negative for abdominal pain, blood in stool, diarrhea, nausea and vomiting.  Genitourinary:  Negative for difficulty urinating, flank pain, frequency and hematuria.  Musculoskeletal:  Negative for arthralgias and back pain.  Skin:  Positive for rash and wound.  Neurological:  Negative for dizziness, speech difficulty, weakness, numbness and headaches.   Physical Exam Updated Vital Signs BP 107/64 (BP Location: Right Arm)   Pulse Marland Kitchen(!)  112   Temp 98.5 F (36.9 C)   Resp 16   Wt 97 kg   SpO2 98%   BMI 36.71 kg/m  Physical Exam Constitutional:      Appearance: She is well-developed.  HENT:     Head: Normocephalic and atraumatic.     Mouth/Throat:     Comments: Erythema to the posterior pharynx bilaterally, no exudates, uvula is midline, no trismus, no peritonsillar fullness Eyes:     Pupils: Pupils are equal, round, and reactive to light.  Cardiovascular:     Rate and Rhythm: Normal rate and regular rhythm.     Heart sounds: Normal heart sounds.  Pulmonary:      Effort: Pulmonary effort is normal. No respiratory distress.     Breath sounds: Normal breath sounds. No wheezing or rales.  Chest:     Chest wall: No tenderness.  Abdominal:     General: Bowel sounds are normal.     Palpations: Abdomen is soft.     Tenderness: There is no abdominal tenderness. There is no guarding or rebound.  Musculoskeletal:        General: Normal range of motion.     Cervical back: Normal range of motion and neck supple.     Comments: Large area of redness and swelling to her left upper arm.  There is a wound where the Nexplanon was removed.  There are some serous drainage from the wound.  There are some induration to the arm but no fluctuance.  Lymphadenopathy:     Cervical: No cervical adenopathy.  Skin:    General: Skin is warm and dry.     Findings: No rash.  Neurological:     Mental Status: She is alert and oriented to person, place, and time.     ED Results / Procedures / Treatments   Labs (all labs ordered are listed, but only abnormal results are displayed) Labs Reviewed  GROUP A STREP BY PCR - Abnormal; Notable for the following components:      Result Value   Group A Strep by PCR DETECTED (*)    All other components within normal limits  COMPREHENSIVE METABOLIC PANEL - Abnormal; Notable for the following components:   Potassium 3.1 (*)    Chloride 97 (*)    Glucose, Bld 160 (*)    All other components within normal limits  LACTIC ACID, PLASMA - Abnormal; Notable for the following components:   Lactic Acid, Venous 2.6 (*)    All other components within normal limits  CBC WITH DIFFERENTIAL/PLATELET - Abnormal; Notable for the following components:   WBC 17.5 (*)    Neutro Abs 15.7 (*)    Basophils Absolute 0.2 (*)    Abs Immature Granulocytes 0.09 (*)    All other components within normal limits  RESP PANEL BY RT-PCR (FLU A&B, COVID) ARPGX2  CULTURE, BLOOD (ROUTINE X 2)  CULTURE, BLOOD (ROUTINE X 2)  LACTIC ACID, PLASMA  PROTIME-INR   URINALYSIS, ROUTINE W REFLEX MICROSCOPIC  PREGNANCY, URINE    EKG None  Radiology DG Chest 2 View  Result Date: 12/02/2021 CLINICAL DATA:  Suspected sepsis. EXAM: CHEST - 2 VIEW COMPARISON:  Chest x-ray 03/29/2013 FINDINGS: The heart size and mediastinal contours are within normal limits. Both lungs are clear. The visualized skeletal structures are unremarkable. IMPRESSION: No active cardiopulmonary disease. Electronically Signed   By: Darliss Cheney M.D.   On: 12/02/2021 19:31   [place] LT UPPER EXTREM LTD SOFT TISSUE NON VASCULAR  Result  Date: 12/02/2021 CLINICAL DATA:  Left upper arm swelling/redness x2 days, status post Nexplanon removal EXAM: ULTRASOUND LEFT UPPER EXTREMITY LIMITED TECHNIQUE: Ultrasound examination of the upper extremity soft tissues was performed in the area of clinical concern. COMPARISON:  None Available. FINDINGS: Soft tissue ultrasound was performed in the area of clinical concern (left upper extremity). Subcutaneous edema is present. No drainable fluid collection/abscess. IMPRESSION: Subcutaneous edema in the area of clinical concern, compatible with the clinical history of cellulitis. No drainable fluid collection/abscess. Electronically Signed   By: Charline Bills M.D.   On: 12/02/2021 21:01    Procedures Procedures    Medications Ordered in ED Medications  lactated ringers infusion ( Intravenous New Bag/Given 12/02/21 2110)  nicotine (NICODERM CQ - dosed in mg/24 hours) patch 21 mg (21 mg Transdermal Patch Applied 12/02/21 2206)  sodium chloride 0.9 % bolus 1,000 mL ( Intravenous Stopped 12/02/21 2044)  oxyCODONE-acetaminophen (PERCOCET/ROXICET) 5-325 MG per tablet 2 tablet (2 tablets Oral Given 12/02/21 1947)  vancomycin (VANCOCIN) IVPB 1000 mg/200 mL premix (1,000 mg Intravenous New Bag/Given 12/02/21 2038)  cefTRIAXone (ROCEPHIN) 2 g in sodium chloride 0.9 % 100 mL IVPB (0 g Intravenous Stopped 12/02/21 2109)    ED Course/ Medical Decision Making/ A&P                            Medical Decision Making Amount and/or Complexity of Data Reviewed Labs: ordered. Radiology: ordered.  Risk OTC drugs. Prescription drug management. Decision regarding hospitalization.   Patient is a 36 year old female who presents with cellulitis of the left arm.  She does not have a fever but she is tachycardic.  Her white count is elevated at 17,000.  Her lactate is elevated at 2.6.  She meets criteria for sepsis.  She was given IV fluids and started on vancomycin and Rocephin.  Ultrasound was performed which shows no evidence of abscess.  Chest x-ray was done which was interpreted by me.  There is no evidence of pneumonia.  She does have a sore throat.  Strep test was done which is positive for strep throat.  I spoke with Dr. Margo Aye who will admit the patient for further treatment.  Her heart rate is improving after fluids in the ED.  CRITICAL CARE Performed by: Rolan Bucco Total critical care time: 50 minutes Critical care time was exclusive of separately billable procedures and treating other patients. Critical care was necessary to treat or prevent imminent or life-threatening deterioration. Critical care was time spent personally by me on the following activities: development of treatment plan with patient and/or surrogate as well as nursing, discussions with consultants, evaluation of patient's response to treatment, examination of patient, obtaining history from patient or surrogate, ordering and performing treatments and interventions, ordering and review of laboratory studies, ordering and review of radiographic studies, pulse oximetry and re-evaluation of patient's condition.   Final Clinical Impression(s) / ED Diagnoses Final diagnoses:  Cellulitis of left upper extremity  Sepsis, due to unspecified organism, unspecified whether acute organ dysfunction present (HCC)  Streptococcal sore throat    Rx / DC Orders ED Discharge Orders     None          Rolan Bucco, MD 12/02/21 2210

## 2021-12-03 ENCOUNTER — Ambulatory Visit: Payer: 59 | Admitting: Obstetrics & Gynecology

## 2021-12-03 ENCOUNTER — Encounter (HOSPITAL_COMMUNITY): Payer: Self-pay | Admitting: Internal Medicine

## 2021-12-03 DIAGNOSIS — L03114 Cellulitis of left upper limb: Secondary | ICD-10-CM | POA: Diagnosis present

## 2021-12-03 DIAGNOSIS — Z885 Allergy status to narcotic agent status: Secondary | ICD-10-CM | POA: Diagnosis not present

## 2021-12-03 DIAGNOSIS — F319 Bipolar disorder, unspecified: Secondary | ICD-10-CM | POA: Diagnosis present

## 2021-12-03 DIAGNOSIS — E876 Hypokalemia: Secondary | ICD-10-CM | POA: Diagnosis present

## 2021-12-03 DIAGNOSIS — A419 Sepsis, unspecified organism: Secondary | ICD-10-CM | POA: Diagnosis present

## 2021-12-03 DIAGNOSIS — Z6839 Body mass index (BMI) 39.0-39.9, adult: Secondary | ICD-10-CM | POA: Diagnosis not present

## 2021-12-03 DIAGNOSIS — Z79899 Other long term (current) drug therapy: Secondary | ICD-10-CM | POA: Diagnosis not present

## 2021-12-03 DIAGNOSIS — Z91040 Latex allergy status: Secondary | ICD-10-CM | POA: Diagnosis not present

## 2021-12-03 DIAGNOSIS — F17210 Nicotine dependence, cigarettes, uncomplicated: Secondary | ICD-10-CM | POA: Diagnosis present

## 2021-12-03 DIAGNOSIS — J020 Streptococcal pharyngitis: Secondary | ICD-10-CM | POA: Diagnosis present

## 2021-12-03 DIAGNOSIS — Z880 Allergy status to penicillin: Secondary | ICD-10-CM | POA: Diagnosis not present

## 2021-12-03 DIAGNOSIS — R6520 Severe sepsis without septic shock: Secondary | ICD-10-CM | POA: Diagnosis present

## 2021-12-03 DIAGNOSIS — Z818 Family history of other mental and behavioral disorders: Secondary | ICD-10-CM | POA: Diagnosis not present

## 2021-12-03 DIAGNOSIS — F909 Attention-deficit hyperactivity disorder, unspecified type: Secondary | ICD-10-CM | POA: Diagnosis present

## 2021-12-03 DIAGNOSIS — L0100 Impetigo, unspecified: Secondary | ICD-10-CM | POA: Diagnosis present

## 2021-12-03 DIAGNOSIS — Z20822 Contact with and (suspected) exposure to covid-19: Secondary | ICD-10-CM | POA: Diagnosis present

## 2021-12-03 LAB — BASIC METABOLIC PANEL
Anion gap: 8 (ref 5–15)
BUN: 8 mg/dL (ref 6–20)
CO2: 22 mmol/L (ref 22–32)
Calcium: 8.1 mg/dL — ABNORMAL LOW (ref 8.9–10.3)
Chloride: 105 mmol/L (ref 98–111)
Creatinine, Ser: 0.97 mg/dL (ref 0.44–1.00)
GFR, Estimated: >60 mL/min (ref >60)
Glucose, Bld: 151 mg/dL — ABNORMAL HIGH (ref 70–99)
Potassium: 4.1 mmol/L (ref 3.5–5.1)
Sodium: 135 mmol/L (ref 135–145)

## 2021-12-03 MED ORDER — SENNOSIDES-DOCUSATE SODIUM 8.6-50 MG PO TABS: 1.0000 | ORAL_TABLET | Freq: Every evening | ORAL | Status: DC | PRN

## 2021-12-03 MED ORDER — NALTREXONE HCL 50 MG PO TABS: 50.0000 mg | ORAL_TABLET | Freq: Every day | ORAL | Status: DC

## 2021-12-03 MED ORDER — HYDROMORPHONE HCL 1 MG/ML IJ SOLN
0.5000 mg | INTRAMUSCULAR | Status: DC | PRN
  Administered 2021-12-03: 0.5 mg via INTRAVENOUS
  Filled 2021-12-03: qty 0.5

## 2021-12-03 MED ORDER — IBUPROFEN 200 MG PO TABS
600.0000 mg | ORAL_TABLET | Freq: Four times a day (QID) | ORAL | Status: DC | PRN
  Administered 2021-12-03 – 2021-12-09 (×9): 600 mg via ORAL
  Filled 2021-12-03 (×10): qty 3

## 2021-12-03 MED ORDER — ENOXAPARIN SODIUM 60 MG/0.6ML IJ SOSY
50.0000 mg | PREFILLED_SYRINGE | INTRAMUSCULAR | Status: DC
  Administered 2021-12-03 – 2021-12-10 (×8): 50 mg via SUBCUTANEOUS
  Filled 2021-12-03 (×8): qty 0.6

## 2021-12-03 MED ORDER — PANTOPRAZOLE SODIUM 40 MG PO TBEC
40.0000 mg | DELAYED_RELEASE_TABLET | Freq: Every day | ORAL | Status: AC
  Administered 2021-12-03 – 2021-12-07 (×5): 40 mg via ORAL
  Filled 2021-12-03 (×5): qty 1

## 2021-12-03 MED ORDER — SODIUM CHLORIDE 0.9 % IV SOLN
100.0000 mg | Freq: Two times a day (BID) | INTRAVENOUS | Status: DC
  Administered 2021-12-03 – 2021-12-04 (×2): 100 mg via INTRAVENOUS
  Filled 2021-12-03 (×3): qty 100

## 2021-12-03 MED ORDER — POTASSIUM CHLORIDE IN NACL 20-0.9 MEQ/L-% IV SOLN
INTRAVENOUS | Status: AC
  Filled 2021-12-03: qty 1000

## 2021-12-03 MED ORDER — SODIUM CHLORIDE 0.9 % IV SOLN
2.0000 g | INTRAVENOUS | Status: DC
  Administered 2021-12-03 – 2021-12-07 (×5): 2 g via INTRAVENOUS
  Filled 2021-12-03 (×5): qty 20

## 2021-12-03 MED ORDER — AMPHETAMINE-DEXTROAMPHETAMINE 10 MG PO TABS: 10.0000 mg | ORAL_TABLET | Freq: Two times a day (BID) | ORAL | Status: DC

## 2021-12-03 MED ORDER — PREGABALIN 75 MG PO CAPS
150.0000 mg | ORAL_CAPSULE | Freq: Every day | ORAL | Status: DC
  Administered 2021-12-03 – 2021-12-10 (×8): 150 mg via ORAL
  Filled 2021-12-03 (×8): qty 2

## 2021-12-03 MED ORDER — OXYCODONE HCL 5 MG PO TABS
5.0000 mg | ORAL_TABLET | ORAL | Status: DC | PRN
  Administered 2021-12-03 – 2021-12-04 (×6): 5 mg via ORAL
  Filled 2021-12-03 (×7): qty 1

## 2021-12-03 MED ORDER — ARIPIPRAZOLE 5 MG PO TABS
5.0000 mg | ORAL_TABLET | Freq: Every day | ORAL | Status: DC
  Administered 2021-12-03 – 2021-12-10 (×8): 5 mg via ORAL
  Filled 2021-12-03 (×8): qty 1

## 2021-12-03 MED ORDER — FLUTICASONE PROPIONATE 50 MCG/ACT NA SUSP
1.0000 | Freq: Every day | NASAL | Status: DC
  Administered 2021-12-03 – 2021-12-10 (×8): 1 via NASAL
  Filled 2021-12-03: qty 16

## 2021-12-03 MED ORDER — SUMATRIPTAN 20 MG/ACT NA SOLN: 20.0000 mg | NASAL | Status: DC | PRN

## 2021-12-03 MED ORDER — AMPHETAMINE-DEXTROAMPHETAMINE 20 MG PO TABS: 20.0000 mg | ORAL_TABLET | Freq: Every day | ORAL | Status: DC

## 2021-12-03 MED ORDER — VENLAFAXINE HCL ER 150 MG PO CP24
150.0000 mg | ORAL_CAPSULE | Freq: Every day | ORAL | Status: DC
  Administered 2021-12-03 – 2021-12-10 (×8): 150 mg via ORAL
  Filled 2021-12-03 (×8): qty 1

## 2021-12-03 MED ORDER — POTASSIUM CHLORIDE CRYS ER 20 MEQ PO TBCR
20.0000 meq | EXTENDED_RELEASE_TABLET | Freq: Once | ORAL | Status: AC
  Administered 2021-12-03: 20 meq via ORAL
  Filled 2021-12-03: qty 1

## 2021-12-03 MED ORDER — AMPHETAMINE-DEXTROAMPHETAMINE 10 MG PO TABS: 20.0000 mg | ORAL_TABLET | Freq: Every morning | ORAL | Status: DC

## 2021-12-03 MED ORDER — ACETAMINOPHEN 325 MG PO TABS
650.0000 mg | ORAL_TABLET | Freq: Four times a day (QID) | ORAL | Status: DC | PRN
  Administered 2021-12-07 – 2021-12-09 (×7): 650 mg via ORAL
  Filled 2021-12-03 (×9): qty 2

## 2021-12-03 MED ORDER — IBUPROFEN 800 MG PO TABS
800.0000 mg | ORAL_TABLET | Freq: Once | ORAL | Status: AC
  Administered 2021-12-03: 800 mg via ORAL
  Filled 2021-12-03: qty 1

## 2021-12-03 MED ORDER — ACETAMINOPHEN 650 MG RE SUPP: 650.0000 mg | Freq: Four times a day (QID) | RECTAL | Status: DC | PRN

## 2021-12-03 MED ORDER — ALBUTEROL SULFATE (2.5 MG/3ML) 0.083% IN NEBU: 3.0000 mL | INHALATION_SOLUTION | RESPIRATORY_TRACT | Status: DC | PRN

## 2021-12-03 MED ORDER — SALINE SPRAY 0.65 % NA SOLN
1.0000 | NASAL | Status: DC | PRN
  Administered 2021-12-04: 1 via NASAL
  Filled 2021-12-03: qty 44

## 2021-12-03 MED ORDER — TRAZODONE HCL 100 MG PO TABS
100.0000 mg | ORAL_TABLET | Freq: Every day | ORAL | Status: DC
  Administered 2021-12-03 – 2021-12-09 (×6): 100 mg via ORAL
  Filled 2021-12-03 (×7): qty 1

## 2021-12-03 MED ORDER — FENTANYL CITRATE PF 50 MCG/ML IJ SOSY
50.0000 ug | PREFILLED_SYRINGE | INTRAMUSCULAR | Status: DC | PRN
  Administered 2021-12-03: 50 ug via INTRAVENOUS
  Filled 2021-12-03: qty 1

## 2021-12-03 MED ORDER — SODIUM CHLORIDE 0.9 % IV SOLN: 2.0000 g | INTRAVENOUS | Status: DC

## 2021-12-03 MED ORDER — LAMOTRIGINE 25 MG PO TABS
150.0000 mg | ORAL_TABLET | Freq: Two times a day (BID) | ORAL | Status: DC
  Administered 2021-12-03 – 2021-12-10 (×15): 150 mg via ORAL
  Filled 2021-12-03 (×15): qty 2

## 2021-12-03 MED ORDER — AMPHETAMINE-DEXTROAMPHETAMINE 10 MG PO TABS
20.0000 mg | ORAL_TABLET | Freq: Every day | ORAL | Status: DC
  Administered 2021-12-03 – 2021-12-10 (×8): 20 mg via ORAL
  Filled 2021-12-03 (×8): qty 2

## 2021-12-03 MED ORDER — POTASSIUM CHLORIDE CRYS ER 20 MEQ PO TBCR
40.0000 meq | EXTENDED_RELEASE_TABLET | Freq: Two times a day (BID) | ORAL | Status: AC
  Administered 2021-12-03 (×2): 40 meq via ORAL
  Filled 2021-12-03 (×2): qty 2

## 2021-12-03 NOTE — Plan of Care (Signed)

## 2021-12-03 NOTE — Progress Notes (Signed)
[facility] PROGRESS NOTE    Progress Note  Tammy Wilkinson  [id number] DOB: Sep 22, 1985 DOA: 12/02/2021 PCP: Patient, No Pcp Per (Inactive)     Brief Narrative:   Tammy Wilkinson is an 36 y.o. female past medical history significant for bipolar disorder asthma morbid obesity current smoker presents to the ED with worsening redness pain and swelling over the left arm the patient had a Nexplanon implant removed from her left arm on 11/30/2021 after this she went on to develop worsening redness and pain and swelling around the site    Assessment/Plan:   Left upper extremity nonpurulent cellulitis: Ultrasound was done that showed no drainable abscess. Cultures were drawn in the ED. Tmax of 98.9. Was started on IV Rocephin for nonpurulent cellulitis.  Strep pharyngitis: He had a positive group A strep, no apparent complications. IV Rocephin should cover.  Bipolar disorder ([place]) Continue current home regimen.  Hypokalemia: Replete orally recheck in the morning    DVT prophylaxis: lovenox Family Communication:none Status is: Inpatient Remains inpatient appropriate because: Painful left upper extremity cellulitis, strep pharyngitis and acute hypokalemia    Code Status:     Code Status Orders  (From admission, onward)           Start     Ordered   12/03/21 0718  Full code  Continuous        12/03/21 0719           Code Status History     Date Active Date Inactive Code Status Order ID Comments User Context   04/25/2014 0312 04/27/2014 1348 Full Code [id number]  Elveria Royals, MD Inpatient   04/24/2014 1435 04/25/2014 0312 Full Code [id number]  Precious Gilding, RN Inpatient   03/29/2013 1340 03/30/2013 1445 Full Code [id number]  Izora Gala, MD Inpatient         IV Access:   Peripheral IV   Procedures and diagnostic studies:   DG Chest 2 View  Result Date: 12/02/2021 CLINICAL DATA:  Suspected sepsis. EXAM: CHEST - 2 VIEW COMPARISON:   Chest x-ray 03/29/2013 FINDINGS: The heart size and mediastinal contours are within normal limits. Both lungs are clear. The visualized skeletal structures are unremarkable. IMPRESSION: No active cardiopulmonary disease. Electronically Signed   By: Ronney Asters M.D.   On: 12/02/2021 19:31   [place] LT UPPER EXTREM LTD SOFT TISSUE NON VASCULAR  Result Date: 12/02/2021 CLINICAL DATA:  Left upper arm swelling/redness x2 days, status post Nexplanon removal EXAM: ULTRASOUND LEFT UPPER EXTREMITY LIMITED TECHNIQUE: Ultrasound examination of the upper extremity soft tissues was performed in the area of clinical concern. COMPARISON:  None Available. FINDINGS: Soft tissue ultrasound was performed in the area of clinical concern (left upper extremity). Subcutaneous edema is present. No drainable fluid collection/abscess. IMPRESSION: Subcutaneous edema in the area of clinical concern, compatible with the clinical history of cellulitis. No drainable fluid collection/abscess. Electronically Signed   By: Julian Hy M.D.   On: 12/02/2021 21:01     Medical Consultants:   None.   Subjective:    Tammy Wilkinson relates her pain is not controlled poor appetite this morning she also relates being nauseated.  Objective:    Vitals:   12/03/21 0102 12/03/21 0104 12/03/21 0313 12/03/21 0450  BP:  128/83 105/60 109/60  Pulse:  (!) 121 (!) 112 (!) 118  Resp:  20 20 18   Temp:  98.7 F (37.1 C) 98.4 F (36.9 C) 97.9 F (36.6 C)  TempSrc:  Oral Oral   SpO2:  98% 96% 97%  Weight: 102.2 kg     Height: 5\' 4"  (1.626 m)      SpO2: 97 %   Intake/Output Summary (Last 24 hours) at 12/03/2021 0816 Last data filed at 12/03/2021 0552 Gross per 24 hour  Intake 2323.29 ml  Output --  Net 2323.29 ml   Filed Weights   12/02/21 1845 12/03/21 0102  Weight: 97 kg 102.2 kg    Exam: General exam: In no acute distress. Respiratory system: Good air movement and clear to auscultation. Cardiovascular system: S1 &  S2 heard, RRR. No JVD. Gastrointestinal system: Abdomen is nondistended, soft and nontender.  Extremities: No pedal edema. Skin: No rashes, lesions or ulcers Psychiatry: Judgement and insight appear normal. Mood & affect appropriate.    Data Reviewed:    Labs: Basic Metabolic Panel: Recent Labs  Lab 12/02/21 1902  NA 136  K 3.1*  CL 97*  CO2 25  GLUCOSE 160*  BUN 6  CREATININE 0.81  CALCIUM 9.2   GFR Estimated Creatinine Clearance: 112.8 mL/min (by C-G formula based on SCr of 0.81 mg/dL). Liver Function Tests: Recent Labs  Lab 12/02/21 1902  AST 15  ALT 14  ALKPHOS 58  BILITOT 0.8  PROT 7.1  ALBUMIN 4.0   No results for input(s): LIPASE, AMYLASE in the last 168 hours. No results for input(s): AMMONIA in the last 168 hours. Coagulation profile Recent Labs  Lab 12/02/21 1902  INR 1.0   COVID-19 Labs  No results for input(s): DDIMER, FERRITIN, LDH, CRP in the last 72 hours.  Lab Results  Component Value Date   Delta NEGATIVE 12/02/2021    CBC: Recent Labs  Lab 12/02/21 1902  WBC 17.5*  NEUTROABS 15.7*  HGB 13.5  HCT 39.4  MCV 94.7  PLT 255   Cardiac Enzymes: No results for input(s): CKTOTAL, CKMB, CKMBINDEX, TROPONINI in the last 168 hours. BNP (last 3 results) No results for input(s): PROBNP in the last 8760 hours. CBG: No results for input(s): GLUCAP in the last 168 hours. D-Dimer: No results for input(s): DDIMER in the last 72 hours. Hgb A1c: No results for input(s): HGBA1C in the last 72 hours. Lipid Profile: No results for input(s): CHOL, HDL, LDLCALC, TRIG, CHOLHDL, LDLDIRECT in the last 72 hours. Thyroid function studies: No results for input(s): TSH, T4TOTAL, T3FREE, THYROIDAB in the last 72 hours.  Invalid input(s): FREET3 Anemia work up: No results for input(s): VITAMINB12, FOLATE, FERRITIN, TIBC, IRON, RETICCTPCT in the last 72 hours. Sepsis Labs: Recent Labs  Lab 12/02/21 1902 12/02/21 2100  WBC 17.5*  --    LATICACIDVEN 2.6* 0.9   Microbiology Recent Results (from the past 240 hour(s))  Group A Strep by PCR     Status: Abnormal   Collection Time: 12/02/21  7:33 PM   Specimen: Throat; Sterile Swab  Result Value Ref Range Status   Group A Strep by PCR DETECTED (A) NOT DETECTED Final    Comment: Performed at [facility], [address], [place], [place] [address]  Resp Panel by RT-PCR (Flu A&B, Covid) Nasopharyngeal Swab     Status: None   Collection Time: 12/02/21  7:48 PM   Specimen: Nasopharyngeal Swab; Nasopharyngeal(NP) swabs in vial transport medium  Result Value Ref Range Status   SARS Coronavirus 2 by RT PCR NEGATIVE NEGATIVE Final    Comment: (NOTE) SARS-CoV-2 target nucleic acids are NOT DETECTED.  The SARS-CoV-2 RNA is generally detectable in upper respiratory specimens during the acute phase of infection. The  lowest concentration of SARS-CoV-2 viral copies this assay can detect is 138 copies/mL. A negative result does not preclude SARS-Cov-2 infection and should not be used as the sole basis for treatment or other patient management decisions. A negative result may occur with  improper specimen collection/handling, submission of specimen other than nasopharyngeal swab, presence of viral mutation(s) within the areas targeted by this assay, and inadequate number of viral copies(<138 copies/mL). A negative result must be combined with clinical observations, patient history, and epidemiological information. The expected result is Negative.  Fact Sheet for Patients:  EntrepreneurPulse.com.au  Fact Sheet for Healthcare Providers:  IncredibleEmployment.be  This test is no t yet approved or cleared by the [place] FDA and  has been authorized for detection and/or diagnosis of SARS-CoV-2 by FDA under an Emergency Use Authorization (EUA). This EUA will remain  in effect (meaning this test can be used) for the duration  of the COVID-19 declaration under Section 564(b)(1) of the Act, 21 U.S.C.section 360bbb-3(b)(1), unless the authorization is terminated  or revoked sooner.       Influenza A by PCR NEGATIVE NEGATIVE Final   Influenza B by PCR NEGATIVE NEGATIVE Final    Comment: (NOTE) The Xpert Xpress SARS-CoV-2/FLU/RSV plus assay is intended as an aid in the diagnosis of influenza from Nasopharyngeal swab specimens and should not be used as a sole basis for treatment. Nasal washings and aspirates are unacceptable for Xpert Xpress SARS-CoV-2/FLU/RSV testing.  Fact Sheet for Patients: EntrepreneurPulse.com.au  Fact Sheet for Healthcare Providers: IncredibleEmployment.be  This test is not yet approved or cleared by the [place] FDA and has been authorized for detection and/or diagnosis of SARS-CoV-2 by FDA under an Emergency Use Authorization (EUA). This EUA will remain in effect (meaning this test can be used) for the duration of the COVID-19 declaration under Section 564(b)(1) of the Act, 21 U.S.C. section 360bbb-3(b)(1), unless the authorization is terminated or revoked.  Performed at [facility], [address], [place], [place] [address]      Medications:    ARIPiprazole  5 mg Oral Daily   enoxaparin (LOVENOX) injection  50 mg Subcutaneous Q24H   lamoTRIgine  150 mg Oral BID   nicotine  21 mg Transdermal Daily   potassium chloride  20 mEq Oral Once   pregabalin  150 mg Oral Daily   traZODone  100 mg Oral QHS   venlafaxine XR  150 mg Oral Q breakfast   Continuous Infusions:  0.9 % NaCl with KCl 20 mEq / L     cefTRIAXone (ROCEPHIN)  IV        LOS: 0 days   Charlynne Cousins  [facility]  12/03/2021, 8:16 AM

## 2021-12-03 NOTE — H&P (Signed)
History and Physical    Tammy Wilkinson [id number] DOB: [birth year] DOA: 12/02/2021  PCP: Patient, No Pcp Per (Inactive)   Patient coming from: Home   Chief Complaint: Left arm swelling, pain, and redness   HPI: Tammy Wilkinson is a pleasant 36 y.o. female with medical history significant for bipolar disorder, asthma, BMI 39, and current smoker, now presenting to the emergency department with worsening redness, pain, and swelling involving her left arm.  Patient had Nexplanon implant removed from the medial aspect of her left arm on 11/30/2021 and went on to develop worsening redness, pain, and swelling surrounding the site.  There has been some thin clear and bloody drainage but no purulence.  She denies chills.  She also reports sore throat for the past few days, has had some pain with swallowing, but no difficulty swallowing and no noisy breathing.  [facility] ED Course: Upon arrival to the ED, patient is found to be afebrile and saturating well on room air with heart rate 120 and stable blood pressure.  Chemistry panel notable for potassium 3.1 and CBC for a leukocytosis to 17,500.  Initial lactic acid was 2.6.  Group A strep was positive.  Ultrasound of the left upper extremity soft tissue is negative for drainable collection/abscess.  Blood cultures were collected and the patient was treated with Rocephin, fentanyl, Advil, Percocet, and a liter of saline.  She was transferred to [facility] for admission.  Review of Systems:  All other systems reviewed and apart from HPI, are negative.  Past Medical History:  Diagnosis Date   ADHD (attention deficit hyperactivity disorder)    Allergy    Anemia    Anxiety    Asthma    pt states when she was a child   Concussion 12/30/14   Depression    Headache(784.0)    Hx; of migraines   Heartburn    hx:   Pneumonia    Shortness of breath     Past Surgical History:  Procedure Laterality Date   COLPOSCOPY      GYNECOLOGIC CRYOSURGERY     LARYNGOPLASTY Left 03/29/2013   Procedure: VOCAL CORD MEDIALIZATION;  Surgeon: Izora Gala, MD;  Location: [facility];  Service: ENT;  Laterality: Left;  with placement of silastic block   WISDOM TOOTH EXTRACTION      Social History:   reports that she has been smoking cigarettes. She has been smoking an average of .5 packs per day. She has never used smokeless tobacco. She reports that she does not currently use alcohol. She reports current drug use. Drug: Marijuana.  Allergies  Allergen Reactions   Latex Hives   Codeine Other (See Comments)   Amoxil [Amoxicillin] Other (See Comments)    Childhood reaction hallucinations   Penicillins Other (See Comments)    Childhood reaction hallucinations    Family History  Problem Relation Age of Onset   Hypertension Mother    Anxiety disorder Mother    Depression Mother    Physical abuse Mother    Hypertension Father    Drug abuse Sister    Anxiety disorder Sister    Depression Sister    Bipolar disorder Sister    Sexual abuse Sister    Schizophrenia Sister    Sexual abuse Sister    ADD / ADHD Sister    Depression Sister    Sexual abuse Sister    Drug abuse Sister    Anxiety disorder Sister    Depression Sister    Drug  abuse Sister    Alcohol abuse Brother    Drug abuse Brother    Depression Brother    Anxiety disorder Brother    ADD / ADHD Other    ADD / ADHD Other    Anesthesia problems Neg Hx      Prior to Admission medications   Medication Sig Start Date End Date Taking? Authorizing Provider  albuterol (VENTOLIN HFA) 108 (90 Base) MCG/ACT inhaler Inhale into the lungs. 12/02/18   [provider]  amphetamine-dextroamphetamine (ADDERALL) 10 MG tablet Take 10 mg by mouth 2 (two) times daily. 11/12/21   [provider]  ARIPiprazole (ABILIFY) 5 MG tablet Take 1 tablet (5 mg total) by mouth daily. 12/01/21   Arfeen, Arlyce Harman, MD  etonogestrel (NEXPLANON) 68 MG IMPL implant Inject into  the skin.    [provider]  ibuprofen (ADVIL) 800 MG tablet  05/14/19   [provider]  lamoTRIgine (LAMICTAL) 150 MG tablet Take 1 tablet (150 mg total) by mouth 2 (two) times daily. 10/26/21 01/24/22  Arfeen, Arlyce Harman, MD  levonorgestrel (MIRENA) 20 MCG/DAY IUD 1 each by Intrauterine route once. 11/30/21   [provider]  meloxicam (MOBIC) 7.5 MG tablet Take 1 tablet (7.5 mg total) by mouth daily. Can increase to twice daily if necessary Patient not taking: Reported on 11/22/2021 08/27/21   Hazel Sams, PA-C  naltrexone (DEPADE) 50 MG tablet Take 1 tablet (50 mg total) by mouth daily. Patient not taking: Reported on 10/26/2021 07/27/21   Arfeen, Arlyce Harman, MD  omeprazole (PRILOSEC) 10 MG capsule Take by mouth.    [provider]  pregabalin (LYRICA) 150 MG capsule Take 1 capsule (150 mg total) by mouth daily. 10/26/21   Arfeen, Arlyce Harman, MD  SUMAtriptan Dellis Filbert) 20 MG/ACT nasal spray  12/27/19   [provider]  traZODone (DESYREL) 100 MG tablet TAKE 1 TABLET(100 MG) BY MOUTH AT BEDTIME 10/26/21   Arfeen, Arlyce Harman, MD  venlafaxine XR (EFFEXOR-XR) 150 MG 24 hr capsule TAKE 1 CAPSULE(150 MG) BY MOUTH DAILY WITH BREAKFAST 12/01/21   Kathlee Nations, MD    Physical Exam: Vitals:   12/03/21 0102 12/03/21 0104 12/03/21 0313 12/03/21 0450  BP:  128/83 105/60 109/60  Pulse:  (!) 121 (!) 112 (!) 118  Resp:  20 20 18   Temp:  98.7 F (37.1 C) 98.4 F (36.9 C) 97.9 F (36.6 C)  TempSrc:  Oral Oral   SpO2:  98% 96% 97%  Weight: 102.2 kg     Height: 5\' 4"  (1.626 m)       Constitutional: NAD, calm  Eyes: PERTLA, lids and conjunctivae normal ENMT: Mucous membranes are moist. No gross deformity.    Neck: supple, anterior chain adenopathy Respiratory:  no wheezing, no crackles. No accessory muscle use.  Cardiovascular: Rate ~120 and regular. No significant JVD. Abdomen: No distension, no tenderness, soft. Bowel sounds active.  Musculoskeletal: no clubbing /  cyanosis. No joint deformity upper and lower extremities.   Skin: Heat, tenderness, erythema, and induration surround a puncture in medial left arm. Warm, dry, well-perfused. Neurologic: CN 2-12 grossly intact. Moving all extremities. Alert and oriented.  Psychiatric: Very pleasant. Cooperative.    Labs and Imaging on Admission: I have personally reviewed following labs and imaging studies  CBC: Recent Labs  Lab 12/02/21 1902  WBC 17.5*  NEUTROABS 15.7*  HGB 13.5  HCT 39.4  MCV 94.7  PLT 123456   Basic Metabolic Panel: Recent Labs  Lab 12/02/21  1902  NA 136  K 3.1*  CL 97*  CO2 25  GLUCOSE 160*  BUN 6  CREATININE 0.81  CALCIUM 9.2   GFR: Estimated Creatinine Clearance: 112.8 mL/min (by C-G formula based on SCr of 0.81 mg/dL). Liver Function Tests: Recent Labs  Lab 12/02/21 1902  AST 15  ALT 14  ALKPHOS 58  BILITOT 0.8  PROT 7.1  ALBUMIN 4.0   No results for input(s): LIPASE, AMYLASE in the last 168 hours. No results for input(s): AMMONIA in the last 168 hours. Coagulation Profile: Recent Labs  Lab 12/02/21 1902  INR 1.0   Cardiac Enzymes: No results for input(s): CKTOTAL, CKMB, CKMBINDEX, TROPONINI in the last 168 hours. BNP (last 3 results) No results for input(s): PROBNP in the last 8760 hours. HbA1C: No results for input(s): HGBA1C in the last 72 hours. CBG: No results for input(s): GLUCAP in the last 168 hours. Lipid Profile: No results for input(s): CHOL, HDL, LDLCALC, TRIG, CHOLHDL, LDLDIRECT in the last 72 hours. Thyroid Function Tests: No results for input(s): TSH, T4TOTAL, FREET4, T3FREE, THYROIDAB in the last 72 hours. Anemia Panel: No results for input(s): VITAMINB12, FOLATE, FERRITIN, TIBC, IRON, RETICCTPCT in the last 72 hours. Urine analysis:    Component Value Date/Time   COLORURINE YELLOW 12/02/2021 1933   APPEARANCEUR CLEAR 12/02/2021 1933   LABSPEC 1.028 12/02/2021 1933   PHURINE 6.0 12/02/2021 1933   GLUCOSEU NEGATIVE  12/02/2021 [place] NEGATIVE 12/02/2021 1933   BILIRUBINUR NEGATIVE 12/02/2021 1933   KETONESUR NEGATIVE 12/02/2021 1933   PROTEINUR TRACE (A) 12/02/2021 1933   UROBILINOGEN 0.2 01/11/2014 2050   NITRITE NEGATIVE 12/02/2021 1933   LEUKOCYTESUR NEGATIVE 12/02/2021 1933   Sepsis Labs: @LABRCNTIP (procalcitonin:4,lacticidven:4) ) Recent Results (from the past 240 hour(s))  Group A Strep by PCR     Status: Abnormal   Collection Time: 12/02/21  7:33 PM   Specimen: Throat; Sterile Swab  Result Value Ref Range Status   Group A Strep by PCR DETECTED (A) NOT DETECTED Final    Comment: Performed at [facility], [address], [place], [place] [address]  Resp Panel by RT-PCR (Flu A&B, Covid) Nasopharyngeal Swab     Status: None   Collection Time: 12/02/21  7:48 PM   Specimen: Nasopharyngeal Swab; Nasopharyngeal(NP) swabs in vial transport medium  Result Value Ref Range Status   SARS Coronavirus 2 by RT PCR NEGATIVE NEGATIVE Final    Comment: (NOTE) SARS-CoV-2 target nucleic acids are NOT DETECTED.  The SARS-CoV-2 RNA is generally detectable in upper respiratory specimens during the acute phase of infection. The lowest concentration of SARS-CoV-2 viral copies this assay can detect is 138 copies/mL. A negative result does not preclude SARS-Cov-2 infection and should not be used as the sole basis for treatment or other patient management decisions. A negative result may occur with  improper specimen collection/handling, submission of specimen other than nasopharyngeal swab, presence of viral mutation(s) within the areas targeted by this assay, and inadequate number of viral copies(<138 copies/mL). A negative result must be combined with clinical observations, patient history, and epidemiological information. The expected result is Negative.  Fact Sheet for Patients:  EntrepreneurPulse.com.au  Fact Sheet for Healthcare Providers:   IncredibleEmployment.be  This test is no t yet approved or cleared by the [place] FDA and  has been authorized for detection and/or diagnosis of SARS-CoV-2 by FDA under an Emergency Use Authorization (EUA). This EUA will remain  in effect (meaning this test can be used) for the  duration of the COVID-19 declaration under Section 564(b)(1) of the Act, 21 U.S.C.section 360bbb-3(b)(1), unless the authorization is terminated  or revoked sooner.       Influenza A by PCR NEGATIVE NEGATIVE Final   Influenza B by PCR NEGATIVE NEGATIVE Final    Comment: (NOTE) The Xpert Xpress SARS-CoV-2/FLU/RSV plus assay is intended as an aid in the diagnosis of influenza from Nasopharyngeal swab specimens and should not be used as a sole basis for treatment. Nasal washings and aspirates are unacceptable for Xpert Xpress SARS-CoV-2/FLU/RSV testing.  Fact Sheet for Patients: EntrepreneurPulse.com.au  Fact Sheet for Healthcare Providers: IncredibleEmployment.be  This test is not yet approved or cleared by the [place] FDA and has been authorized for detection and/or diagnosis of SARS-CoV-2 by FDA under an Emergency Use Authorization (EUA). This EUA will remain in effect (meaning this test can be used) for the duration of the COVID-19 declaration under Section 564(b)(1) of the Act, 21 U.S.C. section 360bbb-3(b)(1), unless the authorization is terminated or revoked.  Performed at [facility], [address] [place], [place] [address]      Radiological Exams on Admission: DG Chest 2 View  Result Date: 12/02/2021 CLINICAL DATA:  Suspected sepsis. EXAM: CHEST - 2 VIEW COMPARISON:  Chest x-ray 03/29/2013 FINDINGS: The heart size and mediastinal contours are within normal limits. Both lungs are clear. The visualized skeletal structures are unremarkable. IMPRESSION: No active cardiopulmonary disease. Electronically Signed    By: Ronney Asters M.D.   On: 12/02/2021 19:31   [place] LT UPPER EXTREM LTD SOFT TISSUE NON VASCULAR  Result Date: 12/02/2021 CLINICAL DATA:  Left upper arm swelling/redness x2 days, status post Nexplanon removal EXAM: ULTRASOUND LEFT UPPER EXTREMITY LIMITED TECHNIQUE: Ultrasound examination of the upper extremity soft tissues was performed in the area of clinical concern. COMPARISON:  None Available. FINDINGS: Soft tissue ultrasound was performed in the area of clinical concern (left upper extremity). Subcutaneous edema is present. No drainable fluid collection/abscess. IMPRESSION: Subcutaneous edema in the area of clinical concern, compatible with the clinical history of cellulitis. No drainable fluid collection/abscess. Electronically Signed   By: Julian Hy M.D.   On: 12/02/2021 21:01    Assessment/Plan   1. LUE cellulitis  - Pt had etonogestrel implant removed 5/16 and then developed progressive erythema, edema, heat, and pain surrounding the site  - She has marked leukocytosis and tachycardia but no fever, stable BP, and qSOFA score of 0  - No drainable collection seen on [place]  - Blood cultures were collected in ED and she was started on antibiotics  - Treat with Rocephin for non-purulent cellulitis, monitor cultures and clinical response to treatment, transition to oral antibiotic when clearly improving, and consider repeat imaging if fails to improve 48 hrs    2. Strep pharyngitis  - Pt reports several days of sore throat and has positive group A strep  - No apparent complications, Rocephin should cover this    3. Hypokalemia  - Serum potassium 3.1 in ED  - Replacing    4. Bipolar disorder  - Continue home regimen     DVT prophylaxis: Lovenox  Code Status: Full  Level of Care: Level of care: Telemetry Family Communication: none present  Disposition Plan:  Patient is from: home  Anticipated d/c is to: home  Anticipated d/c date is: 12/05/21  Patient currently: pending  improvement in cellulitis and transition to oral antibiotic  Consults called: none Admission status: Inpatient     Vianne Bulls, MD [facility]  12/03/2021, 7:20 AM

## 2021-12-03 NOTE — Progress Notes (Signed)
   12/03/21 0104  Assess: MEWS Score  Temp 98.7 F (37.1 C)  BP 128/83  Pulse Rate (!) 121  Resp 20  SpO2 98 %  O2 Device Room Air  Assess: MEWS Score  MEWS Temp 0  MEWS Systolic 0  MEWS Pulse 2  MEWS RR 0  MEWS LOC 0  MEWS Score 2  MEWS Score Color Yellow  Assess: if the MEWS score is Yellow or Red  Were vital signs taken at a resting state? Yes  Focused Assessment No change from prior assessment  Does the patient meet 2 or more of the SIRS criteria? No  MEWS guidelines implemented *See Row Information* Yes  Treat  MEWS Interventions Escalated (See documentation below)  Pain Scale 0-10  Pain Score 4  Take Vital Signs  Increase Vital Sign Frequency  Yellow: Q 2hr X 2 then Q 4hr X 2, if remains yellow, continue Q 4hrs  Escalate  MEWS: Escalate Yellow: discuss with charge nurse/RN and consider discussing with provider and RRT  Notify: Charge Nurse/RN  Name of Charge Nurse/RN Notified Jasen RN  Date Charge Nurse/RN Notified 12/03/21  Notify: Provider  Provider Name/Title Opyd MD  Date Provider Notified 12/03/21  Method of Notification  (secure chat)  Document  Patient Outcome Other (Comment) (remains on unit)  Assess: SIRS CRITERIA  SIRS Temperature  0  SIRS Pulse 1  SIRS Respirations  0  SIRS WBC 0  SIRS Score Sum  1

## 2021-12-03 NOTE — Progress Notes (Signed)
Pt arrived to unit. Page sent to admitting provider notifying of patient arrival. Awaiting orders. Will continue to monitor.

## 2021-12-04 LAB — BASIC METABOLIC PANEL
Anion gap: 6 (ref 5–15)
BUN: 10 mg/dL (ref 6–20)
CO2: 23 mmol/L (ref 22–32)
Calcium: 8.2 mg/dL — ABNORMAL LOW (ref 8.9–10.3)
Chloride: 109 mmol/L (ref 98–111)
Creatinine, Ser: 0.75 mg/dL (ref 0.44–1.00)
GFR, Estimated: >60 mL/min (ref >60)
Glucose, Bld: 123 mg/dL — ABNORMAL HIGH (ref 70–99)
Potassium: 3.9 mmol/L (ref 3.5–5.1)
Sodium: 138 mmol/L (ref 135–145)

## 2021-12-04 LAB — CBC
HCT: 34.2 % — ABNORMAL LOW (ref 36.0–46.0)
Hemoglobin: 11.4 g/dL — ABNORMAL LOW (ref 12.0–15.0)
MCH: 33.3 pg (ref 26.0–34.0)
MCHC: 33.3 g/dL (ref 30.0–36.0)
MCV: 100 fL (ref 80.0–100.0)
Platelets: 186 10*3/uL (ref 150–400)
RBC: 3.42 MIL/uL — ABNORMAL LOW (ref 3.87–5.11)
RDW: 13.8 % (ref 11.5–15.5)
WBC: 14.7 10*3/uL — ABNORMAL HIGH (ref 4.0–10.5)
nRBC: 0 % (ref 0.0–0.2)

## 2021-12-04 LAB — MAGNESIUM: Magnesium: 1.9 mg/dL (ref 1.7–2.4)

## 2021-12-04 LAB — HIV ANTIBODY (ROUTINE TESTING W REFLEX): HIV Screen 4th Generation wRfx: NONREACTIVE

## 2021-12-04 MED ORDER — OXYCODONE HCL 5 MG PO TABS
10.0000 mg | ORAL_TABLET | ORAL | Status: DC | PRN
  Administered 2021-12-04 – 2021-12-06 (×8): 10 mg via ORAL
  Filled 2021-12-04 (×8): qty 2

## 2021-12-04 MED ORDER — VANCOMYCIN HCL IN DEXTROSE 1-5 GM/200ML-% IV SOLN
1000.0000 mg | Freq: Two times a day (BID) | INTRAVENOUS | Status: DC
  Administered 2021-12-05 – 2021-12-06 (×5): 1000 mg via INTRAVENOUS
  Filled 2021-12-04 (×6): qty 200

## 2021-12-04 MED ORDER — VANCOMYCIN HCL 2000 MG/400ML IV SOLN
2000.0000 mg | Freq: Once | INTRAVENOUS | Status: AC
  Administered 2021-12-04: 2000 mg via INTRAVENOUS
  Filled 2021-12-04: qty 400

## 2021-12-04 NOTE — Progress Notes (Signed)
[facility] PROGRESS NOTE    Progress Note  Tammy Wilkinson  [id number] DOB: 10-06-1985 DOA: 12/02/2021 PCP: Patient, No Pcp Per (Inactive)     Brief Narrative:   Tammy Wilkinson is an 36 y.o. female past medical history significant for bipolar disorder asthma morbid obesity current smoker presents to the ED with worsening redness pain and swelling over the left arm the patient had a Nexplanon implant removed from her left arm on 11/30/2021 after this she went on to develop worsening redness and pain and swelling around the site    Assessment/Plan:   Left upper extremity nonpurulent cellulitis: Ultrasound was done that showed no drainable abscess. Culture data has remained negative till date. Tmax of 99 Continue IV Rocephin we will add IV vancomycin.  Strep pharyngitis: He had a positive group A strep, no apparent complications. IV Rocephin should cover.  Bipolar disorder ([place]) Continue current home regimen.  Hypokalemia: Replete orally recheck in the morning    DVT prophylaxis: lovenox Family Communication:none Status is: Inpatient Remains inpatient appropriate because: Painful left upper extremity cellulitis, strep pharyngitis and acute hypokalemia    Code Status:     Code Status Orders  (From admission, onward)           Start     Ordered   12/03/21 0718  Full code  Continuous        12/03/21 0719           Code Status History     Date Active Date Inactive Code Status Order ID Comments User Context   04/25/2014 0312 04/27/2014 1348 Full Code [id number]  Elveria Royals, MD Inpatient   04/24/2014 1435 04/25/2014 0312 Full Code [id number]  Precious Gilding, RN Inpatient   03/29/2013 1340 03/30/2013 1445 Full Code [id number]  Izora Gala, MD Inpatient         IV Access:   Peripheral IV   Procedures and diagnostic studies:   DG Chest 2 View  Result Date: 12/02/2021 CLINICAL DATA:  Suspected sepsis. EXAM: CHEST - 2 VIEW  COMPARISON:  Chest x-ray 03/29/2013 FINDINGS: The heart size and mediastinal contours are within normal limits. Both lungs are clear. The visualized skeletal structures are unremarkable. IMPRESSION: No active cardiopulmonary disease. Electronically Signed   By: Ronney Asters M.D.   On: 12/02/2021 19:31   [place] LT UPPER EXTREM LTD SOFT TISSUE NON VASCULAR  Result Date: 12/02/2021 CLINICAL DATA:  Left upper arm swelling/redness x2 days, status post Nexplanon removal EXAM: ULTRASOUND LEFT UPPER EXTREMITY LIMITED TECHNIQUE: Ultrasound examination of the upper extremity soft tissues was performed in the area of clinical concern. COMPARISON:  None Available. FINDINGS: Soft tissue ultrasound was performed in the area of clinical concern (left upper extremity). Subcutaneous edema is present. No drainable fluid collection/abscess. IMPRESSION: Subcutaneous edema in the area of clinical concern, compatible with the clinical history of cellulitis. No drainable fluid collection/abscess. Electronically Signed   By: Julian Hy M.D.   On: 12/02/2021 21:01     Medical Consultants:   None.   Subjective:    Tammy Wilkinson relates her pain is not controlled.  Objective:    Vitals:   12/03/21 2001 12/04/21 0016 12/04/21 0438 12/04/21 0906  BP: 106/75 131/86 119/76 101/65  Pulse: (!) 119 (!) 108 (!) 108 (!) 107  Resp: 16  18 18   Temp: 99 F (37.2 C) 98.1 F (36.7 C) 97.8 F (36.6 C) 97.8 F (36.6 C)  TempSrc: Oral Oral Oral Oral  SpO2: 90% 91% 93% 96%  Weight:      Height:       SpO2: 96 %   Intake/Output Summary (Last 24 hours) at 12/04/2021 0914 Last data filed at 12/03/2021 2155 Gross per 24 hour  Intake 1054 ml  Output --  Net 1054 ml    Filed Weights   12/02/21 1845 12/03/21 0102  Weight: 97 kg 102.2 kg    Exam: General exam: In no acute distress. Respiratory system: Good air movement and clear to auscultation. Cardiovascular system: S1 & S2 heard, RRR. No  JVD. Gastrointestinal system: Abdomen is nondistended, soft and nontender.  Extremities: No pedal edema. Skin: Left upper extremity continues to be erythematous and swollen. Psychiatry: Judgement and insight appear normal. Mood & affect appropriate..    Data Reviewed:    Labs: Basic Metabolic Panel: Recent Labs  Lab 12/02/21 1902 12/03/21 1240 12/04/21 0601  NA 136 135 138  K 3.1* 4.1 3.9  CL 97* 105 109  CO2 25 22 23   GLUCOSE 160* 151* 123*  BUN 6 8 10   CREATININE 0.81 0.97 0.75  CALCIUM 9.2 8.1* 8.2*  MG  --   --  1.9    GFR Estimated Creatinine Clearance: 114.2 mL/min (by C-G formula based on SCr of 0.75 mg/dL). Liver Function Tests: Recent Labs  Lab 12/02/21 1902  AST 15  ALT 14  ALKPHOS 58  BILITOT 0.8  PROT 7.1  ALBUMIN 4.0    No results for input(s): LIPASE, AMYLASE in the last 168 hours. No results for input(s): AMMONIA in the last 168 hours. Coagulation profile Recent Labs  Lab 12/02/21 1902  INR 1.0    COVID-19 Labs  No results for input(s): DDIMER, FERRITIN, LDH, CRP in the last 72 hours.  Lab Results  Component Value Date   [place] NEGATIVE 12/02/2021    CBC: Recent Labs  Lab 12/02/21 1902 12/04/21 0601  WBC 17.5* 14.7*  NEUTROABS 15.7*  --   HGB 13.5 11.4*  HCT 39.4 34.2*  MCV 94.7 100.0  PLT 255 186    Cardiac Enzymes: No results for input(s): CKTOTAL, CKMB, CKMBINDEX, TROPONINI in the last 168 hours. BNP (last 3 results) No results for input(s): PROBNP in the last 8760 hours. CBG: No results for input(s): GLUCAP in the last 168 hours. D-Dimer: No results for input(s): DDIMER in the last 72 hours. Hgb A1c: No results for input(s): HGBA1C in the last 72 hours. Lipid Profile: No results for input(s): CHOL, HDL, LDLCALC, TRIG, CHOLHDL, LDLDIRECT in the last 72 hours. Thyroid function studies: No results for input(s): TSH, T4TOTAL, T3FREE, THYROIDAB in the last 72 hours.  Invalid input(s): FREET3 Anemia work  up: No results for input(s): VITAMINB12, FOLATE, FERRITIN, TIBC, IRON, RETICCTPCT in the last 72 hours. Sepsis Labs: Recent Labs  Lab 12/02/21 1902 12/02/21 2100 12/04/21 0601  WBC 17.5*  --  14.7*  LATICACIDVEN 2.6* 0.9  --     Microbiology Recent Results (from the past 240 hour(s))  Culture, blood (Routine x 2)     Status: None (Preliminary result)   Collection Time: 12/02/21  7:03 PM   Specimen: BLOOD  Result Value Ref Range Status   Specimen Description   Final    BLOOD BLOOD RIGHT HAND Performed at [facility], [address] [place], [place] [address]    Special Requests   Final    BOTTLES DRAWN AEROBIC AND ANAEROBIC Blood Culture adequate volume Performed at [facility], [address], [place], [place] [address]    Culture  Final    NO GROWTH 2 DAYS Performed at [facility], [address] [place], [place] [address]    Report Status PENDING  Incomplete  Group A Strep by PCR     Status: Abnormal   Collection Time: 12/02/21  7:33 PM   Specimen: Throat; Sterile Swab  Result Value Ref Range Status   Group A Strep by PCR DETECTED (A) NOT DETECTED Final    Comment: Performed at [facility], [address], [place], [place] [address]  Resp Panel by RT-PCR (Flu A&B, Covid) Nasopharyngeal Swab     Status: None   Collection Time: 12/02/21  7:48 PM   Specimen: Nasopharyngeal Swab; Nasopharyngeal(NP) swabs in vial transport medium  Result Value Ref Range Status   SARS Coronavirus 2 by RT PCR NEGATIVE NEGATIVE Final    Comment: (NOTE) SARS-CoV-2 target nucleic acids are NOT DETECTED.  The SARS-CoV-2 RNA is generally detectable in upper respiratory specimens during the acute phase of infection. The lowest concentration of SARS-CoV-2 viral copies this assay can detect is 138 copies/mL. A negative result does not preclude SARS-Cov-2 infection and should not be used as the sole basis for treatment  or other patient management decisions. A negative result may occur with  improper specimen collection/handling, submission of specimen other than nasopharyngeal swab, presence of viral mutation(s) within the areas targeted by this assay, and inadequate number of viral copies(<138 copies/mL). A negative result must be combined with clinical observations, patient history, and epidemiological information. The expected result is Negative.  Fact Sheet for Patients:  BloggerCourse.com  Fact Sheet for Healthcare Providers:  SeriousBroker.it  This test is no t yet approved or cleared by the [place] FDA and  has been authorized for detection and/or diagnosis of SARS-CoV-2 by FDA under an Emergency Use Authorization (EUA). This EUA will remain  in effect (meaning this test can be used) for the duration of the COVID-19 declaration under Section 564(b)(1) of the Act, 21 U.S.C.section 360bbb-3(b)(1), unless the authorization is terminated  or revoked sooner.       Influenza A by PCR NEGATIVE NEGATIVE Final   Influenza B by PCR NEGATIVE NEGATIVE Final    Comment: (NOTE) The Xpert Xpress SARS-CoV-2/FLU/RSV plus assay is intended as an aid in the diagnosis of influenza from Nasopharyngeal swab specimens and should not be used as a sole basis for treatment. Nasal washings and aspirates are unacceptable for Xpert Xpress SARS-CoV-2/FLU/RSV testing.  Fact Sheet for Patients: BloggerCourse.com  Fact Sheet for Healthcare Providers: SeriousBroker.it  This test is not yet approved or cleared by the [place] FDA and has been authorized for detection and/or diagnosis of SARS-CoV-2 by FDA under an Emergency Use Authorization (EUA). This EUA will remain in effect (meaning this test can be used) for the duration of the COVID-19 declaration under Section 564(b)(1) of the Act, 21 U.S.C. section  360bbb-3(b)(1), unless the authorization is terminated or revoked.  Performed at [facility], [address] [place], [place] [address]   Culture, blood (Routine x 2)     Status: None (Preliminary result)   Collection Time: 12/02/21  9:00 PM   Specimen: BLOOD  Result Value Ref Range Status   Specimen Description   Final    BLOOD Performed at [facility], [address] [place], [place] [address]    Special Requests   Final    NONE Performed at [facility], [address], [place], [place] [address]    Culture  Final    NO GROWTH < 24 HOURS Performed at [facility], [place] [address] [place], [place] [address]    Report Status PENDING  Incomplete     Medications:    amphetamine-dextroamphetamine  20 mg Oral Q breakfast   ARIPiprazole  5 mg Oral Daily   enoxaparin (LOVENOX) injection  50 mg Subcutaneous Q24H   fluticasone  1 spray Each Nare Daily   lamoTRIgine  150 mg Oral BID   nicotine  21 mg Transdermal Daily   pantoprazole  40 mg Oral Daily   pregabalin  150 mg Oral Daily   traZODone  100 mg Oral QHS   venlafaxine XR  150 mg Oral Q breakfast   Continuous Infusions:  cefTRIAXone (ROCEPHIN)  IV 2 g (12/03/21 2022)   doxycycline (VIBRAMYCIN) IV 100 mg (12/04/21 0039)      LOS: 1 day   Charlynne Cousins  [facility]  12/04/2021, 9:14 AM

## 2021-12-04 NOTE — Progress Notes (Signed)
Pharmacy Antibiotic Note  Tammy Wilkinson is a 36 y.o. female admitted on 12/02/2021 with cellulitis related to Nexplanon implant removal on 5/16.  Pharmacy has been consulted for Vancomycin dosing.  Plan: Ceftriaxone per MD Vancomycin 2g IV x1, followed by 1g IV q12h.  (SCr 0.8, Vd 0.5, est AUC 523) Measure Vanc levels as needed.  Goal AUC = 400 - 550. Follow up renal function, culture results, and clinical course.   Height: 5\' 4"  (162.6 cm) Weight: 102.2 kg (225 lb 5 oz) IBW/kg (Calculated) : 54.7  Temp (24hrs), Avg:98.1 F (36.7 C), Min:97.7 F (36.5 C), Max:99 F (37.2 C)  Recent Labs  Lab 12/02/21 1902 12/02/21 2100 12/03/21 1240 12/04/21 0601  WBC 17.5*  --   --  14.7*  CREATININE 0.81  --  0.97 0.75  LATICACIDVEN 2.6* 0.9  --   --     Estimated Creatinine Clearance: 114.2 mL/min (by C-G formula based on SCr of 0.75 mg/dL).    Allergies  Allergen Reactions   Codeine Other (See Comments)    nightmares   Amoxil [Amoxicillin] Other (See Comments)    Childhood reaction  - hallucinations   Penicillins Other (See Comments)    Childhood reaction  - hallucinations   Latex Rash    Antimicrobials this admission: 5/18 Ceftriaxone >> (5/25) 5/18 Vancomycin x1, resumed 5/20 >>  5/19 Doxycycline >> 5/20  Dose adjustments this admission:   Microbiology results: 5/18 BCx: ngtd 5/18 Group A strep PCR (throat ): detected  Thank you for allowing pharmacy to be a part of this patient's care.  Gretta Arab PharmD, BCPS Clinical Pharmacist [facility] main pharmacy [phone] 12/04/2021 9:34 AM

## 2021-12-05 LAB — BASIC METABOLIC PANEL
Anion gap: 9 (ref 5–15)
BUN: 10 mg/dL (ref 6–20)
CO2: 21 mmol/L — ABNORMAL LOW (ref 22–32)
Calcium: 8.4 mg/dL — ABNORMAL LOW (ref 8.9–10.3)
Chloride: 106 mmol/L (ref 98–111)
Creatinine, Ser: 0.83 mg/dL (ref 0.44–1.00)
GFR, Estimated: >60 mL/min (ref >60)
Glucose, Bld: 118 mg/dL — ABNORMAL HIGH (ref 70–99)
Potassium: 4.2 mmol/L (ref 3.5–5.1)
Sodium: 136 mmol/L (ref 135–145)

## 2021-12-05 LAB — CBC
HCT: 33.3 % — ABNORMAL LOW (ref 36.0–46.0)
Hemoglobin: 10.5 g/dL — ABNORMAL LOW (ref 12.0–15.0)
MCH: 32.9 pg (ref 26.0–34.0)
MCHC: 31.5 g/dL (ref 30.0–36.0)
MCV: 104.4 fL — ABNORMAL HIGH (ref 80.0–100.0)
Platelets: 212 10*3/uL (ref 150–400)
RBC: 3.19 MIL/uL — ABNORMAL LOW (ref 3.87–5.11)
RDW: 14 % (ref 11.5–15.5)
WBC: 13.9 10*3/uL — ABNORMAL HIGH (ref 4.0–10.5)
nRBC: 0 % (ref 0.0–0.2)

## 2021-12-05 MED ORDER — CLINDAMYCIN PHOSPHATE 900 MG/50ML IV SOLN
900.0000 mg | Freq: Three times a day (TID) | INTRAVENOUS | Status: AC
  Administered 2021-12-05 – 2021-12-08 (×9): 900 mg via INTRAVENOUS
  Filled 2021-12-05 (×9): qty 50

## 2021-12-05 NOTE — Progress Notes (Signed)
[facility] PROGRESS NOTE    Progress Note  Tammy Wilkinson  QPR:[id number] DOB: 04/26/1986 DOA: 12/02/2021 PCP: Patient, No Pcp Per (Inactive)     Brief Narrative:   Tammy Wilkinson is an 36 y.o. female past medical history significant for bipolar disorder asthma morbid obesity current smoker presents to the ED with worsening redness pain and swelling over the left arm the patient had a Nexplanon implant removed from her left arm on 11/30/2021 after this she went on to develop worsening redness and pain and swelling around the site    Assessment/Plan:   Left upper extremity nonpurulent cellulitis: Ultrasound was done that showed no drainable abscess. Blood cultures are been negative. She has remained afebrile leukocytosis improving. Continue IV vancomycin and Rocephin. Her erythema is not improving is actually past the demarcated line we will go ahead and start IV clindamycin for the neck 72 hours.  Strep pharyngitis: He had a positive group A strep, no apparent complications. IV Rocephin should cover.  Bipolar disorder ([facility]) Continue current home regimen.  Hypokalemia: Replete orally recheck in the morning    DVT prophylaxis: lovenox Family Communication:none Status is: Inpatient Remains inpatient appropriate because: Painful left upper extremity cellulitis, strep pharyngitis and acute hypokalemia    Code Status:     Code Status Orders  (From admission, onward)           Start     Ordered   12/03/21 0718  Full code  Continuous        12/03/21 0719           Code Status History     Date Active Date Inactive Code Status Order ID Comments User Context   04/25/2014 0312 04/27/2014 1348 Full Code [id number]  Robley Fries, MD Inpatient   04/24/2014 1435 04/25/2014 0312 Full Code [id number]  Reita Chard, RN Inpatient   03/29/2013 1340 03/30/2013 1445 Full Code [id number]  Serena Colonel, MD Inpatient         IV Access:   Peripheral  IV   Procedures and diagnostic studies:   No results found.   Medical Consultants:   None.   Subjective:    Alisah Grandberry relates her pain is controlled but the erythema is expanding  Objective:    Vitals:   12/04/21 0906 12/04/21 1446 12/04/21 2025 12/05/21 0436  BP: 101/65 122/80 112/68 123/77  Pulse: (!) 107 (!) 41 87 97  Resp: 18 19 16 18   Temp: 97.8 F (36.6 C) 98.2 F (36.8 C) 97.6 F (36.4 C) 98.7 F (37.1 C)  TempSrc: Oral Oral Oral Oral  SpO2: 96% 95% 91% 94%  Weight:      Height:       SpO2: 94 %   Intake/Output Summary (Last 24 hours) at 12/05/2021 0908 Last data filed at 12/04/2021 1834 Gross per 24 hour  Intake 948 ml  Output --  Net 948 ml    Filed Weights   12/02/21 1845 12/03/21 0102  Weight: 97 kg 102.2 kg    Exam: General exam: In no acute distress. Respiratory system: Good air movement and clear to auscultation. Cardiovascular system: S1 & S2 heard, RRR. No JVD. Gastrointestinal system: Abdomen is nondistended, soft and nontender.  Extremities: No pedal edema. Skin: Left upper extremity appears erythematous erythema has expanded past the demarcated area warm and tender to touch Psychiatry: Judgement and insight appear normal. Mood & affect appropriate.   Data Reviewed:    Labs: Basic Metabolic Panel: Recent Labs  Lab 12/02/21  1902 12/03/21 1240 12/04/21 0601 12/05/21 0459  NA 136 135 138 136  K 3.1* 4.1 3.9 4.2  CL 97* 105 109 106  CO2 21*  GLUCOSE 160* 151* 123* 118*  BUN CREATININE 0.81 0.97 0.75 0.83  CALCIUM 9.2 8.1* 8.2* 8.4*  MG  --   --  1.9  --     GFR Estimated Creatinine Clearance: 110.1 mL/min (by C-G formula based on SCr of 0.83 mg/dL). Liver Function Tests: Recent Labs  Lab 12/02/21 1902  AST 15  ALT 14  ALKPHOS 58  BILITOT 0.8  PROT 7.1  ALBUMIN 4.0    No results for input(s): LIPASE, AMYLASE in the last 168 hours. No results for input(s): AMMONIA in the last 168  hours. Coagulation profile Recent Labs  Lab 12/02/21 1902  INR 1.0    COVID-19 Labs  No results for input(s): DDIMER, FERRITIN, LDH, CRP in the last 72 hours.  Lab Results  Component Value Date   SARSCOV2NAA NEGATIVE 12/02/2021    CBC: Recent Labs  Lab 12/02/21 1902 12/04/21 0601 12/05/21 0459  WBC 17.5* 14.7* 13.9*  NEUTROABS 15.7*  --   --   HGB 13.5 11.4* 10.5*  HCT 39.4 34.2* 33.3*  MCV 94.7 100.0 104.4*  PLT 255 186 212    Cardiac Enzymes: No results for input(s): CKTOTAL, CKMB, CKMBINDEX, TROPONINI in the last 168 hours. BNP (last 3 results) No results for input(s): PROBNP in the last 8760 hours. CBG: No results for input(s): GLUCAP in the last 168 hours. D-Dimer: No results for input(s): DDIMER in the last 72 hours. Hgb A1c: No results for input(s): HGBA1C in the last 72 hours. Lipid Profile: No results for input(s): CHOL, HDL, LDLCALC, TRIG, CHOLHDL, LDLDIRECT in the last 72 hours. Thyroid function studies: No results for input(s): TSH, T4TOTAL, T3FREE, THYROIDAB in the last 72 hours.  Invalid input(s): FREET3 Anemia work up: No results for input(s): VITAMINB12, FOLATE, FERRITIN, TIBC, IRON, RETICCTPCT in the last 72 hours. Sepsis Labs: Recent Labs  Lab 12/02/21 1902 12/02/21 2100 12/04/21 0601 12/05/21 0459  WBC 17.5*  --  14.7* 13.9*  LATICACIDVEN 2.6* 0.9  --   --     Microbiology Recent Results (from the past 240 hour(s))  Culture, blood (Routine x 2)     Status: None (Preliminary result)   Collection Time: 12/02/21  7:03 PM   Specimen: BLOOD  Result Value Ref Range Status   Specimen Description   Final    BLOOD BLOOD RIGHT HAND Performed at [facility], [address], [place], [place] [address]    Special Requests   Final    BOTTLES DRAWN AEROBIC AND ANAEROBIC Blood Culture adequate volume Performed at [facility], [address] [place], [place] [address]    Culture   Final    NO  GROWTH 3 DAYS Performed at [facility], [address], [place], [place] [address]    Report Status PENDING  Incomplete  Group A Strep by PCR     Status: Abnormal   Collection Time: 12/02/21  7:33 PM   Specimen: Throat; Sterile Swab  Result Value Ref Range Status   Group A Strep by PCR DETECTED (A) NOT DETECTED Final    Comment: Performed at [facility], [address], [place], [place] [address]  Resp Panel by RT-PCR (Flu A&B, Covid) Nasopharyngeal Swab     Status: None   Collection Time: 12/02/21  7:48  PM   Specimen: Nasopharyngeal Swab; Nasopharyngeal(NP) swabs in vial transport medium  Result Value Ref Range Status   SARS Coronavirus 2 by RT PCR NEGATIVE NEGATIVE Final    Comment: (NOTE) SARS-CoV-2 target nucleic acids are NOT DETECTED.  The SARS-CoV-2 RNA is generally detectable in upper respiratory specimens during the acute phase of infection. The lowest concentration of SARS-CoV-2 viral copies this assay can detect is 138 copies/mL. A negative result does not preclude SARS-Cov-2 infection and should not be used as the sole basis for treatment or other patient management decisions. A negative result may occur with  improper specimen collection/handling, submission of specimen other than nasopharyngeal swab, presence of viral mutation(s) within the areas targeted by this assay, and inadequate number of viral copies(<138 copies/mL). A negative result must be combined with clinical observations, patient history, and epidemiological information. The expected result is Negative.  Fact Sheet for Patients:  BloggerCourse.comhttps://www.fda.gov/media/152166/download  Fact Sheet for Healthcare Providers:  SeriousBroker.ithttps://www.fda.gov/media/152162/download  This test is no t yet approved or cleared by the [place] FDA and  has been authorized for detection and/or diagnosis of SARS-CoV-2 by FDA under an Emergency Use Authorization (EUA). This EUA will remain  in effect  (meaning this test can be used) for the duration of the COVID-19 declaration under Section 564(b)(1) of the Act, 21 U.S.C.section 360bbb-3(b)(1), unless the authorization is terminated  or revoked sooner.       Influenza A by PCR NEGATIVE NEGATIVE Final   Influenza B by PCR NEGATIVE NEGATIVE Final    Comment: (NOTE) The Xpert Xpress SARS-CoV-2/FLU/RSV plus assay is intended as an aid in the diagnosis of influenza from Nasopharyngeal swab specimens and should not be used as a sole basis for treatment. Nasal washings and aspirates are unacceptable for Xpert Xpress SARS-CoV-2/FLU/RSV testing.  Fact Sheet for Patients: BloggerCourse.comhttps://www.fda.gov/media/152166/download  Fact Sheet for Healthcare Providers: SeriousBroker.ithttps://www.fda.gov/media/152162/download  This test is not yet approved or cleared by the [place] FDA and has been authorized for detection and/or diagnosis of SARS-CoV-2 by FDA under an Emergency Use Authorization (EUA). This EUA will remain in effect (meaning this test can be used) for the duration of the COVID-19 declaration under Section 564(b)(1) of the Act, 21 U.S.C. section 360bbb-3(b)(1), unless the authorization is terminated or revoked.  Performed at [facility], [address], [place], [place] [address]   Culture, blood (Routine x 2)     Status: None (Preliminary result)   Collection Time: 12/02/21  9:00 PM   Specimen: BLOOD  Result Value Ref Range Status   Specimen Description   Final    BLOOD Performed at [facility], [address], [place], [place] [address]    Special Requests   Final    NONE Performed at [facility], [address], [place], [place] [address]    Culture   Final    NO GROWTH 2 DAYS Performed at [facility], [address] [place], [place] [address]    Report Status PENDING  Incomplete     Medications:    amphetamine-dextroamphetamine  20 mg Oral Q breakfast    ARIPiprazole  5 mg Oral Daily   enoxaparin (LOVENOX) injection  50 mg Subcutaneous Q24H   fluticasone  1 spray Each Nare Daily   lamoTRIgine  150 mg Oral BID   nicotine  21 mg Transdermal Daily   pantoprazole  40 mg Oral Daily   pregabalin  150 mg Oral Daily   traZODone  100 mg Oral QHS  venlafaxine XR  150 mg Oral Q breakfast   Continuous Infusions:  cefTRIAXone (ROCEPHIN)  IV 2 g (12/04/21 2111)   vancomycin 1,000 mg (12/05/21 0011)      LOS: 2 days   Marinda Elk  [facility]  12/05/2021, 9:08 AM

## 2021-12-06 ENCOUNTER — Inpatient Hospital Stay (HOSPITAL_COMMUNITY): Payer: 59

## 2021-12-06 MED ORDER — OXYCODONE HCL 5 MG PO TABS
15.0000 mg | ORAL_TABLET | ORAL | Status: DC | PRN
  Administered 2021-12-06 – 2021-12-10 (×30): 15 mg via ORAL
  Filled 2021-12-06 (×30): qty 3

## 2021-12-06 NOTE — Progress Notes (Signed)
[facility] PROGRESS NOTE    Progress Note  Tammy Wilkinson  GNF:[id number] DOB: 26-Feb-1986 DOA: 12/02/2021 PCP: Patient, No Pcp Per (Inactive)     Brief Narrative:   Tammy Wilkinson is an 36 y.o. female past medical history significant for bipolar disorder asthma morbid obesity current smoker presents to the ED with worsening redness pain and swelling over the left arm the patient had a Nexplanon implant removed from her left arm on 11/30/2021 after this she went on to develop worsening redness and pain and swelling around the site    Assessment/Plan:   Left upper extremity nonpurulent cellulitis: On admission ultrasound was done that showed no drainable abscess, this morning is an injury to the area. We will go ahead and repeat ultrasound to rule out abscess. Culture data has remained negative till date. Continues to be afebrile leukocytosis improving. Continue IV vancomycin, clindamycin and Rocephin. Continue clindamycin for an additional 48 hours.  Strep pharyngitis: He had a positive group A strep, no apparent complications. IV Rocephin should cover.  Bipolar disorder (HCC) Continue current home regimen.  Hypokalemia: Replete orally recheck in the morning    DVT prophylaxis: lovenox Family Communication:none Status is: Inpatient Remains inpatient appropriate because: Painful left upper extremity cellulitis, strep pharyngitis and acute hypokalemia    Code Status:     Code Status Orders  (From admission, onward)           Start     Ordered   12/03/21 0718  Full code  Continuous        12/03/21 0719           Code Status History     Date Active Date Inactive Code Status Order ID Comments User Context   04/25/2014 0312 04/27/2014 1348 Full Code [id number]  Robley Fries, MD Inpatient   04/24/2014 1435 04/25/2014 0312 Full Code [id number]  Reita Chard, RN Inpatient   03/29/2013 1340 03/30/2013 1445 Full Code [id number]  Serena Colonel, MD  Inpatient         IV Access:   Peripheral IV   Procedures and diagnostic studies:   No results found.   Medical Consultants:   None.   Subjective:    Tammy Wilkinson relates her pain is not controlled this morning. Objective:    Vitals:   12/05/21 0436 12/05/21 1430 12/05/21 2006 12/06/21 0619  BP: 123/77 121/67 123/72 (!) 149/92  Pulse: 97 89 91 80  Resp: Temp: 98.7 F (37.1 C) 97.9 F (36.6 C) 98.1 F (36.7 C) 97.7 F (36.5 C)  TempSrc: Oral Oral Oral Oral  SpO2: 94% 95% 92% 97%  Weight:      Height:       SpO2: 97 %   Intake/Output Summary (Last 24 hours) at 12/06/2021 0852 Last data filed at 12/05/2021 1300 Gross per 24 hour  Intake 600 ml  Output --  Net 600 ml    Filed Weights   12/02/21 1845 12/03/21 0102  Weight: 97 kg 102.2 kg    Exam: General exam: In no acute distress. Respiratory system: Good air movement and clear to auscultation. Cardiovascular system: S1 & S2 heard, RRR. No JVD. Gastrointestinal system: Abdomen is nondistended, soft and nontender.  Central nervous system: Alert and oriented. No focal neurological deficits. Extremities: No pedal edema. Skin: Left lower extremity erythema is extending beyond the demarcation I have marked it again today, there is appears to be a fluctuation, tender and warm to touch.  Data Reviewed:    Labs: Basic Metabolic Panel: Recent Labs  Lab 12/02/21 1902 12/03/21 1240 12/04/21 0601 12/05/21 0459  NA 136 135 138 136  K 3.1* 4.1 3.9 4.2  CL 97* 105 109 106  CO2 25 22 23  21*  GLUCOSE 160* 151* 123* 118*  BUN 6 8 10 10   CREATININE 0.81 0.97 0.75 0.83  CALCIUM 9.2 8.1* 8.2* 8.4*  MG  --   --  1.9  --     GFR Estimated Creatinine Clearance: 110.1 mL/min (by C-G formula based on SCr of 0.83 mg/dL). Liver Function Tests: Recent Labs  Lab 12/02/21 1902  AST 15  ALT 14  ALKPHOS 58  BILITOT 0.8  PROT 7.1  ALBUMIN 4.0    No results for input(s): LIPASE,  AMYLASE in the last 168 hours. No results for input(s): AMMONIA in the last 168 hours. Coagulation profile Recent Labs  Lab 12/02/21 1902  INR 1.0    COVID-19 Labs  No results for input(s): DDIMER, FERRITIN, LDH, CRP in the last 72 hours.  Lab Results  Component Value Date   SARSCOV2NAA NEGATIVE 12/02/2021    CBC: Recent Labs  Lab 12/02/21 1902 12/04/21 0601 12/05/21 0459  WBC 17.5* 14.7* 13.9*  NEUTROABS 15.7*  --   --   HGB 13.5 11.4* 10.5*  HCT 39.4 34.2* 33.3*  MCV 94.7 100.0 104.4*  PLT 255 186 212    Cardiac Enzymes: No results for input(s): CKTOTAL, CKMB, CKMBINDEX, TROPONINI in the last 168 hours. BNP (last 3 results) No results for input(s): PROBNP in the last 8760 hours. CBG: No results for input(s): GLUCAP in the last 168 hours. D-Dimer: No results for input(s): DDIMER in the last 72 hours. Hgb A1c: No results for input(s): HGBA1C in the last 72 hours. Lipid Profile: No results for input(s): CHOL, HDL, LDLCALC, TRIG, CHOLHDL, LDLDIRECT in the last 72 hours. Thyroid function studies: No results for input(s): TSH, T4TOTAL, T3FREE, THYROIDAB in the last 72 hours.  Invalid input(s): FREET3 Anemia work up: No results for input(s): VITAMINB12, FOLATE, FERRITIN, TIBC, IRON, RETICCTPCT in the last 72 hours. Sepsis Labs: Recent Labs  Lab 12/02/21 1902 12/02/21 2100 12/04/21 0601 12/05/21 0459  WBC 17.5*  --  14.7* 13.9*  LATICACIDVEN 2.6* 0.9  --   --     Microbiology Recent Results (from the past 240 hour(s))  Culture, blood (Routine x 2)     Status: None (Preliminary result)   Collection Time: 12/02/21  7:03 PM   Specimen: BLOOD  Result Value Ref Range Status   Specimen Description   Final    BLOOD BLOOD RIGHT HAND Performed at [facility], [address], [place], [place] [address]    Special Requests   Final    BOTTLES DRAWN AEROBIC AND ANAEROBIC Blood Culture adequate volume Performed at [facility], [address], [place], [place] [address]    Culture   Final    NO GROWTH 4 DAYS Performed at [facility], [address] [place], [place] [address]    Report Status PENDING  Incomplete  Group A Strep by PCR     Status: Abnormal   Collection Time: 12/02/21  7:33 PM   Specimen: Throat; Sterile Swab  Result Value Ref Range Status   Group A Strep by PCR DETECTED (A) NOT DETECTED Final    Comment: Performed at [facility], [address], [place], [place] [address]  Resp Panel by RT-PCR (Flu A&B, Covid) Nasopharyngeal  Swab     Status: None   Collection Time: 12/02/21  7:48 PM   Specimen: Nasopharyngeal Swab; Nasopharyngeal(NP) swabs in vial transport medium  Result Value Ref Range Status   SARS Coronavirus 2 by RT PCR NEGATIVE NEGATIVE Final    Comment: (NOTE) SARS-CoV-2 target nucleic acids are NOT DETECTED.  The SARS-CoV-2 RNA is generally detectable in upper respiratory specimens during the acute phase of infection. The lowest concentration of SARS-CoV-2 viral copies this assay can detect is 138 copies/mL. A negative result does not preclude SARS-Cov-2 infection and should not be used as the sole basis for treatment or other patient management decisions. A negative result may occur with  improper specimen collection/handling, submission of specimen other than nasopharyngeal swab, presence of viral mutation(s) within the areas targeted by this assay, and inadequate number of viral copies(<138 copies/mL). A negative result must be combined with clinical observations, patient history, and epidemiological information. The expected result is Negative.  Fact Sheet for Patients:  BloggerCourse.com  Fact Sheet for Healthcare Providers:  SeriousBroker.it  This test is no t yet approved or cleared by the [place] FDA and  has been authorized for detection and/or diagnosis of SARS-CoV-2  by FDA under an Emergency Use Authorization (EUA). This EUA will remain  in effect (meaning this test can be used) for the duration of the COVID-19 declaration under Section 564(b)(1) of the Act, 21 U.S.C.section 360bbb-3(b)(1), unless the authorization is terminated  or revoked sooner.       Influenza A by PCR NEGATIVE NEGATIVE Final   Influenza B by PCR NEGATIVE NEGATIVE Final    Comment: (NOTE) The Xpert Xpress SARS-CoV-2/FLU/RSV plus assay is intended as an aid in the diagnosis of influenza from Nasopharyngeal swab specimens and should not be used as a sole basis for treatment. Nasal washings and aspirates are unacceptable for Xpert Xpress SARS-CoV-2/FLU/RSV testing.  Fact Sheet for Patients: BloggerCourse.com  Fact Sheet for Healthcare Providers: SeriousBroker.it  This test is not yet approved or cleared by the [place] FDA and has been authorized for detection and/or diagnosis of SARS-CoV-2 by FDA under an Emergency Use Authorization (EUA). This EUA will remain in effect (meaning this test can be used) for the duration of the COVID-19 declaration under Section 564(b)(1) of the Act, 21 U.S.C. section 360bbb-3(b)(1), unless the authorization is terminated or revoked.  Performed at [facility], [address] [place], [place] [address]   Culture, blood (Routine x 2)     Status: None (Preliminary result)   Collection Time: 12/02/21  9:00 PM   Specimen: BLOOD  Result Value Ref Range Status   Specimen Description   Final    BLOOD Performed at [facility], [address], [place], [place] [address]    Special Requests   Final    NONE Performed at [facility], [address], [place], [place] [address]    Culture   Final    NO GROWTH 3 DAYS Performed at [facility], [address], [place], [place] [address]    Report Status PENDING  Incomplete      Medications:    amphetamine-dextroamphetamine  20 mg Oral Q breakfast   ARIPiprazole  5 mg Oral Daily   enoxaparin (LOVENOX) injection  50 mg Subcutaneous Q24H   fluticasone  1 spray Each Nare Daily   lamoTRIgine  150 mg Oral BID   nicotine  21 mg Transdermal Daily   pantoprazole  40 mg Oral Daily   pregabalin  150 mg Oral Daily   traZODone  100 mg Oral QHS   venlafaxine XR  150 mg Oral Q breakfast   Continuous Infusions:  cefTRIAXone (ROCEPHIN)  IV 2 g (12/05/21 2059)   clindamycin (CLEOCIN) IV 900 mg (12/06/21 0244)   vancomycin 1,000 mg (12/05/21 2352)      LOS: 3 days   Marinda Elk  [facility]  12/06/2021, 8:52 AM

## 2021-12-06 NOTE — Plan of Care (Signed)

## 2021-12-07 LAB — CBC
HCT: 32.5 % — ABNORMAL LOW (ref 36.0–46.0)
Hemoglobin: 11 g/dL — ABNORMAL LOW (ref 12.0–15.0)
MCH: 32.6 pg (ref 26.0–34.0)
MCHC: 33.8 g/dL (ref 30.0–36.0)
MCV: 96.4 fL (ref 80.0–100.0)
Platelets: 301 10*3/uL (ref 150–400)
RBC: 3.37 MIL/uL — ABNORMAL LOW (ref 3.87–5.11)
RDW: 13.9 % (ref 11.5–15.5)
WBC: 11.1 10*3/uL — ABNORMAL HIGH (ref 4.0–10.5)
nRBC: 0.2 % (ref 0.0–0.2)

## 2021-12-07 LAB — BASIC METABOLIC PANEL
Anion gap: 10 (ref 5–15)
BUN: <5 mg/dL — ABNORMAL LOW (ref 6–20)
CO2: 28 mmol/L (ref 22–32)
Calcium: 8.8 mg/dL — ABNORMAL LOW (ref 8.9–10.3)
Chloride: 104 mmol/L (ref 98–111)
Creatinine, Ser: 0.76 mg/dL (ref 0.44–1.00)
GFR, Estimated: >60 mL/min (ref >60)
Glucose, Bld: 93 mg/dL (ref 70–99)
Potassium: 3.3 mmol/L — ABNORMAL LOW (ref 3.5–5.1)
Sodium: 142 mmol/L (ref 135–145)

## 2021-12-07 LAB — CULTURE, BLOOD (ROUTINE X 2)
Culture: NO GROWTH
Special Requests: ADEQUATE

## 2021-12-07 MED ORDER — POTASSIUM CHLORIDE CRYS ER 20 MEQ PO TBCR
40.0000 meq | EXTENDED_RELEASE_TABLET | Freq: Two times a day (BID) | ORAL | Status: AC
  Administered 2021-12-07 (×2): 40 meq via ORAL
  Filled 2021-12-07 (×2): qty 2

## 2021-12-07 NOTE — Progress Notes (Signed)
[facility] PROGRESS NOTE    Progress Note  Tammy Wilkinson  OZH:[id number] DOB: 22-May-1986 DOA: 12/02/2021 PCP: Patient, No Pcp Per (Inactive)     Brief Narrative:   Tammy Wilkinson is an 36 y.o. female past medical history significant for bipolar disorder asthma morbid obesity current smoker presents to the ED with worsening redness pain and swelling over the left arm the patient had a Nexplanon implant removed from her left arm on 11/30/2021 after this she went on to develop worsening redness and pain and swelling around the site    Assessment/Plan:   Left upper extremity nonpurulent cellulitis: Ultrasound showed no abscesses. Erythema is receding. Blood cultures remain negative till date. She has remained afebrile leukocytosis is improving. We will de-escalate IV Rocephin and clindamycin  Strep pharyngitis: He had a positive group A strep, no apparent complications. IV Rocephin should cover.  Bipolar disorder (HCC) Continue current home regimen.  Hypokalemia: Replete orally recheck in the morning    DVT prophylaxis: lovenox Family Communication:none Status is: Inpatient Remains inpatient appropriate because: Painful left upper extremity cellulitis, strep pharyngitis and acute hypokalemia    Code Status:     Code Status Orders  (From admission, onward)           Start     Ordered   12/03/21 0718  Full code  Continuous        12/03/21 0719           Code Status History     Date Active Date Inactive Code Status Order ID Comments User Context   04/25/2014 0312 04/27/2014 1348 Full Code [id number]  Robley Fries, MD Inpatient   04/24/2014 1435 04/25/2014 0312 Full Code [id number]  Reita Chard, RN Inpatient   03/29/2013 1340 03/30/2013 1445 Full Code [id number]  Serena Colonel, MD Inpatient         IV Access:   Peripheral IV   Procedures and diagnostic studies:   [place] LT UPPER EXTREM LTD SOFT TISSUE NON VASCULAR  Result Date:  12/06/2021 CLINICAL DATA:  Upper extremity abscess. Status post Nexplanon removal 11/30/2021. EXAM: ULTRASOUND LEFT UPPER EXTREMITY LIMITED TECHNIQUE: Ultrasound examination of the upper extremity soft tissues was performed in the area of clinical concern. COMPARISON:  Left upper extremity soft tissue ultrasound 12/02/2021 FINDINGS: In the area of interest of the medial aspect of the left upper arm, there is again subcutaneous edema. No walled-off fluid collection or abscess is visualized. IMPRESSION: No significant change from prior. Subcutaneous edema in the area of clinical concern compatible with cellulitis. No drainable fluid collection or abscess is identified. Electronically Signed   By: Neita Garnet M.D.   On: 12/06/2021 09:36     Medical Consultants:   None.   Subjective:    Tammy Wilkinson relates her pain is controlled today still hurts when mobilizing it or touching it Objective:    Vitals:   12/06/21 0619 12/06/21 1532 12/06/21 2030 12/07/21 0545  BP: (!) 149/92 (!) 143/101 (!) 162/102 (!) 158/89  Pulse: 80 87 85 93  Resp: Temp: 97.7 F (36.5 C) 97.6 F (36.4 C) 98.1 F (36.7 C) 97.6 F (36.4 C)  TempSrc: Oral Oral Oral Oral  SpO2: 97% 97% 99% 95%  Weight:      Height:       SpO2: 95 %   Intake/Output Summary (Last 24 hours) at 12/07/2021 0817 Last data filed at 12/06/2021 2034 Gross per 24 hour  Intake 1640 ml  Output --  Net 1640 ml    Filed Weights   12/02/21 1845 12/03/21 0102  Weight: 97 kg 102.2 kg    Exam: General exam: In no acute distress. Respiratory system: Good air movement and clear to auscultation. Cardiovascular system: S1 & S2 heard, RRR. No JVD. Gastrointestinal system: Abdomen is nondistended, soft and nontender.  Central nervous system: Alert and oriented. No focal neurological deficits. Extremities: No pedal edema. Psychiatry: Judgement and insight appear normal. Mood & affect appropriate. Skin: Left lower extremity  erythema there is appears to be a fluctuation, tender and warm to touch.  It seems like her erythema is receding from the demarcated areas   Data Reviewed:    Labs: Basic Metabolic Panel: Recent Labs  Lab 12/02/21 1902 12/03/21 1240 12/04/21 0601 12/05/21 0459  NA 136 135 138 136  K 3.1* 4.1 3.9 4.2  CL 97* 105 109 106  CO2 25 22 23  21*  GLUCOSE 160* 151* 123* 118*  BUN 6 8 10 10   CREATININE 0.81 0.97 0.75 0.83  CALCIUM 9.2 8.1* 8.2* 8.4*  MG  --   --  1.9  --     GFR Estimated Creatinine Clearance: 110.1 mL/min (by C-G formula based on SCr of 0.83 mg/dL). Liver Function Tests: Recent Labs  Lab 12/02/21 1902  AST 15  ALT 14  ALKPHOS 58  BILITOT 0.8  PROT 7.1  ALBUMIN 4.0    No results for input(s): LIPASE, AMYLASE in the last 168 hours. No results for input(s): AMMONIA in the last 168 hours. Coagulation profile Recent Labs  Lab 12/02/21 1902  INR 1.0    COVID-19 Labs  No results for input(s): DDIMER, FERRITIN, LDH, CRP in the last 72 hours.  Lab Results  Component Value Date   SARSCOV2NAA NEGATIVE 12/02/2021    CBC: Recent Labs  Lab 12/02/21 1902 12/04/21 0601 12/05/21 0459 12/07/21 0746  WBC 17.5* 14.7* 13.9* 11.1*  NEUTROABS 15.7*  --   --   --   HGB 13.5 11.4* 10.5* 11.0*  HCT 39.4 34.2* 33.3* 32.5*  MCV 94.7 100.0 104.4* 96.4  PLT 255 186 212 301    Cardiac Enzymes: No results for input(s): CKTOTAL, CKMB, CKMBINDEX, TROPONINI in the last 168 hours. BNP (last 3 results) No results for input(s): PROBNP in the last 8760 hours. CBG: No results for input(s): GLUCAP in the last 168 hours. D-Dimer: No results for input(s): DDIMER in the last 72 hours. Hgb A1c: No results for input(s): HGBA1C in the last 72 hours. Lipid Profile: No results for input(s): CHOL, HDL, LDLCALC, TRIG, CHOLHDL, LDLDIRECT in the last 72 hours. Thyroid function studies: No results for input(s): TSH, T4TOTAL, T3FREE, THYROIDAB in the last 72 hours.  Invalid  input(s): FREET3 Anemia work up: No results for input(s): VITAMINB12, FOLATE, FERRITIN, TIBC, IRON, RETICCTPCT in the last 72 hours. Sepsis Labs: Recent Labs  Lab 12/02/21 1902 12/02/21 2100 12/04/21 0601 12/05/21 0459 12/07/21 0746  WBC 17.5*  --  14.7* 13.9* 11.1*  LATICACIDVEN 2.6* 0.9  --   --   --     Microbiology Recent Results (from the past 240 hour(s))  Culture, blood (Routine x 2)     Status: None (Preliminary result)   Collection Time: 12/02/21  7:03 PM   Specimen: BLOOD  Result Value Ref Range Status   Specimen Description   Final    BLOOD BLOOD RIGHT HAND Performed at [facility], [address], [place], [place] [address]    Special Requests   Final  BOTTLES DRAWN AEROBIC AND ANAEROBIC Blood Culture adequate volume Performed at [facility], [address] [place], [place] [address]    Culture   Final    NO GROWTH 4 DAYS Performed at [facility], [address], [place], [place] [address]    Report Status PENDING  Incomplete  Group A Strep by PCR     Status: Abnormal   Collection Time: 12/02/21  7:33 PM   Specimen: Throat; Sterile Swab  Result Value Ref Range Status   Group A Strep by PCR DETECTED (A) NOT DETECTED Final    Comment: Performed at [facility], [address], [place], [place] [address]  Resp Panel by RT-PCR (Flu A&B, Covid) Nasopharyngeal Swab     Status: None   Collection Time: 12/02/21  7:48 PM   Specimen: Nasopharyngeal Swab; Nasopharyngeal(NP) swabs in vial transport medium  Result Value Ref Range Status   SARS Coronavirus 2 by RT PCR NEGATIVE NEGATIVE Final    Comment: (NOTE) SARS-CoV-2 target nucleic acids are NOT DETECTED.  The SARS-CoV-2 RNA is generally detectable in upper respiratory specimens during the acute phase of infection. The lowest concentration of SARS-CoV-2 viral copies this assay can detect is 138 copies/mL. A negative result does not preclude  SARS-Cov-2 infection and should not be used as the sole basis for treatment or other patient management decisions. A negative result may occur with  improper specimen collection/handling, submission of specimen other than nasopharyngeal swab, presence of viral mutation(s) within the areas targeted by this assay, and inadequate number of viral copies(<138 copies/mL). A negative result must be combined with clinical observations, patient history, and epidemiological information. The expected result is Negative.  Fact Sheet for Patients:  BloggerCourse.com  Fact Sheet for Healthcare Providers:  SeriousBroker.it  This test is no t yet approved or cleared by the [place] FDA and  has been authorized for detection and/or diagnosis of SARS-CoV-2 by FDA under an Emergency Use Authorization (EUA). This EUA will remain  in effect (meaning this test can be used) for the duration of the COVID-19 declaration under Section 564(b)(1) of the Act, 21 U.S.C.section 360bbb-3(b)(1), unless the authorization is terminated  or revoked sooner.       Influenza A by PCR NEGATIVE NEGATIVE Final   Influenza B by PCR NEGATIVE NEGATIVE Final    Comment: (NOTE) The Xpert Xpress SARS-CoV-2/FLU/RSV plus assay is intended as an aid in the diagnosis of influenza from Nasopharyngeal swab specimens and should not be used as a sole basis for treatment. Nasal washings and aspirates are unacceptable for Xpert Xpress SARS-CoV-2/FLU/RSV testing.  Fact Sheet for Patients: BloggerCourse.com  Fact Sheet for Healthcare Providers: SeriousBroker.it  This test is not yet approved or cleared by the [place] FDA and has been authorized for detection and/or diagnosis of SARS-CoV-2 by FDA under an Emergency Use Authorization (EUA). This EUA will remain in effect (meaning this test can be used) for the duration of  the COVID-19 declaration under Section 564(b)(1) of the Act, 21 U.S.C. section 360bbb-3(b)(1), unless the authorization is terminated or revoked.  Performed at [facility], [address], [place], [place] [address]   Culture, blood (Routine x 2)     Status: None (Preliminary result)   Collection Time: 12/02/21  9:00 PM   Specimen: BLOOD  Result Value Ref Range Status   Specimen Description   Final    BLOOD Performed at [facility], [address], [place], [place] [address]  Special Requests   Final    NONE Performed at [facility], [address], [place], [place] [address]    Culture   Final    NO GROWTH 3 DAYS Performed at [facility], [address], [place], [place] [address]    Report Status PENDING  Incomplete     Medications:    amphetamine-dextroamphetamine  20 mg Oral Q breakfast   ARIPiprazole  5 mg Oral Daily   enoxaparin (LOVENOX) injection  50 mg Subcutaneous Q24H   fluticasone  1 spray Each Nare Daily   lamoTRIgine  150 mg Oral BID   nicotine  21 mg Transdermal Daily   pantoprazole  40 mg Oral Daily   pregabalin  150 mg Oral Daily   traZODone  100 mg Oral QHS   venlafaxine XR  150 mg Oral Q breakfast   Continuous Infusions:  cefTRIAXone (ROCEPHIN)  IV 2 g (12/06/21 2034)   clindamycin (CLEOCIN) IV 900 mg (12/07/21 0238)   vancomycin 1,000 mg (12/06/21 2119)      LOS: 4 days   Marinda Elk  [facility]  12/07/2021, 8:17 AM

## 2021-12-08 DIAGNOSIS — R6520 Severe sepsis without septic shock: Secondary | ICD-10-CM

## 2021-12-08 DIAGNOSIS — A419 Sepsis, unspecified organism: Secondary | ICD-10-CM | POA: Insufficient documentation

## 2021-12-08 LAB — CULTURE, BLOOD (ROUTINE X 2): Culture: NO GROWTH

## 2021-12-08 MED ORDER — CLINDAMYCIN HCL 300 MG PO CAPS
300.0000 mg | ORAL_CAPSULE | Freq: Four times a day (QID) | ORAL | Status: DC
  Administered 2021-12-08 – 2021-12-09 (×6): 300 mg via ORAL
  Filled 2021-12-08 (×8): qty 1

## 2021-12-08 MED ORDER — DIPHENHYDRAMINE HCL 25 MG PO CAPS
25.0000 mg | ORAL_CAPSULE | Freq: Once | ORAL | Status: AC
  Administered 2021-12-08: 25 mg via ORAL
  Filled 2021-12-08: qty 1

## 2021-12-08 MED ORDER — CEFAZOLIN SODIUM-DEXTROSE 2-4 GM/100ML-% IV SOLN
2.0000 g | Freq: Three times a day (TID) | INTRAVENOUS | Status: DC
  Administered 2021-12-08 – 2021-12-10 (×5): 2 g via INTRAVENOUS
  Filled 2021-12-08 (×6): qty 100

## 2021-12-08 NOTE — Plan of Care (Signed)

## 2021-12-08 NOTE — Hospital Course (Addendum)
36 year old woman PMH bipolar disorder, asthma presented with worsening redness and pain with swelling of left arm status post Nexplanon implant removal 5/16.  Admitted for cellulitis left upper extremity, met criteria for severe sepsis on admission with leukocytosis, tachycardia and elevated lactate.  Initially treated with ceftriaxone, wound erythema worsened, was started on vancomycin and given its worsening still, started on clindamycin.  Ultrasound negative for abscess.

## 2021-12-08 NOTE — Assessment & Plan Note (Signed)
--  secondary to left arm infection, distal upper and proximal lower --Afebrile, sepsis symptomatology resolved, leukocytosis trending down but arm infection does not appear to be improving.

## 2021-12-08 NOTE — Assessment & Plan Note (Addendum)
--   per patient worse today despite 6 days of ceftriaxone, 2 days of doxycycline, 2 days of vancomycin and 3 days of clindamycin. -- No abscess on 2 separate ultrasounds.  Tender to touch but no fluctuance noted. -- ID consult requested, abx per ID, will hold off on MRI as per ID, thought to be impetigo changes.

## 2021-12-08 NOTE — Assessment & Plan Note (Addendum)
--  treated w/ 6 days ceftriaxone.  Await infectious disease consultation for other empiric antibiotics.

## 2021-12-08 NOTE — Progress Notes (Signed)
  Transition of Care [facility]) Screening Note   Patient Details  Name: Tammy Wilkinson Date of Birth: Oct 31, 1985   [facility] of [facility] CM/SW Contact:    Erin Sons, LCSW Phone Number: 12/08/2021, 2:56 PM    Transition of Care Department [facility]) has reviewed patient and no TOC needs have been identified at this time. We will continue to monitor patient advancement through interdisciplinary progression rounds. If new patient transition needs arise, please place a TOC consult.

## 2021-12-08 NOTE — Consult Note (Signed)
[facility]    Date of Admission:  12/02/2021     Reason for Consult: cellulitis     Referring Provider: goodrich    Abx: 5/21-c clinda 5/24-c cefazolin  5/18-23 vanc 1/18-24 ceftriaxone        Assessment: Cellulitis/sepsis Group a strep throat pcr positive/sorethroat  Patient presented with a week of sorethroat and also progressive lue cellulitis/swelling.  She has been on various combination of antibiotics. While the swelling and leukocytosis had trended down, primary team is concerned for the actual redness remains same with honey crusted lesion vs ulceration of skin  U/s no defineable abscess  I think this is group a strep with impetigo soft tissue infection. I can't quite convince myself to say it is not staph areus as that can cause bullous impetigo changes as well  At this tie we can try to culture the wound from the intact bullae and if it grows staph will commit to that. But if that is negative reasonable to finish long course of treatment for group a strep ssi. In the case this is group a strep infection, at this time the clinda use will  thus provide for further toxin inhibition too as there is concern from primary team it is not improving  She is on too many psychotropic meds for linezolid use   I think we can defer further imaging for now as on exam no discernable focal abscess is felt either  Bcx negative Hiv screen negative    Plan: Continue abx with clindamycin and cefazolin Wound cx from intact bullae on LUE If cx is negative or negative for staph aureus, will finish course with strep GAS coverage and remove clinda if I see sustained improvement If cx is staph (mssa vs mrsa) could finish course with cefazolin or doxy Discussed with primary team    I spent 75 minute reviewing data/chart, and coordinating care and >50% direct face to face time providing counseling/discussing diagnostics/treatment plan with  patient   ------------------------------------------------ Principal Problem:   Cellulitis of left upper extremity Active Problems:   Bipolar disorder (Real)   Strep pharyngitis   Hypokalemia    HPI: Tammy Wilkinson is a 36 y.o. female admitted for sepsis with LUE ssi  Patient was well until a week prior to admission when she had the sorethroat. There was no f/c or malaise or cough. During the same time developed LUE cellulitis change  Due to increased pain/swelling/redness in LUE came for admission  On admission afebrile, but had leukocytosis Started on bsAbx Had strep throat pcr that was positive Blood cx negative  The sorethroat is gone However team concern the redness/cellulitis not better and could be wore He wbc has improved Her swelling has improved. She is not too sure about the redness   Afebrile here Feels well otherwise  An u/s x2 were done and didn't see any abscess; last done 5/22  She has 2 children at home age 28 and 52 and no one is sick  No n/v/diarrhea    Family History  Problem Relation Age of Onset   Hypertension Mother    Anxiety disorder Mother    Depression Mother    Physical abuse Mother    Hypertension Father    Drug abuse Sister    Anxiety disorder Sister    Depression Sister    Bipolar disorder Sister    Sexual abuse Sister    Schizophrenia Sister    Sexual  abuse Sister    ADD / ADHD Sister    Depression Sister    Sexual abuse Sister    Drug abuse Sister    Anxiety disorder Sister    Depression Sister    Drug abuse Sister    Alcohol abuse Brother    Drug abuse Brother    Depression Brother    Anxiety disorder Brother    ADD / ADHD Other    ADD / ADHD Other    Anesthesia problems Neg Hx     Social History   Tobacco Use   Smoking status: Every Day    Packs/day: 0.50    Types: Cigarettes    Last attempt to quit: 08/25/2013    Years since quitting: 8.2   Smokeless tobacco: Never  Vaping Use   Vaping Use: Never used   Substance Use Topics   Alcohol use: Not Currently    Comment: rare   Drug use: Yes    Types: Marijuana    Allergies  Allergen Reactions   Codeine Other (See Comments)    nightmares   Amoxil [Amoxicillin] Other (See Comments)    Childhood reaction  - hallucinations   Penicillins Other (See Comments)    Childhood reaction  - hallucinations   Latex Rash    Review of Systems: ROS All Other ROS was negative, except mentioned above   Past Medical History:  Diagnosis Date   ADHD (attention deficit hyperactivity disorder)    Allergy    Anemia    Anxiety    Asthma    pt states when she was a child   Concussion 12/30/14   Depression    Headache(784.0)    Hx; of migraines   Heartburn    hx:   Pneumonia    Shortness of breath        Scheduled Meds:  amphetamine-dextroamphetamine  20 mg Oral Q breakfast   ARIPiprazole  5 mg Oral Daily   clindamycin  300 mg Oral QID   enoxaparin (LOVENOX) injection  50 mg Subcutaneous Q24H   fluticasone  1 spray Each Nare Daily   lamoTRIgine  150 mg Oral BID   nicotine  21 mg Transdermal Daily   pregabalin  150 mg Oral Daily   traZODone  100 mg Oral QHS   venlafaxine XR  150 mg Oral Q breakfast   Continuous Infusions:   ceFAZolin (ANCEF) IV     PRN Meds:.acetaminophen **OR** acetaminophen, albuterol, ibuprofen, oxyCODONE, senna-docusate, sodium chloride, SUMAtriptan   OBJECTIVE: Blood pressure (!) 143/103, pulse 79, temperature 97.6 F (36.4 C), temperature source Oral, resp. rate 18, height 5\' 4"  (1.626 m), weight 102.2 kg, SpO2 95 %.  Physical Exam  General/constitutional: no distress, pleasant HEENT: Normocephalic, PER, Conj Clear, EOMI, Oropharynx clear Neck supple CV: rrr no mrg Lungs: clear to auscultation, normal respiratory effort Abd: Soft, Nontender Ext/ext: lue swelling and erythema. Per patient erythema stable but since with honey crusted lesion change; the swelling had decreased since admission; mild-moderate  tenderness on palpation Neuro: nonfocal MSK: no peripheral joint swelling/tenderness/warmth; back spines nontender    Lab Results Lab Results  Component Value Date   WBC 11.1 (H) 12/07/2021   HGB 11.0 (L) 12/07/2021   HCT 32.5 (L) 12/07/2021   MCV 96.4 12/07/2021   PLT 301 12/07/2021    Lab Results  Component Value Date   CREATININE 0.76 12/07/2021   BUN <5 (L) 12/07/2021   NA 142 12/07/2021   K 3.3 (L) 12/07/2021   CL 104 12/07/2021  CO2 28 12/07/2021    Lab Results  Component Value Date   ALT 14 12/02/2021   AST 15 12/02/2021   ALKPHOS 58 12/02/2021   BILITOT 0.8 12/02/2021      Microbiology: Recent Results (from the past 240 hour(s))  Culture, blood (Routine x 2)     Status: None   Collection Time: 12/02/21  7:03 PM   Specimen: BLOOD  Result Value Ref Range Status   Specimen Description   Final    BLOOD BLOOD RIGHT HAND Performed at [facility], [address], [place], [place] [address]    Special Requests   Final    BOTTLES DRAWN AEROBIC AND ANAEROBIC Blood Culture adequate volume Performed at [facility], [address], [place], [place] [address]    Culture   Final    NO GROWTH 5 DAYS Performed at [facility], [place] [address] [place], [place] [address]    Report Status 12/07/2021 FINAL  Final  Group A Strep by PCR     Status: Abnormal   Collection Time: 12/02/21  7:33 PM   Specimen: Throat; Sterile Swab  Result Value Ref Range Status   Group A Strep by PCR DETECTED (A) NOT DETECTED Final    Comment: Performed at [facility], [address], [place], [place] [address]  Resp Panel by RT-PCR (Flu A&B, Covid) Nasopharyngeal Swab     Status: None   Collection Time: 12/02/21  7:48 PM   Specimen: Nasopharyngeal Swab; Nasopharyngeal(NP) swabs in vial transport medium  Result Value Ref Range Status   SARS Coronavirus 2 by RT PCR NEGATIVE NEGATIVE Final    Comment: (NOTE) SARS-CoV-2  target nucleic acids are NOT DETECTED.  The SARS-CoV-2 RNA is generally detectable in upper respiratory specimens during the acute phase of infection. The lowest concentration of SARS-CoV-2 viral copies this assay can detect is 138 copies/mL. A negative result does not preclude SARS-Cov-2 infection and should not be used as the sole basis for treatment or other patient management decisions. A negative result may occur with  improper specimen collection/handling, submission of specimen other than nasopharyngeal swab, presence of viral mutation(s) within the areas targeted by this assay, and inadequate number of viral copies(<138 copies/mL). A negative result must be combined with clinical observations, patient history, and epidemiological information. The expected result is Negative.  Fact Sheet for Patients:  EntrepreneurPulse.com.au  Fact Sheet for Healthcare Providers:  IncredibleEmployment.be  This test is no t yet approved or cleared by the [place] FDA and  has been authorized for detection and/or diagnosis of SARS-CoV-2 by FDA under an Emergency Use Authorization (EUA). This EUA will remain  in effect (meaning this test can be used) for the duration of the COVID-19 declaration under Section 564(b)(1) of the Act, 21 U.S.C.section 360bbb-3(b)(1), unless the authorization is terminated  or revoked sooner.       Influenza A by PCR NEGATIVE NEGATIVE Final   Influenza B by PCR NEGATIVE NEGATIVE Final    Comment: (NOTE) The Xpert Xpress SARS-CoV-2/FLU/RSV plus assay is intended as an aid in the diagnosis of influenza from Nasopharyngeal swab specimens and should not be used as a sole basis for treatment. Nasal washings and aspirates are unacceptable for Xpert Xpress SARS-CoV-2/FLU/RSV testing.  Fact Sheet for Patients: EntrepreneurPulse.com.au  Fact Sheet for Healthcare  Providers: IncredibleEmployment.be  This test is not yet approved or cleared by the [place] FDA and has been authorized for detection and/or diagnosis of SARS-CoV-2 by FDA under  an Emergency Use Authorization (EUA). This EUA will remain in effect (meaning this test can be used) for the duration of the COVID-19 declaration under Section 564(b)(1) of the Act, 21 U.S.C. section 360bbb-3(b)(1), unless the authorization is terminated or revoked.  Performed at [facility], [address], [place], [place] [address]   Culture, blood (Routine x 2)     Status: None   Collection Time: 12/02/21  9:00 PM   Specimen: BLOOD  Result Value Ref Range Status   Specimen Description   Final    BLOOD Performed at [facility], [address], [place], [place] [address]    Special Requests   Final    NONE Performed at [facility], [address] [place], [place] [address]    Culture   Final    NO GROWTH 5 DAYS Performed at [facility], [place] [address], [place], [place] [address]    Report Status 12/08/2021 FINAL  Final     Serology:    Imaging: If present, new imagings (plain films, ct scans, and mri) have been personally visualized and interpreted; radiology reports have been reviewed. Decision making incorporated into the Impression / Recommendations.  5/22 u/s soft tissue lue CLINICAL DATA:  Upper extremity abscess. Status post Nexplanon removal 11/30/2021.   EXAM: ULTRASOUND LEFT UPPER EXTREMITY LIMITED   TECHNIQUE: Ultrasound examination of the upper extremity soft tissues was performed in the area of clinical concern.   COMPARISON:  Left upper extremity soft tissue ultrasound 12/02/2021   FINDINGS: In the area of interest of the medial aspect of the left upper arm, there is again subcutaneous edema. No walled-off fluid collection or abscess is visualized.   IMPRESSION: No  significant change from prior. Subcutaneous edema in the area of clinical concern compatible with cellulitis. No drainable fluid collection or abscess is identified.  Jabier Mutton, [facility] [place] [phone] pager    12/08/2021, 4:40 PM

## 2021-12-08 NOTE — Progress Notes (Signed)
  Progress Note   Patient: Tammy Wilkinson:[id number] DOB: 1985-09-03 DOA: 12/02/2021     5 DOS: the patient was seen and examined on 12/08/2021   Brief hospital course: 36 year old woman PMH bipolar disorder, asthma presented with worsening redness and pain with swelling of left arm status post Nexplanon implant removal 5/16.  Admitted for cellulitis left upper extremity, met criteria for severe sepsis on admission with leukocytosis, tachycardia and elevated lactate.  Initially treated with ceftriaxone, wound erythema worsened, was started on vancomycin and given its worsening still, started on clindamycin.  Ultrasound negative for abscess.  Assessment and Plan: * Cellulitis of left upper extremity -- per patient worse today despite 6 days of ceftriaxone, 2 days of doxycycline, 2 days of vancomycin and 3 days of clindamycin. -- No abscess on 2 separate ultrasounds.  Tender to touch but no fluctuance noted. -- ID consult requested, abx per ID, will hold off on MRI as per ID, thought to be impetigo changes.  Severe sepsis (HCC)-resolved as of 12/08/2021 --secondary to left arm infection, distal upper and proximal lower --Afebrile, sepsis symptomatology resolved, leukocytosis trending down but arm infection does not appear to be improving.  Strep pharyngitis --treated w/ 6 days ceftriaxone.  Await infectious disease consultation for other empiric antibiotics.        Subjective:  Left arm is worse today with increased erythema, edema and pain with movement.  Physical Exam: Vitals:   12/07/21 1206 12/07/21 1944 12/08/21 0305 12/08/21 1301  BP: (!) 155/108 140/88 133/81 (!) 143/103  Pulse: 80 (!) 104 77 79  Resp: _0 Temp: 98.4 F (36.9 C) 97.9 F (36.6 C) 98.3 F (36.8 C) 97.6 F (36.4 C)  TempSrc: Oral Oral Oral Oral  SpO2: 98% 94% 90% 95%  Weight:      Height:       Physical Exam Vitals reviewed.  Constitutional:      General: She is not in acute distress.     Appearance: She is not ill-appearing or toxic-appearing.  Cardiovascular:     Rate and Rhythm: Normal rate and regular rhythm.     Heart sounds: No murmur heard. Pulmonary:     Effort: Pulmonary effort is normal. No respiratory distress.     Breath sounds: No wheezing, rhonchi or rales.  Skin:    Comments: Left arm hand and wrist appear unremarkable.  There is swelling and erythema of the distal upper arm and proximal forearm with erythema and induration on the volar aspect in particular with warmth and tenderness to touch.  No fluctuance noted.  Neurological:     Mental Status: She is alert.  Psychiatric:        Mood and Affect: Mood normal.        Behavior: Behavior normal.    Data Reviewed:  No new labs  Family Communication: none present or requested  Disposition: Status is: Inpatient Remains inpatient appropriate because: worsening cellulitis  Planned Discharge Destination: Home    Time spent: 35 minutes  Author: Murray Hodgkins, MD 12/08/2021 3:53 PM  For on call review [email or web address].

## 2021-12-09 ENCOUNTER — Inpatient Hospital Stay (HOSPITAL_COMMUNITY): Payer: 59

## 2021-12-09 ENCOUNTER — Encounter: Payer: Self-pay | Admitting: Nurse Practitioner

## 2021-12-09 LAB — MRSA NEXT GEN BY PCR, NASAL: MRSA by PCR Next Gen: NOT DETECTED

## 2021-12-09 MED ORDER — ALPRAZOLAM 0.5 MG PO TABS
0.5000 mg | ORAL_TABLET | Freq: Once | ORAL | Status: AC
Start: 2021-12-09 — End: 2021-12-09
  Administered 2021-12-09: 0.5 mg via ORAL
  Filled 2021-12-09: qty 1

## 2021-12-09 NOTE — Progress Notes (Signed)
[facility]  Date of Admission:  12/02/2021     Abx: 5/21-c clinda 5/24-c cefazolin   5/18-23 vanc 1/18-24 ceftriaxone                                                        Assessment: Cellulitis/sepsis Group a strep throat pcr positive/sorethroat   Patient presented with a week of sorethroat and also progressive lue cellulitis/swelling.  She has been on various combination of antibiotics. While the swelling and leukocytosis had trended down, primary team is concerned for the actual redness remains same with honey crusted lesion vs ulceration of skin   U/s no defineable abscess   I think this is group a strep with impetigo soft tissue infection. I can't quite convince myself to say it is not staph areus as that can cause bullous impetigo changes as well   At this tie we can try to culture the wound from the intact bullae and if it grows staph will commit to that. But if that is negative reasonable to finish long course of treatment for group a strep ssi. In the case this is group a strep infection, at this time the clinda use will  thus provide for further toxin inhibition too as there is concern from primary team it is not improving   She is on too many psychotropic meds for linezolid use    I think we can defer further imaging for now as on exam no discernable focal abscess is felt either   Bcx negative Hiv screen negative  --------------- 5/25 assessment I do feel the cellulitic component is improve since my first assessment of her yesterday. There is some question of olecranon bursitis -- will get mri to clarify this as that can remain a nidus for infection  5/24 impetigo lesion cx is in process  We can maintain the po clinda/iv cefazolin for now pending final culture and improvement in the next 1-2 days       Plan: Continue current clinda cefazolin Mri left elbow -- xanax 0.5 mg one dose order written F/u wound cx from  5/24 If cx is negative or negative for staph aureus, will finish course with strep GAS coverage and remove clinda if I see sustained improvement If cx is staph (mssa vs mrsa) could finish course with cefazolin or doxy Discussed with primary team   I spent more than 50 minute reviewing data/chart, and coordinating care and >50% direct face to face time providing counseling/discussing diagnostics/treatment plan with patient     Principal Problem:   Cellulitis of left upper extremity Active Problems:   Bipolar disorder (HCC)   Strep pharyngitis   Hypokalemia   Sepsis (HCC)   Allergies  Allergen Reactions   Codeine Other (See Comments)    nightmares   Amoxil [Amoxicillin] Other (See Comments)    Childhood reaction  - hallucinations   Penicillins Other (See Comments)    Childhood reaction  - hallucinations   Latex Rash    Scheduled Meds:  amphetamine-dextroamphetamine  20 mg Oral Q breakfast   ARIPiprazole  5 mg Oral Daily   clindamycin  300 mg Oral QID   enoxaparin (LOVENOX) injection  50 mg Subcutaneous Q24H   fluticasone  1 spray Each Nare Daily   lamoTRIgine  150 mg Oral BID   nicotine  21 mg Transdermal Daily   pregabalin  150 mg Oral Daily   traZODone  100 mg Oral QHS   venlafaxine XR  150 mg Oral Q breakfast   Continuous Infusions:   ceFAZolin (ANCEF) IV 2 g (12/09/21 0938)   PRN Meds:.acetaminophen **OR** acetaminophen, albuterol, ibuprofen, oxyCODONE, senna-docusate, sodium chloride, SUMAtriptan   SUBJECTIVE: Patient complains pain around the impetigo area and "deep in the elbow" I reviewed several pictures of her evolution cellulitis that she took. At one point there seems to be a bullae/blister over the olecranon area  No f/c No n/v/diarrhea    Review of Systems: ROS All other ROS was negative, except mentioned above     OBJECTIVE: Vitals:   12/08/21 0305 12/08/21 1301 12/08/21 2126 12/09/21 0513  BP: 133/81 (!) 143/103 138/87 139/89  Pulse:  77 79 74 72  Resp: 18 18 16 18   Temp: 98.3 F (36.8 C) 97.6 F (36.4 C) 98.3 F (36.8 C) 97.7 F (36.5 C)  TempSrc: Oral Oral Oral Oral  SpO2: 90% 95% 95% 95%  Weight:      Height:       Body mass index is 38.67 kg/m.  Physical Exam  General/constitutional: no distress, pleasant HEENT: Normocephalic, PER, Conj Clear, EOMI, Oropharynx clear Neck supple CV: rrr no mrg Lungs: clear to auscultation, normal respiratory effort Abd: Soft, Nontender Ext: no edema Msk/Skin: multiple tatoos; left olecranon tender but no swelling; left forearm swelling improved; erythema and impetigo change medially appears to be better today 5/25 Neuro: nonfocal    Lab Results Lab Results  Component Value Date   WBC 11.1 (H) 12/07/2021   HGB 11.0 (L) 12/07/2021   HCT 32.5 (L) 12/07/2021   MCV 96.4 12/07/2021   PLT 301 12/07/2021    Lab Results  Component Value Date   CREATININE 0.76 12/07/2021   BUN <5 (L) 12/07/2021   NA 142 12/07/2021   K 3.3 (L) 12/07/2021   CL 104 12/07/2021   CO2 28 12/07/2021    Lab Results  Component Value Date   ALT 14 12/02/2021   AST 15 12/02/2021   ALKPHOS 58 12/02/2021   BILITOT 0.8 12/02/2021      Microbiology: Recent Results (from the past 240 hour(s))  Culture, blood (Routine x 2)     Status: None   Collection Time: 12/02/21  7:03 PM   Specimen: BLOOD  Result Value Ref Range Status   Specimen Description   Final    BLOOD BLOOD RIGHT HAND Performed at [facility], [address] [place], [place]    Special Requests   Final    BOTTLES DRAWN AEROBIC AND ANAEROBIC Blood Culture adequate volume Performed at [facility], [address] [place], [place]    Culture   Final    NO GROWTH 5 DAYS Performed at [facility], [address] [place], [place]    Report Status 12/07/2021 FINAL  Final  Group A Strep by PCR     Status: Abnormal   Collection Time: 12/02/21   7:33 PM   Specimen: Throat; Sterile Swab  Result Value Ref Range Status   Group A Strep by PCR DETECTED (A) NOT DETECTED Final    Comment: Performed at [facility], [address] [place], [place]  Resp Panel by RT-PCR (Flu A&B, Covid) Nasopharyngeal Swab     Status: None   Collection Time: 12/02/21  7:48  PM   Specimen: Nasopharyngeal Swab; Nasopharyngeal(NP) swabs in vial transport medium  Result Value Ref Range Status   SARS Coronavirus 2 by RT PCR NEGATIVE NEGATIVE Final    Comment: (NOTE) SARS-CoV-2 target nucleic acids are NOT DETECTED.  The SARS-CoV-2 RNA is generally detectable in upper respiratory specimens during the acute phase of infection. The lowest concentration of SARS-CoV-2 viral copies this assay can detect is 138 copies/mL. A negative result does not preclude SARS-Cov-2 infection and should not be used as the sole basis for treatment or other patient management decisions. A negative result may occur with  improper specimen collection/handling, submission of specimen other than nasopharyngeal swab, presence of viral mutation(s) within the areas targeted by this assay, and inadequate number of viral copies(<138 copies/mL). A negative result must be combined with clinical observations, patient history, and epidemiological information. The expected result is Negative.  Fact Sheet for Patients:  BloggerCourse.comhttps://www.fda.gov/media/152166/download  Fact Sheet for Healthcare Providers:  SeriousBroker.ithttps://www.fda.gov/media/152162/download  This test is no t yet approved or cleared by the [place] FDA and  has been authorized for detection and/or diagnosis of SARS-CoV-2 by FDA under an Emergency Use Authorization (EUA). This EUA will remain  in effect (meaning this test can be used) for the duration of the COVID-19 declaration under Section 564(b)(1) of the Act, 21 U.S.C.section 360bbb-3(b)(1), unless the authorization is terminated  or revoked sooner.        Influenza A by PCR NEGATIVE NEGATIVE Final   Influenza B by PCR NEGATIVE NEGATIVE Final    Comment: (NOTE) The Xpert Xpress SARS-CoV-2/FLU/RSV plus assay is intended as an aid in the diagnosis of influenza from Nasopharyngeal swab specimens and should not be used as a sole basis for treatment. Nasal washings and aspirates are unacceptable for Xpert Xpress SARS-CoV-2/FLU/RSV testing.  Fact Sheet for Patients: BloggerCourse.comhttps://www.fda.gov/media/152166/download  Fact Sheet for Healthcare Providers: SeriousBroker.ithttps://www.fda.gov/media/152162/download  This test is not yet approved or cleared by the [place] FDA and has been authorized for detection and/or diagnosis of SARS-CoV-2 by FDA under an Emergency Use Authorization (EUA). This EUA will remain in effect (meaning this test can be used) for the duration of the COVID-19 declaration under Section 564(b)(1) of the Act, 21 U.S.C. section 360bbb-3(b)(1), unless the authorization is terminated or revoked.  Performed at [facility], [address], [place], [place] [address]   Culture, blood (Routine x 2)     Status: None   Collection Time: 12/02/21  9:00 PM   Specimen: BLOOD  Result Value Ref Range Status   Specimen Description   Final    BLOOD Performed at [facility], [address], [place], [place] [address]    Special Requests   Final    NONE Performed at [facility], [address], [place], [place] [address]    Culture   Final    NO GROWTH 5 DAYS Performed at [facility], [address] [place], [place] [address]    Report Status 12/08/2021 FINAL  Final  Aerobic Culture w Gram Stain (superficial specimen)     Status: None (Preliminary result)   Collection Time: 12/08/21  4:09 PM   Specimen: Wound  Result Value Ref Range Status   Specimen Description   Final    WOUND Performed at [facility], [address] [place], [place]  [address]    Special Requests   Final    NONE LEFT ARM Performed at [facility], [address] [place], [place]  [address]    Gram Stain NO WBC SEEN NO ORGANISMS SEEN   Final   Culture   Final    NO GROWTH < 12 HOURS Performed at [facility], [address] [place], [place] [address]    Report Status PENDING  Incomplete     Serology:   Imaging: If present, new imagings (plain films, ct scans, and mri) have been personally visualized and interpreted; radiology reports have been reviewed. Decision making incorporated into the Impression / Recommendations.  5/22 u/s left UE No significant change from prior. Subcutaneous edema in the area of clinical concern compatible with cellulitis. No drainable fluid collection or abscess is identified.    Raymondo Band, MD [facility] [phone] pager    12/09/2021, 1:25 PM

## 2021-12-09 NOTE — Progress Notes (Signed)
  Progress Note   Patient: Tammy Wilkinson PVV:[id number] DOB: 11-05-1985 DOA: 12/02/2021     6 DOS: the patient was seen and examined on 12/09/2021   Brief hospital course: 36 year old woman PMH bipolar disorder, asthma presented with worsening redness and pain with swelling of left arm status post Nexplanon implant removal 5/16.  Admitted for cellulitis left upper extremity, met criteria for severe sepsis on admission with leukocytosis, tachycardia and elevated lactate.  Initially treated with ceftriaxone, wound erythema worsened, was started on vancomycin and given its worsening still, started on clindamycin.  Ultrasound and CT negative for abscess.  Given worsening, seen by infectious disease.  Antibiotics adjusted.  MRI pending.  Assessment and Plan: * Cellulitis of left upper extremity -- No improvement on examination.  Continue antibiotics per infectious disease.  Plan for MRI today to exclude abscess.  Sepsis (HCC)-resolved as of 12/09/2021 --secondary to left arm infection, distal upper and proximal lower --Afebrile, sepsis symptomatology resolved, leukocytosis trending down but arm infection does not appear to be improving.  Strep pharyngitis -- Continue antibiotics        Subjective:  Arm hurts more  Physical Exam: Vitals:   12/08/21 1301 12/08/21 2126 12/09/21 0513 12/09/21 1347  BP: (!) 143/103 138/87 139/89 (!) 162/107  Pulse: 79 74 72 89  Resp: $Remo'18 16 18 17  'etIPb$ Temp: 97.6 F (36.4 C) 98.3 F (36.8 C) 97.7 F (36.5 C) 98.1 F (36.7 C)  TempSrc: Oral Oral Oral Oral  SpO2: 95% 95% 95% 98%  Weight:      Height:       Physical Exam Vitals reviewed.  Constitutional:      General: She is not in acute distress.    Appearance: She is not ill-appearing or toxic-appearing.  Cardiovascular:     Rate and Rhythm: Normal rate and regular rhythm.     Heart sounds: No murmur heard. Pulmonary:     Effort: Pulmonary effort is normal. No respiratory distress.     Breath  sounds: No wheezing, rhonchi or rales.  Neurological:     Mental Status: She is alert.  Psychiatric:        Mood and Affect: Mood normal.        Behavior: Behavior normal.    Data Reviewed:  There are no new results to review at this time.  Family Communication: none  Disposition: Status is: Inpatient Remains inpatient appropriate because: slow to improve arm infection  Planned Discharge Destination: Home    Time spent: 20 minutes  Author: Murray Hodgkins, MD 12/09/2021 2:42 PM  For on call review [email or web address].

## 2021-12-10 DIAGNOSIS — J020 Streptococcal pharyngitis: Secondary | ICD-10-CM | POA: Diagnosis not present

## 2021-12-10 DIAGNOSIS — L03114 Cellulitis of left upper limb: Secondary | ICD-10-CM | POA: Diagnosis not present

## 2021-12-10 DIAGNOSIS — A419 Sepsis, unspecified organism: Secondary | ICD-10-CM | POA: Diagnosis not present

## 2021-12-10 DIAGNOSIS — R6520 Severe sepsis without septic shock: Secondary | ICD-10-CM

## 2021-12-10 LAB — CREATININE, SERUM
Creatinine, Ser: 0.93 mg/dL (ref 0.44–1.00)
GFR, Estimated: >60 mL/min (ref >60)

## 2021-12-10 MED ORDER — OXYCODONE HCL 15 MG PO TABS: 15.0000 mg | ORAL_TABLET | Freq: Four times a day (QID) | ORAL | 0 refills | Status: DC | PRN

## 2021-12-10 MED ORDER — GADOBUTROL 1 MMOL/ML IV SOLN
10.0000 mL | Freq: Once | INTRAVENOUS | Status: AC | PRN
  Administered 2021-12-10: 10 mL via INTRAVENOUS

## 2021-12-10 MED ORDER — CEFADROXIL 500 MG PO CAPS
1000.0000 mg | ORAL_CAPSULE | Freq: Two times a day (BID) | ORAL | Status: DC
  Administered 2021-12-10: 1000 mg via ORAL
  Filled 2021-12-10 (×2): qty 2

## 2021-12-10 MED ORDER — CEFADROXIL 500 MG PO CAPS
1000.0000 mg | ORAL_CAPSULE | Freq: Two times a day (BID) | ORAL | 0 refills | Status: DC
Start: 2021-12-10 — End: 2021-12-21

## 2021-12-10 NOTE — Discharge Summary (Signed)
Physician Discharge Summary   Patient: Tammy Wilkinson MRN: [id number] DOB: 05-29-1986  Admit date:     12/02/2021  Discharge date: 12/10/21  Discharge Physician: Murray Hodgkins   PCP: Olen Cordial, PA   Recommendations at discharge:   Resolution of LUE cellulitis  Discharge Diagnoses: Principal Problem:   Cellulitis of left upper extremity Active Problems:   Strep pharyngitis   Bipolar disorder [facility])  Resolved Problems:   Severe sepsis [facility])   Hypokalemia  Hospital Course: 36 year old woman PMH bipolar disorder, asthma presented with worsening redness and pain with swelling of left arm status post Nexplanon implant removal 5/16.  Admitted for cellulitis left upper extremity, met criteria for severe sepsis on admission with leukocytosis, tachycardia and elevated lactate.  Initially treated with ceftriaxone, wound erythema worsened, was started on vancomycin and given its worsening still, started on clindamycin.  Ultrasound and CT negative for abscess.  Given worsening, seen by infectious disease.  Antibiotics adjusted.  MRI no abscess.  Condition gradually improving with infectious disease assistance.  Cleared for discharge by infectious disease, has outpatient follow-up arranged, continue antibiotics.  * Cellulitis of left upper extremity -- improved today; will continue antibiotics per infectious disease on discharge. MRI no evidence of abscess.  Sever sepsis (HCC)-resolved as of 12/09/2021 --secondary to left arm infection, distal upper and proximal lower --Afebrile, sepsis symptomatology resolved  Strep pharyngitis -- Complete antibiotics     Pain control - [place] Controlled Substance Reporting System database was reviewed.   Consultants:  ID Procedures performed: None   Disposition: Home Diet recommendation:  Discharge Diet Orders (From admission, onward)     Start     Ordered   12/10/21 0000  Diet - low sodium heart healthy        12/10/21 1317            Regular diet DISCHARGE MEDICATION: Allergies as of 12/10/2021       Reactions   Codeine Other (See Comments)   nightmares   Amoxil [amoxicillin] Other (See Comments)   Childhood reaction  - hallucinations   Penicillins Other (See Comments)   Childhood reaction  - hallucinations   Latex Rash        Medication List     STOP taking these medications    naltrexone 50 MG tablet Commonly known as: DEPADE       TAKE these medications    acetaminophen 500 MG tablet Commonly known as: TYLENOL Take 1,000-1,500 mg by mouth 2 (two) times daily as needed (pain).   albuterol 108 (90 Base) MCG/ACT inhaler Commonly known as: VENTOLIN HFA Inhale 2 puffs into the lungs every 6 (six) hours as needed for wheezing or shortness of breath.   amphetamine-dextroamphetamine 20 MG tablet Commonly known as: ADDERALL Take 20 mg by mouth every morning. What changed: Another medication with the same name was removed. Continue taking this medication, and follow the directions you see here.   ARIPiprazole 5 MG tablet Commonly known as: ABILIFY Take 1 tablet (5 mg total) by mouth daily. What changed: when to take this   cefadroxil 500 MG capsule Commonly known as: DURICEF Take 2 capsules (1,000 mg total) by mouth 2 (two) times daily.   fluticasone 50 MCG/ACT nasal spray Commonly known as: FLONASE Place 1 spray into both nostrils daily.   ibuprofen 200 MG tablet Commonly known as: ADVIL Take 600-800 mg by mouth 2 (two) times daily as needed (pain). What changed: Another medication with the same name was removed. Continue taking  this medication, and follow the directions you see here.   lamoTRIgine 150 MG tablet Commonly known as: LAMICTAL Take 1 tablet (150 mg total) by mouth 2 (two) times daily.   levonorgestrel 20 MCG/DAY Iud Commonly known as: MIRENA 1 each by Intrauterine route once. Implanted 11/30/21   oxyCODONE 15 MG immediate release tablet Commonly known as:  ROXICODONE Take 1 tablet (15 mg total) by mouth every 6 (six) hours as needed for moderate pain or severe pain.   pregabalin 150 MG capsule Commonly known as: LYRICA Take 1 capsule (150 mg total) by mouth daily. What changed: when to take this   SUMAtriptan 20 MG/ACT nasal spray Commonly known as: IMITREX Place 20 mg into the nose See admin instructions. Instill one spray (20 mg) nasally at onset of migraine headache, may repeat in 2 hours if still needed   traZODone 100 MG tablet Commonly known as: DESYREL TAKE 1 TABLET(100 MG) BY MOUTH AT BEDTIME What changed:  how much to take how to take this when to take this additional instructions   venlafaxine XR 150 MG 24 hr capsule Commonly known as: EFFEXOR-XR TAKE 1 CAPSULE(150 MG) BY MOUTH DAILY WITH BREAKFAST What changed:  how much to take when to take this additional instructions   VITAMIN B-12 PO Take 1 tablet by mouth every morning.               Discharge Care Instructions  (From admission, onward)           Start     Ordered   12/10/21 0000  Discharge wound care:       Comments: Keep arm clean and dry.   12/10/21 1317            Follow-up Information     Jabier Mutton, MD Follow up on 12/21/2021.   Specialty: Infectious Diseases Why: 6/06 @ 230pm Contact information: [place] [address] [phone]                Still has left arm pain; somewhat better but has difficulty extending and flexing arm.  Discharge Exam: Filed Weights   12/02/21 1845 12/03/21 0102  Weight: 97 kg 102.2 kg   Physical Exam Vitals reviewed.  Constitutional:      General: She is not in acute distress.    Appearance: She is not ill-appearing or toxic-appearing.  Cardiovascular:     Rate and Rhythm: Normal rate and regular rhythm.     Heart sounds: No murmur heard. Pulmonary:     Effort: Pulmonary effort is normal. No respiratory distress.     Breath sounds: No wheezing,  rhonchi or rales.  Skin:    Comments: Erythema left arm decreasing; dry; still tender to touch over upper forearm volar aspect and over olecranon. Radial pulse normal, perfusion to hand appears normal.  Neurological:     Mental Status: She is alert.  Psychiatric:        Mood and Affect: Mood normal.        Behavior: Behavior normal.     Condition at discharge: good  The results of significant diagnostics from this hospitalization (including imaging, microbiology, ancillary and laboratory) are listed below for reference.   Imaging Studies: DG Chest 2 View  Result Date: 12/02/2021 CLINICAL DATA:  Suspected sepsis. EXAM: CHEST - 2 VIEW COMPARISON:  Chest x-ray 03/29/2013 FINDINGS: The heart size and mediastinal contours are within normal limits. Both lungs are clear. The visualized skeletal structures are  unremarkable. IMPRESSION: No active cardiopulmonary disease. Electronically Signed   By: Ronney Asters M.D.   On: 12/02/2021 19:31   MR ELBOW LEFT W WO CONTRAST  Result Date: 12/10/2021 CLINICAL DATA:  Left elbow cellulitis. EXAM: MRI OF THE LEFT ELBOW WITHOUT AND WITH CONTRAST TECHNIQUE: Multiplanar, multisequence MR imaging of the elbow was performed before and after the administration of intravenous contrast. CONTRAST:  71m GADAVIST GADOBUTROL 1 MMOL/ML IV SOLN COMPARISON:  Ultrasound 12/06/2021 FINDINGS: Today's exam was performed to assess for infection and accordingly used a general protocol rather than the typical orthopedic protocol. Osseous structures and joint: No findings of osteomyelitis or fracture. No joint effusion. Musculotendinous: No myositis or abscess identified. Other: Subcutaneous edema and subcutaneous enhancement anteriorly, medially, and posteriorly in the upper arm extending into the elbow region. At and distal to the elbow, the subcutaneous edema and enhancement is most confluent medially, laterally, and posteriorly. No abscess or drainable collection. No gas is  identified within the soft tissues. No overt bursitis. IMPRESSION: 1. Subcutaneous edema and enhancement in the distal upper arm, elbow, and proximal forearm favoring cellulitis given the clinical scenario. No drainable fluid collection, joint effusion, or osteomyelitis identified. Electronically Signed   By: WVan ClinesM.D.   On: 12/10/2021 08:47   [place] UPPER EXTREM LTD SOFT TISSUE NON VASCULAR  Result Date: 12/06/2021 CLINICAL DATA:  Upper extremity abscess. Status post Nexplanon removal 11/30/2021. EXAM: ULTRASOUND LEFT UPPER EXTREMITY LIMITED TECHNIQUE: Ultrasound examination of the upper extremity soft tissues was performed in the area of clinical concern. COMPARISON:  Left upper extremity soft tissue ultrasound 12/02/2021 FINDINGS: In the area of interest of the medial aspect of the left upper arm, there is again subcutaneous edema. No walled-off fluid collection or abscess is visualized. IMPRESSION: No significant change from prior. Subcutaneous edema in the area of clinical concern compatible with cellulitis. No drainable fluid collection or abscess is identified. Electronically Signed   By: RYvonne KendallM.D.   On: 12/06/2021 09:36   [place] UPPER EXTREM LTD SOFT TISSUE NON VASCULAR  Result Date: 12/02/2021 CLINICAL DATA:  Left upper arm swelling/redness x2 days, status post Nexplanon removal EXAM: ULTRASOUND LEFT UPPER EXTREMITY LIMITED TECHNIQUE: Ultrasound examination of the upper extremity soft tissues was performed in the area of clinical concern. COMPARISON:  None Available. FINDINGS: Soft tissue ultrasound was performed in the area of clinical concern (left upper extremity). Subcutaneous edema is present. No drainable fluid collection/abscess. IMPRESSION: Subcutaneous edema in the area of clinical concern, compatible with the clinical history of cellulitis. No drainable fluid collection/abscess. Electronically Signed   By: SJulian HyM.D.   On: 12/02/2021 21:01     Microbiology: Results for orders placed or performed during the hospital encounter of 12/02/21  Culture, blood (Routine x 2)     Status: None   Collection Time: 12/02/21  7:03 PM   Specimen: BLOOD  Result Value Ref Range Status   Specimen Description   Final    BLOOD BLOOD RIGHT HAND Performed at [facility], [address] [place] [address]   Special Requests   Final    BOTTLES DRAWN AEROBIC AND ANAEROBIC Blood Culture adequate volume Performed at [facility], [address] [place] [address]   Culture   Final    NO GROWTH 5 DAYS Performed at [facility] [address] [place] [address]   Report Status 12/07/2021 FINAL  Final  Group A Strep by  PCR     Status: Abnormal   Collection Time: 12/02/21  7:33 PM   Specimen: Throat; Sterile Swab  Result Value Ref Range Status   Group A Strep by PCR DETECTED (A) NOT DETECTED Final    Comment: Performed at [facility], [address] [place], [place] [address]  Resp Panel by RT-PCR (Flu A&B, Covid) Nasopharyngeal Swab     Status: None   Collection Time: 12/02/21  7:48 PM   Specimen: Nasopharyngeal Swab; Nasopharyngeal(NP) swabs in vial transport medium  Result Value Ref Range Status   SARS Coronavirus 2 by RT PCR NEGATIVE NEGATIVE Final    Comment: (NOTE) SARS-CoV-2 target nucleic acids are NOT DETECTED.  The SARS-CoV-2 RNA is generally detectable in upper respiratory specimens during the acute phase of infection. The lowest concentration of SARS-CoV-2 viral copies this assay can detect is 138 copies/mL. A negative result does not preclude SARS-Cov-2 infection and should not be used as the sole basis for treatment or other patient management decisions. A negative result may occur with  improper specimen collection/handling, submission of specimen other than nasopharyngeal swab, presence of viral mutation(s) within the areas  targeted by this assay, and inadequate number of viral copies(<138 copies/mL). A negative result must be combined with clinical observations, patient history, and epidemiological information. The expected result is Negative.  Fact Sheet for Patients:  EntrepreneurPulse.com.au  Fact Sheet for Healthcare Providers:  IncredibleEmployment.be  This test is no t yet approved or cleared by the [place] FDA and  has been authorized for detection and/or diagnosis of SARS-CoV-2 by FDA under an Emergency Use Authorization (EUA). This EUA will remain  in effect (meaning this test can be used) for the duration of the COVID-19 declaration under Section 564(b)(1) of the Act, 21 U.S.C.section 360bbb-3(b)(1), unless the authorization is terminated  or revoked sooner.       Influenza A by PCR NEGATIVE NEGATIVE Final   Influenza B by PCR NEGATIVE NEGATIVE Final    Comment: (NOTE) The Xpert Xpress SARS-CoV-2/FLU/RSV plus assay is intended as an aid in the diagnosis of influenza from Nasopharyngeal swab specimens and should not be used as a sole basis for treatment. Nasal washings and aspirates are unacceptable for Xpert Xpress SARS-CoV-2/FLU/RSV testing.  Fact Sheet for Patients: EntrepreneurPulse.com.au  Fact Sheet for Healthcare Providers: IncredibleEmployment.be  This test is not yet approved or cleared by the [place] FDA and has been authorized for detection and/or diagnosis of SARS-CoV-2 by FDA under an Emergency Use Authorization (EUA). This EUA will remain in effect (meaning this test can be used) for the duration of the COVID-19 declaration under Section 564(b)(1) of the Act, 21 U.S.C. section 360bbb-3(b)(1), unless the authorization is terminated or revoked.  Performed at [facility], [address] [place], [place] [address]   Culture, blood (Routine x 2)     Status: None    Collection Time: 12/02/21  9:00 PM   Specimen: BLOOD  Result Value Ref Range Status   Specimen Description   Final    BLOOD Performed at [facility], [address] [place], [place] [address]    Special Requests   Final    NONE Performed at [facility], [address], [place], [place] [address]    Culture   Final    NO GROWTH 5 DAYS Performed at [facility], [place] [address], [place],  [address]    Report Status 12/08/2021 FINAL  Final  Aerobic Culture w Gram Stain (  superficial specimen)     Status: None (Preliminary result)   Collection Time: 12/08/21  4:09 PM   Specimen: Wound  Result Value Ref Range Status   Specimen Description   Final    WOUND Performed at [facility] [address], [place], [place] [address]    Special Requests   Final    NONE LEFT ARM Performed at [facility] [address] [place], [place] [address]    Gram Stain NO WBC SEEN NO ORGANISMS SEEN   Final   Culture   Final    NO GROWTH 2 DAYS Performed at [facility], [place] [address] [place], [place] [address]    Report Status PENDING  Incomplete  MRSA Next Gen by PCR, Nasal     Status: None   Collection Time: 12/09/21  1:00 PM   Specimen: Nasal Mucosa; Nasal Swab  Result Value Ref Range Status   MRSA by PCR Next Gen NOT DETECTED NOT DETECTED Final    Comment: (NOTE) The GeneXpert MRSA Assay (FDA approved for NASAL specimens only), is one component of a comprehensive MRSA colonization surveillance program. It is not intended to diagnose MRSA infection nor to guide or monitor treatment for MRSA infections. Test performance is not FDA approved in patients less than 29 years old. Performed at [facility], [place] [address], [place], [place] [address]     Labs: CBC: Recent Labs  Lab 12/04/21 0601 12/05/21 0459 12/07/21 0746  WBC 14.7* 13.9* 11.1*  HGB 11.4* 10.5* 11.0*   HCT 34.2* 33.3* 32.5*  MCV 100.0 104.4* 96.4  PLT 186 212 440   Basic Metabolic Panel: Recent Labs  Lab 12/04/21 0601 12/05/21 0459 12/07/21 0746 12/10/21 0636  NA 138 136 142  --   K 3.9 4.2 3.3*  --   CL 109 106 104  --   CO2 23 21* 28  --   GLUCOSE 123* 118* 93  --   BUN 10 10 <5*  --   CREATININE 0.75 0.83 0.76 0.93  CALCIUM 8.2* 8.4* 8.8*  --   MG 1.9  --   --   --    Liver Function Tests: No results for input(s): AST, ALT, ALKPHOS, BILITOT, PROT, ALBUMIN in the last 168 hours. CBG: No results for input(s): GLUCAP in the last 168 hours.  Discharge time spent: less than 30 minutes.  Signed: Murray Hodgkins, MD [facility] 12/10/2021

## 2021-12-10 NOTE — Progress Notes (Signed)
[facility]  Date of Admission:  12/02/2021     Abx: 5/21-c clinda 5/24-c cefazolin   5/18-23 vanc 1/18-24 ceftriaxone                                                        Assessment: Cellulitis/sepsis Group a strep throat pcr positive/sorethroat   Patient presented with a week of sorethroat and also progressive lue cellulitis/swelling.  She has been on various combination of antibiotics. While the swelling and leukocytosis had trended down, primary team is concerned for the actual redness remains same with honey crusted lesion vs ulceration of skin   U/s no defineable abscess   I think this is group a strep with impetigo soft tissue infection. I can't quite convince myself to say it is not staph areus as that can cause bullous impetigo changes as well   At this tie we can try to culture the wound from the intact bullae and if it grows staph will commit to that. But if that is negative reasonable to finish long course of treatment for group a strep ssi. In the case this is group a strep infection, at this time the clinda use will  thus provide for further toxin inhibition too as there is concern from primary team it is not improving   She is on too many psychotropic meds for linezolid use    I think we can defer further imaging for now as on exam no discernable focal abscess is felt either   Bcx negative Hiv screen negative  --------------- 5/26 assessment Improved redness; no further new impetigo/blistering process Pain appears to be improving Mri left elbow 5/25 without deeper soft tissue or osseous involvement  Mrsa nares cx negative. Low suspicion for mrsa Would feel comfortable transitioning to oral cefadroxil today and stop clindamycin  5/24 blister swab cx ngtd    Plan: Stop clindamycin and cefazolin Start cefadroxil 1 gram po bid; plan another 10 days of antibiotics until 6/06 She has ID clinic follow up with me on 6/06 @  230pm Would be ok to discharge from id standpoint Discussed with primary team   I spent more than 35 minute reviewing data/chart, and coordinating care and >50% direct face to face time providing counseling/discussing diagnostics/treatment plan with patient     Principal Problem:   Cellulitis of left upper extremity Active Problems:   Bipolar disorder (HCC)   Strep pharyngitis   Allergies  Allergen Reactions   Codeine Other (See Comments)    nightmares   Amoxil [Amoxicillin] Other (See Comments)    Childhood reaction  - hallucinations   Penicillins Other (See Comments)    Childhood reaction  - hallucinations   Latex Rash    Scheduled Meds:  amphetamine-dextroamphetamine  20 mg Oral Q breakfast   ARIPiprazole  5 mg Oral Daily   cefadroxil  1,000 mg Oral BID   enoxaparin (LOVENOX) injection  50 mg Subcutaneous Q24H   fluticasone  1 spray Each Nare Daily   lamoTRIgine  150 mg Oral BID   nicotine  21 mg Transdermal Daily   pregabalin  150 mg Oral Daily   traZODone  100 mg Oral QHS   venlafaxine XR  150 mg Oral Q breakfast   Continuous Infusions:   PRN  Meds:.acetaminophen **OR** acetaminophen, albuterol, ibuprofen, oxyCODONE, senna-docusate, sodium chloride, SUMAtriptan   SUBJECTIVE: Mri reviewed with patient No fever, chill No n/v/diarrhea Improved redness/swelling Not as painful    Review of Systems: ROS All other ROS was negative, except mentioned above     OBJECTIVE: Vitals:   12/09/21 0513 12/09/21 1347 12/09/21 2152 12/10/21 0542  BP: 139/89 (!) 162/107 (!) 167/98 (!) 165/96  Pulse: 72 89 73 (!) 102  Resp: 18 17 18 18   Temp: 97.7 F (36.5 C) 98.1 F (36.7 C) 97.7 F (36.5 C) 98.1 F (36.7 C)  TempSrc: Oral Oral Oral   SpO2: 95% 98% 98% 90%  Weight:      Height:       Body mass index is 38.67 kg/m.  Physical Exam  General/constitutional: no distress, pleasant HEENT: Normocephalic, PER, Conj Clear, EOMI, Oropharynx clear Neck  supple CV: rrr no mrg Lungs: clear to auscultation, normal respiratory effort Abd: Soft, Nontender Ext: no edema MSK/Skin: stable impetigo changes but more ruptured/scabbing; redness significantly improved; minimal warmth and swelling; good rom elbow    Lab Results Lab Results  Component Value Date   WBC 11.1 (H) 12/07/2021   HGB 11.0 (L) 12/07/2021   HCT 32.5 (L) 12/07/2021   MCV 96.4 12/07/2021   PLT 301 12/07/2021    Lab Results  Component Value Date   CREATININE 0.93 12/10/2021   BUN <5 (L) 12/07/2021   NA 142 12/07/2021   K 3.3 (L) 12/07/2021   CL 104 12/07/2021   CO2 28 12/07/2021    Lab Results  Component Value Date   ALT 14 12/02/2021   AST 15 12/02/2021   ALKPHOS 58 12/02/2021   BILITOT 0.8 12/02/2021      Microbiology: Recent Results (from the past 240 hour(s))  Culture, blood (Routine x 2)     Status: None   Collection Time: 12/02/21  7:03 PM   Specimen: BLOOD  Result Value Ref Range Status   Specimen Description   Final    BLOOD BLOOD RIGHT HAND Performed at [facility], [address] [place], [place]    Special Requests   Final    BOTTLES DRAWN AEROBIC AND ANAEROBIC Blood Culture adequate volume Performed at [facility], [address], [place], [place]    Culture   Final    NO GROWTH 5 DAYS Performed at [facility], [address] [place], [place]    Report Status 12/07/2021 FINAL  Final  Group A Strep by PCR     Status: Abnormal   Collection Time: 12/02/21  7:33 PM   Specimen: Throat; Sterile Swab  Result Value Ref Range Status   Group A Strep by PCR DETECTED (A) NOT DETECTED Final    Comment: Performed at [facility], [address] [place], [place]  Resp Panel by RT-PCR (Flu A&B, Covid) Nasopharyngeal Swab     Status: None   Collection Time: 12/02/21  7:48 PM   Specimen: Nasopharyngeal Swab; Nasopharyngeal(NP) swabs in vial  transport medium  Result Value Ref Range Status   SARS Coronavirus 2 by RT PCR NEGATIVE NEGATIVE Final    Comment: (NOTE) SARS-CoV-2 target nucleic acids are NOT DETECTED.  The SARS-CoV-2 RNA is generally detectable in upper respiratory specimens during the acute phase of infection. The lowest concentration of SARS-CoV-2 viral copies this assay can detect is 138 copies/mL. A negative result does not preclude SARS-Cov-2 infection and should not be used as the  sole basis for treatment or other patient management decisions. A negative result may occur with  improper specimen collection/handling, submission of specimen other than nasopharyngeal swab, presence of viral mutation(s) within the areas targeted by this assay, and inadequate number of viral copies(<138 copies/mL). A negative result must be combined with clinical observations, patient history, and epidemiological information. The expected result is Negative.  Fact Sheet for Patients:  BloggerCourse.com  Fact Sheet for Healthcare Providers:  SeriousBroker.it  This test is no t yet approved or cleared by the [place] FDA and  has been authorized for detection and/or diagnosis of SARS-CoV-2 by FDA under an Emergency Use Authorization (EUA). This EUA will remain  in effect (meaning this test can be used) for the duration of the COVID-19 declaration under Section 564(b)(1) of the Act, 21 U.S.C.section 360bbb-3(b)(1), unless the authorization is terminated  or revoked sooner.       Influenza A by PCR NEGATIVE NEGATIVE Final   Influenza B by PCR NEGATIVE NEGATIVE Final    Comment: (NOTE) The Xpert Xpress SARS-CoV-2/FLU/RSV plus assay is intended as an aid in the diagnosis of influenza from Nasopharyngeal swab specimens and should not be used as a sole basis for treatment. Nasal washings and aspirates are unacceptable for Xpert Xpress SARS-CoV-2/FLU/RSV testing.  Fact  Sheet for Patients: BloggerCourse.com  Fact Sheet for Healthcare Providers: SeriousBroker.it  This test is not yet approved or cleared by the [place] FDA and has been authorized for detection and/or diagnosis of SARS-CoV-2 by FDA under an Emergency Use Authorization (EUA). This EUA will remain in effect (meaning this test can be used) for the duration of the COVID-19 declaration under Section 564(b)(1) of the Act, 21 U.S.C. section 360bbb-3(b)(1), unless the authorization is terminated or revoked.  Performed at [facility], [address], [place], [place] [address]   Culture, blood (Routine x 2)     Status: None   Collection Time: 12/02/21  9:00 PM   Specimen: BLOOD  Result Value Ref Range Status   Specimen Description   Final    BLOOD Performed at [facility], [address] [place], [place] [address]    Special Requests   Final    NONE Performed at [facility], [address], [place], [place] [address]    Culture   Final    NO GROWTH 5 DAYS Performed at [facility], [address], [place], [place] [address]    Report Status 12/08/2021 FINAL  Final  Aerobic Culture w Gram Stain (superficial specimen)     Status: None (Preliminary result)   Collection Time: 12/08/21  4:09 PM   Specimen: Wound  Result Value Ref Range Status   Specimen Description   Final    WOUND Performed at [facility], [address], [place], [place] [address]    Special Requests   Final    NONE LEFT ARM Performed at [facility], [address] [place], [place] [address]    Gram Stain NO WBC SEEN NO ORGANISMS SEEN   Final   Culture   Final    NO GROWTH 2 DAYS Performed at [facility], [address], [place], [place] [address]    Report Status PENDING  Incomplete  MRSA Next Gen by PCR, Nasal     Status: None    Collection Time: 12/09/21  1:00 PM   Specimen: Nasal Mucosa; Nasal Swab  Result Value Ref Range Status   MRSA by  PCR Next Gen NOT DETECTED NOT DETECTED Final    Comment: (NOTE) The GeneXpert MRSA Assay (FDA approved for NASAL specimens only), is one component of a comprehensive MRSA colonization surveillance program. It is not intended to diagnose MRSA infection nor to guide or monitor treatment for MRSA infections. Test performance is not FDA approved in patients less than 38 years old. Performed at [facility], [address], [place], [place] [address]      Serology:   Imaging: If present, new imagings (plain films, ct scans, and mri) have been personally visualized and interpreted; radiology reports have been reviewed. Decision making incorporated into the Impression / Recommendations.  5/22 u/s left UE No significant change from prior. Subcutaneous edema in the area of clinical concern compatible with cellulitis. No drainable fluid collection or abscess is identified.  5/26 mri left elbow with contrast 1. Subcutaneous edema and enhancement in the distal upper arm, elbow, and proximal forearm favoring cellulitis given the clinical scenario. No drainable fluid collection, joint effusion, or osteomyelitis identified.  Raymondo Band, MD [facility] [phone] pager    12/10/2021, 11:56 AM

## 2021-12-10 NOTE — Telephone Encounter (Signed)
Call to patient, no answer, mailbox full, unable to leave message.   MyChart message to patient.   Routing to [place].   Encounter closed.

## 2021-12-11 LAB — AEROBIC CULTURE W GRAM STAIN (SUPERFICIAL SPECIMEN)
Culture: NO GROWTH
Gram Stain: NONE SEEN

## 2021-12-21 ENCOUNTER — Encounter: Payer: Self-pay | Admitting: Internal Medicine

## 2021-12-21 ENCOUNTER — Ambulatory Visit (INDEPENDENT_AMBULATORY_CARE_PROVIDER_SITE_OTHER): Payer: 59 | Admitting: Internal Medicine

## 2021-12-21 ENCOUNTER — Other Ambulatory Visit: Payer: Self-pay

## 2021-12-21 VITALS — BP 126/87 | HR 106 | Temp 98.2°F | Wt 223.0 lb

## 2021-12-21 DIAGNOSIS — A400 Sepsis due to streptococcus, group A: Secondary | ICD-10-CM

## 2021-12-21 DIAGNOSIS — L03114 Cellulitis of left upper limb: Secondary | ICD-10-CM

## 2021-12-21 DIAGNOSIS — L0390 Cellulitis, unspecified: Secondary | ICD-10-CM

## 2021-12-21 MED ORDER — CEFADROXIL 500 MG PO CAPS: 500.0000 mg | ORAL_CAPSULE | Freq: Two times a day (BID) | ORAL | 0 refills | Status: AC

## 2021-12-21 NOTE — Progress Notes (Signed)
[facility]  Patient Active Problem List   Diagnosis Date Noted   Strep pharyngitis 12/03/2021   Cellulitis of left upper extremity 12/02/2021   Opioid abuse, episodic use ([place]) 05/15/2019   Cannabis dependence, daily use ([place]) 04/23/2019   Alcohol use disorder, severe, dependence ([place]) 03/12/2019   Bipolar disorder ([place]) 04/27/2014      Subjective:    Patient ID: Tammy Wilkinson, female    DOB: 09-Jul-1986, 36 y.o.   MRN: [id number]  Chief Complaint  Patient presents with   Hospitalization Follow-up    HPI:  Tammy Wilkinson is a 36 y.o. female here for hospital follow up of group a strep skin/soft tissue infection  She was admitted 5/18 with severe LUE ssi. Mri showed no evidence abscess/septic bursitis-arthritis or OM. She had some sorethroat just prior to onset of the cellulitis and had a throat strep cx done that was positive  She had significant impetigo changes and ID evaluated and presumed it was all group a strep process  She was discharged on cefadroxil until 6/06 for basically 18 day of abx  She finished the last dose today but still have some redness and pain there otherwise significantly better  No n/v/diarrhea/f/c     Allergies  Allergen Reactions   Codeine Other (See Comments)    nightmares   Amoxil [Amoxicillin] Other (See Comments)    Childhood reaction  - hallucinations   Penicillins Other (See Comments)    Childhood reaction  - hallucinations   Latex Rash      Outpatient Medications Prior to Visit  Medication Sig Dispense Refill   acetaminophen (TYLENOL) 500 MG tablet Take 1,000-1,500 mg by mouth 2 (two) times daily as needed (pain).     albuterol (VENTOLIN HFA) 108 (90 Base) MCG/ACT inhaler Inhale 2 puffs into the lungs every 6 (six) hours as needed for wheezing or shortness of breath.     amphetamine-dextroamphetamine (ADDERALL) 20 MG tablet Take 20 mg by mouth every morning.     ARIPiprazole  (ABILIFY) 5 MG tablet Take 1 tablet (5 mg total) by mouth daily. (Patient taking differently: Take 5 mg by mouth at bedtime.) 30 tablet 0   cefadroxil (DURICEF) 500 MG capsule Take 2 capsules (1,000 mg total) by mouth 2 (two) times daily. 20 capsule 0   Cyanocobalamin (VITAMIN B-12 PO) Take 1 tablet by mouth every morning.     fluticasone (FLONASE) 50 MCG/ACT nasal spray Place 1 spray into both nostrils daily.     ibuprofen (ADVIL) 200 MG tablet Take 600-800 mg by mouth 2 (two) times daily as needed (pain).     lamoTRIgine (LAMICTAL) 150 MG tablet Take 1 tablet (150 mg total) by mouth 2 (two) times daily. 60 tablet 2   levonorgestrel (MIRENA) 20 MCG/DAY IUD 1 each by Intrauterine route once. Implanted 11/30/21     pregabalin (LYRICA) 150 MG capsule Take 1 capsule (150 mg total) by mouth daily. (Patient taking differently: Take 150 mg by mouth at bedtime.) 30 capsule 1   SUMAtriptan (IMITREX) 20 MG/ACT nasal spray Place 20 mg into the nose See admin instructions. Instill one spray (20 mg) nasally at onset of migraine headache, may repeat in 2 hours if still needed     traZODone (DESYREL) 100 MG tablet TAKE 1 TABLET(100 MG) BY MOUTH AT BEDTIME (Patient taking differently: Take 100 mg by mouth at bedtime.) 30 tablet 2   venlafaxine XR (EFFEXOR-XR) 150 MG 24 hr capsule TAKE 1  CAPSULE(150 MG) BY MOUTH DAILY WITH BREAKFAST (Patient taking differently: 150 mg at bedtime.) 30 capsule 0   oxyCODONE (ROXICODONE) 15 MG immediate release tablet Take 1 tablet (15 mg total) by mouth every 6 (six) hours as needed for moderate pain or severe pain. (Patient not taking: Reported on 12/21/2021) 20 tablet 0   No facility-administered medications prior to visit.     Social History   Socioeconomic History   Marital status: Divorced    Spouse name: Not on file   Number of children: Not on file   Years of education: Not on file   Highest education level: Not on file  Occupational History   Not on file  Tobacco Use    Smoking status: Every Day    Packs/day: 0.50    Types: Cigarettes    Last attempt to quit: 08/25/2013    Years since quitting: 8.3   Smokeless tobacco: Never  Vaping Use   Vaping Use: Never used  Substance and Sexual Activity   Alcohol use: Not Currently    Comment: rare   Drug use: Yes    Types: Marijuana   Sexual activity: Yes    Birth control/protection: Implant  Other Topics Concern   Not on file  Social History Narrative   Not on file   Social Determinants of Health   Financial Resource Strain: Not on file  Food Insecurity: Not on file  Transportation Needs: Not on file  Physical Activity: Not on file  Stress: Not on file  Social Connections: Not on file  Intimate Partner Violence: Not on file      Review of Systems    Other ros negative   Objective:    BP 126/87   Pulse (!) 106   Temp 98.2 F (36.8 C) (Oral)   Wt 223 lb (101.2 kg)   BMI 38.28 kg/m  Nursing note and vital signs reviewed.  Physical Exam     General: no distress, conversant Heent: normocephalic; per; conj clear Cv: rrr no mrg Lungs: normal respiratory effort Abd: s/nt Ext/skin: left upper ext cellulitis much better since 2 weeks ago; there is still tenderness over the elbow area without swelling/fluctuance; there is erythema patch medially that is warm and tender      Labs: Lab Results  Component Value Date   WBC 11.1 (H) 12/07/2021   HGB 11.0 (L) 12/07/2021   HCT 32.5 (L) 12/07/2021   MCV 96.4 12/07/2021   PLT 301 [id number]   Last metabolic panel Lab Results  Component Value Date   GLUCOSE 93 12/07/2021   NA 142 12/07/2021   K 3.3 (L) 12/07/2021   CL 104 12/07/2021   CO2 28 12/07/2021   BUN <5 (L) 12/07/2021   CREATININE 0.93 12/10/2021   GFRNONAA >60 12/10/2021   CALCIUM 8.8 (L) 12/07/2021   PROT 7.1 12/02/2021   ALBUMIN 4.0 12/02/2021   LABGLOB 2.4 07/30/2021   AGRATIO 2.0 07/30/2021   BILITOT 0.8 12/02/2021   ALKPHOS 58 12/02/2021   AST 15 12/02/2021    ALT 14 12/02/2021   ANIONGAP 10 12/07/2021    Micro:  Serology:  Imaging:  Assessment & Plan:   Problem List Items Addressed This Visit   None Visit Diagnoses     Sepsis due to group A Streptococcus, unspecified whether acute organ dysfunction present ([place])    -  Primary   Cellulitis, unspecified cellulitis site           Her cellulitis seems much improved although it is  rather long. Probably just a severe group a strep bacteria in play  Due to persistent sign of inflammation/infection will extend her abx to another 2 weeks. She is to take until redness resolves and 3 days beyond that  If she is at 2 weeks and still symptomatic she'll call us for follow up    Follow-up: Return if symptoms worsen or fail to improve.      Jabier Mutton, [facility] 12/21/2021, 2:44 PM

## 2021-12-21 NOTE — Patient Instructions (Signed)
Continue more cefadroxil   Take until redness resolves and 3 more days beyond. If at 2 weeks you still have redness call us for reevaluation scheduling

## 2022-01-10 ENCOUNTER — Ambulatory Visit: Payer: 59 | Admitting: Nurse Practitioner

## 2022-01-25 ENCOUNTER — Encounter (HOSPITAL_COMMUNITY): Payer: Self-pay | Admitting: Psychiatry

## 2022-01-25 ENCOUNTER — Telehealth (HOSPITAL_BASED_OUTPATIENT_CLINIC_OR_DEPARTMENT_OTHER): Payer: 59 | Admitting: Psychiatry

## 2022-01-25 VITALS — Wt 223.0 lb

## 2022-01-25 DIAGNOSIS — F1021 Alcohol dependence, in remission: Secondary | ICD-10-CM | POA: Diagnosis not present

## 2022-01-25 DIAGNOSIS — F3131 Bipolar disorder, current episode depressed, mild: Secondary | ICD-10-CM

## 2022-01-25 DIAGNOSIS — F419 Anxiety disorder, unspecified: Secondary | ICD-10-CM | POA: Diagnosis not present

## 2022-01-25 MED ORDER — VENLAFAXINE HCL ER 150 MG PO CP24: 150.0000 mg | ORAL_CAPSULE | Freq: Every day | ORAL | 0 refills | Status: AC

## 2022-01-25 MED ORDER — PREGABALIN 150 MG PO CAPS: 150.0000 mg | ORAL_CAPSULE | Freq: Every day | ORAL | 0 refills | Status: AC

## 2022-01-25 MED ORDER — ARIPIPRAZOLE 5 MG PO TABS: 5.0000 mg | ORAL_TABLET | Freq: Every day | ORAL | 0 refills | Status: AC

## 2022-01-25 MED ORDER — LAMOTRIGINE 150 MG PO TABS: 150.0000 mg | ORAL_TABLET | Freq: Two times a day (BID) | ORAL | 0 refills | Status: AC

## 2022-01-25 NOTE — Progress Notes (Signed)
Virtual Visit via Telephone Note  I connected with Tammy Wilkinson on 01/25/22 at 10:40 AM EDT by telephone and verified that I am speaking with the correct person using two identifiers.  Location: Patient: Home Provider: Home Office   I discussed the limitations, risks, security and privacy concerns of performing an evaluation and management service by telephone and the availability of in person appointments. I also discussed with the patient that there may be a patient responsible charge related to this service. The patient expressed understanding and agreed to proceed.   History of Present Illness: Patient is evaluated by phone session.  She reported that her insurance does not cover [facility] appointments and she is now going to see a new doctor but like to have refills.  [facility] [place].  Patient told that it would be a virtual appointment.  Overall she feels things are going very well.  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  She is hoping her daughter who recently graduated can also work there.  She sleeps good.  Her plan is to move out to live with a roommate but decided to stay with her mother because she is not sure if the roommate will stay here longer and their plan is to move to [place] later.  She reported remain sober from drugs and recently had drug test before the job which was negative.  She denies any mania, psychosis, hallucination.  She admitted few pounds weight gain but overall she feels medicine is working.  She had a cellulitis and she was given a lot of antibiotic but now she is feeling much better.  She denies any hallucination or any paranoia.  She has no tremors or shakes.  She denies any panic attack.  She like to keep the current medication.  Past Psychiatric History:  H/O cutting, mood swing, mania, anger, promiscuity, abusing  pain medication, cocaine and ETOH. Diagnosed ADHD, bipolar, BPD, PTSD and anxiety. Saw Dr Ladona Ridgel at [facility].  Took Effexor, BuSpar, gabapentin, Vistaril, Prozac, Cymbalta, Seroquel, trazodone, Zoloft, Lexapro, Klonopin, Abilify, Valium, Adderall, Xanax and Klonopin.  No h/o inpatient or suicidal attempt.  Did CDIOP in Aug 2020. Given baclofen and clonidine.    Psychiatric Specialty Exam: Physical Exam  Review of Systems  Weight 223 lb (101.2 kg).There is no height or weight on file to calculate BMI.  General Appearance: NA  Eye Contact:  NA  Speech:  Clear and Coherent and fast  Volume:  Normal  Mood:  Euthymic  Affect:  NA  Thought Process:  Goal Directed  Orientation:  Full (Time, Place, and Person)  Thought Content:  WDL  Suicidal Thoughts:  No  Homicidal Thoughts:  No  Memory:  Immediate;   Good Recent;   Good Remote;   Fair  Judgement:  Fair  Insight:  Present  Psychomotor Activity:  NA  Concentration:  Concentration: Fair and Attention Span: Fair  Recall:  Good  Fund of Knowledge:  Good  Language:  Good  Akathisia:  No  Handed:  Right  AIMS (if indicated):     Assets:  Communication Skills Desire for Improvement Housing Resilience Social Support Talents/Skills Transportation  ADL's:  Intact  Cognition:  WNL  Sleep:   ok      Assessment and Plan: Bipolar disorder type I.  Alcohol dependence in complete remission.  Anxiety  Patient doing better  on her current medication.  He does not take trazodone as much as she is sleeping better but occasionally she takes on the weekend.  She like refills on her medication but having an appointment with a new provider who is under her insurance network.  So far patient has no side effects from medication.  I will continue Effexor 150 mg daily, Lyrica 150 mg daily, trazodone 100 mg only as needed but patient has enough refills remaining, Abilify 5 mg daily and Lamictal 150 mg twice a day.  Recommended to call us back if she has  any question or any concern in the future she like to re establish care with our office.  I wish her good luck.  We will not schedule any more appointment.  I recommend if she wants records to be sent to the new provider then we will need her consent and she agreed with the plan.  Follow Up Instructions:    I discussed the assessment and treatment plan with the patient. The patient was provided an opportunity to ask questions and all were answered. The patient agreed with the plan and demonstrated an understanding of the instructions.   The patient was advised to call back or seek an in-person evaluation if the symptoms worsen or if the condition fails to improve as anticipated.  Collaboration of Care: Primary Care Provider AEB notes are available in epic to review.  Patient/Guardian was advised Release of Information must be obtained prior to any record release in order to collaborate their care with an outside provider. Patient/Guardian was advised if they have not already done so to contact the registration department to sign all necessary forms in order for [place] to release information regarding their care.   Consent: Patient/Guardian gives verbal consent for treatment and assignment of benefits for services provided during this visit. Patient/Guardian expressed understanding and agreed to proceed.    I provided 20 minutes of non-face-to-face time during this encounter.   Cleotis Nipper, MD

## 2022-01-28 ENCOUNTER — Ambulatory Visit: Payer: Self-pay | Admitting: Nurse Practitioner

## 2022-01-28 DIAGNOSIS — Z0289 Encounter for other administrative examinations: Secondary | ICD-10-CM

## 2022-03-05 ENCOUNTER — Encounter (HOSPITAL_COMMUNITY): Payer: Self-pay | Admitting: Emergency Medicine

## 2022-03-05 ENCOUNTER — Emergency Department (HOSPITAL_COMMUNITY)
Admission: EM | Admit: 2022-03-05 | Discharge: 2022-03-05 | Disposition: A | Discharge status: Home / Self Care | Payer: 59 | Attending: Emergency Medicine | Admitting: Emergency Medicine

## 2022-03-05 ENCOUNTER — Emergency Department (HOSPITAL_COMMUNITY): Payer: 59

## 2022-03-05 DIAGNOSIS — T402X1A Poisoning by other opioids, accidental (unintentional), initial encounter: Secondary | ICD-10-CM | POA: Diagnosis present

## 2022-03-05 DIAGNOSIS — Z91040 Latex allergy status: Secondary | ICD-10-CM | POA: Diagnosis not present

## 2022-03-05 DIAGNOSIS — T40604A Poisoning by unspecified narcotics, undetermined, initial encounter: Secondary | ICD-10-CM

## 2022-03-05 NOTE — ED Provider Notes (Signed)
[facility]EM[facility]GENCY DEPT Provider Note   CSN: [id number] Arrival date & time: 03/05/22  1835     History  Chief Complaint  Patient presents with   Drug Overdose    Tammy Wilkinson is a 36 y.o. female.  36 year old female who presents after taking 30 mg of oxycodone and becoming unresponsive.  Patient states she has chronic back pain and she was trying to alleviate her symptoms.  Denies that this was a suicide attempt.  Bystanders called EMS and patient was given 2 mg of Narcan IM along with 1 mg of Narcan IV.  She is now alert.  Denies any other coingestions at this time.  She is not short of breath       Home Medications Prior to Admission medications   Medication Sig Start Date End Date Taking? Authorizing Provider  acetaminophen (TYLENOL) 500 MG tablet Take 1,000-1,500 mg by mouth 2 (two) times daily as needed (pain).    [provider]  albuterol (VENTOLIN HFA) 108 (90 Base) MCG/ACT inhaler Inhale 2 puffs into the lungs every 6 (six) hours as needed for wheezing or shortness of breath. 12/02/18   [provider]  amphetamine-dextroamphetamine (ADDERALL) 20 MG tablet Take 20 mg by mouth every morning.    [provider]  ARIPiprazole (ABILIFY) 5 MG tablet Take 1 tablet (5 mg total) by mouth daily. 01/25/22   Arfeen, Phillips Grout, MD  Cyanocobalamin (VITAMIN B-12 PO) Take 1 tablet by mouth every morning.    [provider]  fluticasone (FLONASE) 50 MCG/ACT nasal spray Place 1 spray into both nostrils daily.    [provider]  ibuprofen (ADVIL) 200 MG tablet Take 600-800 mg by mouth 2 (two) times daily as needed (pain).    [provider]  lamoTRIgine (LAMICTAL) 150 MG tablet Take 1 tablet (150 mg total) by mouth 2 (two) times daily. 01/25/22 04/25/22  Arfeen, Phillips Grout, MD  levonorgestrel (MIRENA) 20 MCG/DAY IUD 1 each by Intrauterine route once. Implanted 11/30/21 11/30/21   [provider]  pregabalin  (LYRICA) 150 MG capsule Take 1 capsule (150 mg total) by mouth daily. 01/25/22   Arfeen, Phillips Grout, MD  SUMAtriptan (IMITREX) 20 MG/ACT nasal spray Place 20 mg into the nose See admin instructions. Instill one spray (20 mg) nasally at onset of migraine headache, may repeat in 2 hours if still needed 12/27/19   [provider]  traZODone (DESYREL) 100 MG tablet TAKE 1 TABLET(100 MG) BY MOUTH AT BEDTIME Patient taking differently: Take 100 mg by mouth at bedtime. 10/26/21   Arfeen, Phillips Grout, MD  venlafaxine XR (EFFEXOR-XR) 150 MG 24 hr capsule Take 1 capsule (150 mg total) by mouth at bedtime. 01/25/22   Cleotis Nipper, MD      Allergies    Codeine, Amoxil [amoxicillin], Penicillins, and Latex    Review of Systems   Review of Systems  All other systems reviewed and are negative.   Physical Exam Updated Vital Signs BP 137/88   Pulse (!) 108   Temp (!) 97.4 F (36.3 C) (Oral)   Resp 11   Ht 1.626 m (5\' 4" )   Wt 90.7 kg   SpO2 93%   BMI 34.33 kg/m  Physical Exam Vitals and nursing note reviewed.  Constitutional:      General: She is not in acute distress.    Appearance: Normal appearance. She is well-developed. She is not toxic-appearing.  HENT:     Head: Normocephalic and atraumatic.  Eyes:  General: Lids are normal.     Conjunctiva/sclera: Conjunctivae normal.     Pupils: Pupils are equal, round, and reactive to light.  Neck:     Thyroid: No thyroid mass.     Trachea: No tracheal deviation.  Cardiovascular:     Rate and Rhythm: Normal rate and regular rhythm.     Heart sounds: Normal heart sounds. No murmur heard.    No gallop.  Pulmonary:     Effort: Pulmonary effort is normal. No respiratory distress.     Breath sounds: Normal breath sounds. No stridor. No decreased breath sounds, wheezing, rhonchi or rales.  Abdominal:     General: There is no distension.     Palpations: Abdomen is soft.     Tenderness: There is no abdominal tenderness. There is no rebound.   Musculoskeletal:        General: No tenderness. Normal range of motion.     Cervical back: Normal range of motion and neck supple.  Skin:    General: Skin is warm and dry.     Findings: No abrasion or rash.  Neurological:     Mental Status: She is alert and oriented to person, place, and time. Mental status is at baseline.     GCS: GCS eye subscore is 4. GCS verbal subscore is 5. GCS motor subscore is 6.     Cranial Nerves: No cranial nerve deficit.     Sensory: No sensory deficit.     Motor: Motor function is intact.  Psychiatric:        Attention and Perception: Attention normal.        Speech: Speech normal.        Behavior: Behavior normal.     ED Results / Procedures / Treatments   Labs (all labs ordered are listed, but only abnormal results are displayed) Labs Reviewed - No data to display  EKG EKG Interpretation  Date/Time:  Saturday March 05 2022 18:44:18 EDT Ventricular Rate:  114 PR Interval:  171 QRS Duration: 103 QT Interval:  345 QTC Calculation: 476 R Axis:   92 Text Interpretation: Sinus tachycardia Consider right atrial enlargement Borderline right axis deviation Borderline prolonged QT interval Confirmed by Lorre Nick ([id number]) on 03/05/2022 10:06:18 PM  Radiology No results found.  Procedures Procedures    Medications Ordered in ED Medications - No data to display  ED Course/ Medical Decision Making/ A&P                           Medical Decision Making Amount and/or Complexity of Data Reviewed Radiology: ordered.   Patient's EKG per interpretation is shows sinus tachycardia.  Likely from her recent drug use.  Patient had transient hypoxemia noted on her monitor however patient does appear to have sleep apnea with very snoring.  Chest x-ray performed which was negative.  Low suspicion for aspiration pneumonia.  Monitored here and she is awake and alert.  Will be discharged with her family.  CRITICAL CARE Performed by: Toy Baker Total critical care time: 45 minutes Critical care time was exclusive of separately billable procedures and treating other patients. Critical care was necessary to treat or prevent imminent or life-threatening deterioration. Critical care was time spent personally by me on the following activities: development of treatment plan with patient and/or surrogate as well as nursing, discussions with consultants, evaluation of patient's response to treatment, examination of patient, obtaining history from patient or surrogate, ordering and  performing treatments and interventions, ordering and review of laboratory studies, ordering and review of radiographic studies, pulse oximetry and re-evaluation of patient's condition.         Final Clinical Impression(s) / ED Diagnoses Final diagnoses:  None    Rx / DC Orders ED Discharge Orders     None         Lorre Nick, MD 03/05/22 2207

## 2022-03-05 NOTE — ED Triage Notes (Signed)
Pt arrives via EMS from home with overdose after taking 3 oxycodone 10mg . Pt has chronic back pain and took friends medication. Pt was unresponsive and EMS gave 2 mg narcan IM and 1 mg IV. Pt alert and talking on arrival. Pt denies any SI

## 2022-03-05 NOTE — ED Notes (Signed)
Pt oxygen saturation noted to be 79-82% on room air. Placed pt on oxygen via nasal cannula and increased flow rate until oxygen saturation reached 95+%. Currently on 4L per minute nasal cannula. Notified primary RN and ED MD.

## 2022-03-05 NOTE — ED Notes (Signed)
Pt. Maintains O2 of 89-96% while ambulating in the hallway.

## 2023-01-26 ENCOUNTER — Other Ambulatory Visit (HOSPITAL_COMMUNITY): Payer: Self-pay | Admitting: Psychiatry

## 2023-01-26 DIAGNOSIS — F3131 Bipolar disorder, current episode depressed, mild: Secondary | ICD-10-CM

## 2023-06-18 DIAGNOSIS — Z419 Encounter for procedure for purposes other than remedying health state, unspecified: Secondary | ICD-10-CM | POA: Diagnosis not present

## 2023-07-16 IMAGING — DX DG CHEST 2V
2 series · 2 of 2 positions shown · non-contrast
Comparison: Chest x-ray 03/29/2013

CLINICAL DATA: Suspected sepsis.

EXAM:
CHEST - 2 VIEW

[chest pa]
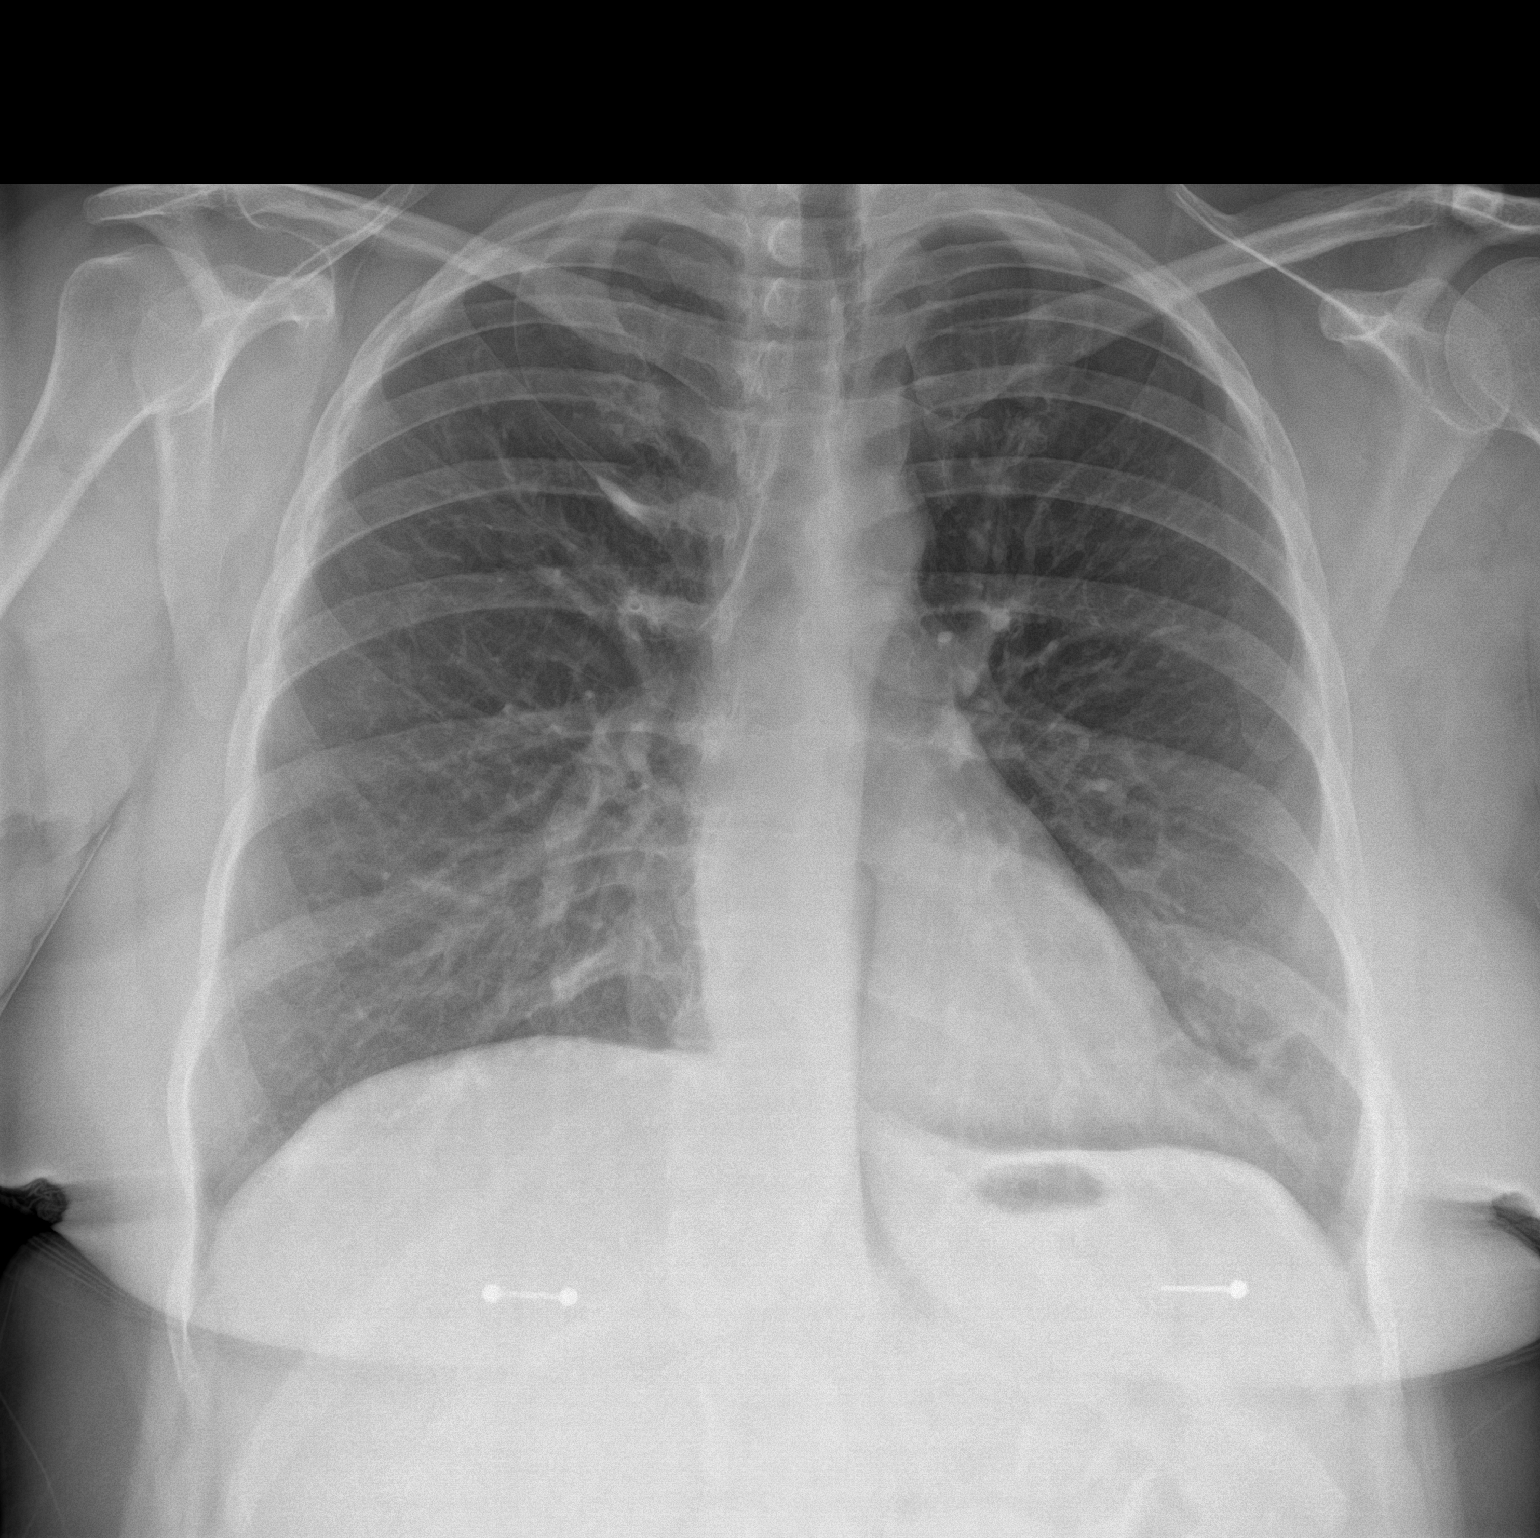

[chest lat]
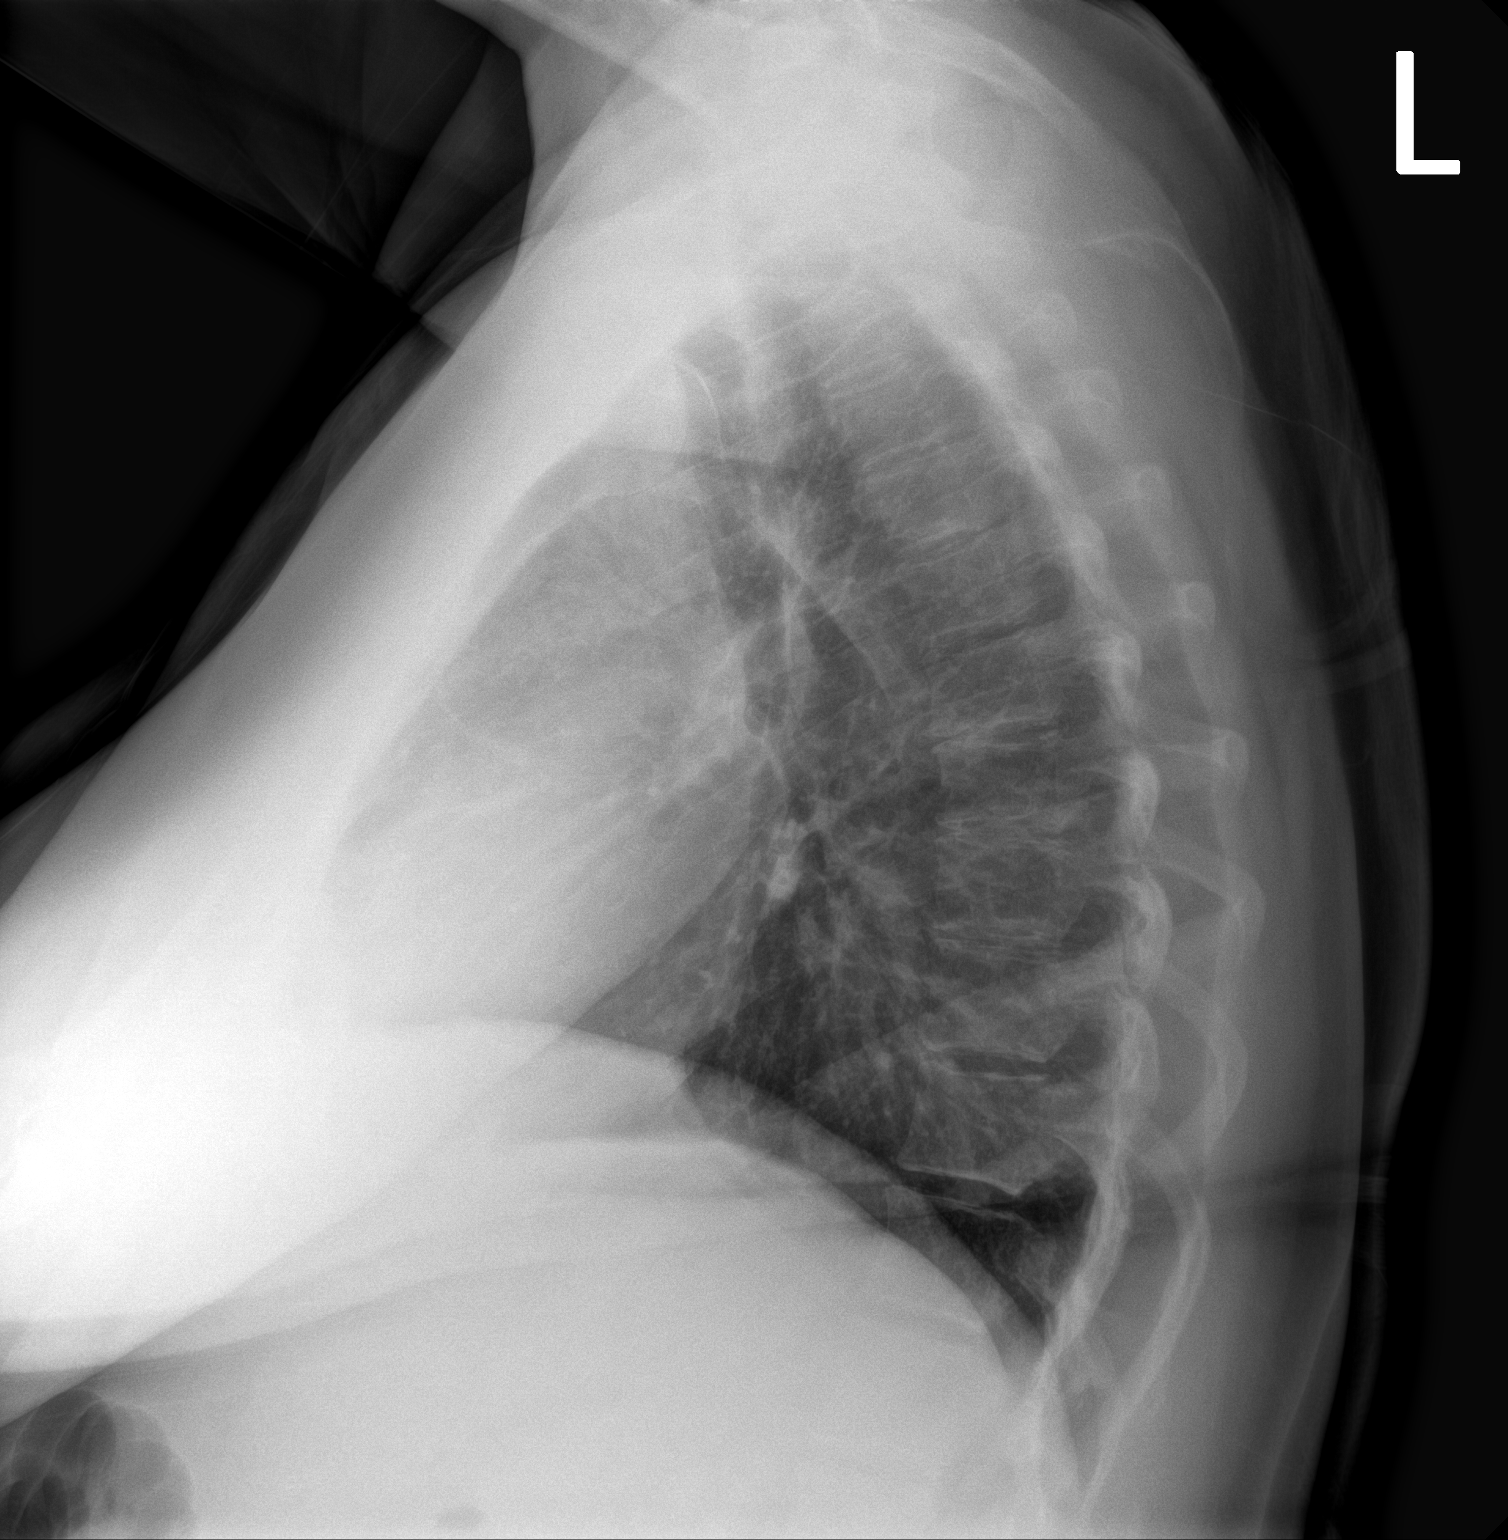

[2 of 2 positions shown; findings below may reference images not displayed]

FINDINGS: The heart size and mediastinal contours are within normal limits.
Both lungs are clear. The visualized skeletal structures are
unremarkable.
IMPRESSION: No active cardiopulmonary disease.

## 2023-07-16 IMAGING — US US EXTREM UP*L* LTD
1 series · 6 of 6 positions shown · non-contrast
Comparison: None Available.

CLINICAL DATA: Left upper arm swelling/redness x2 days, status post
Nexplanon removal

EXAM:
ULTRASOUND LEFT UPPER EXTREMITY LIMITED
TECHNIQUE: Ultrasound examination of the upper extremity soft tissues was
performed in the area of clinical concern.

[Series 1: us soft tissue upper extremity limited left (non-v · 6 acquisitions, 6 frames shown]
[im 1/6]
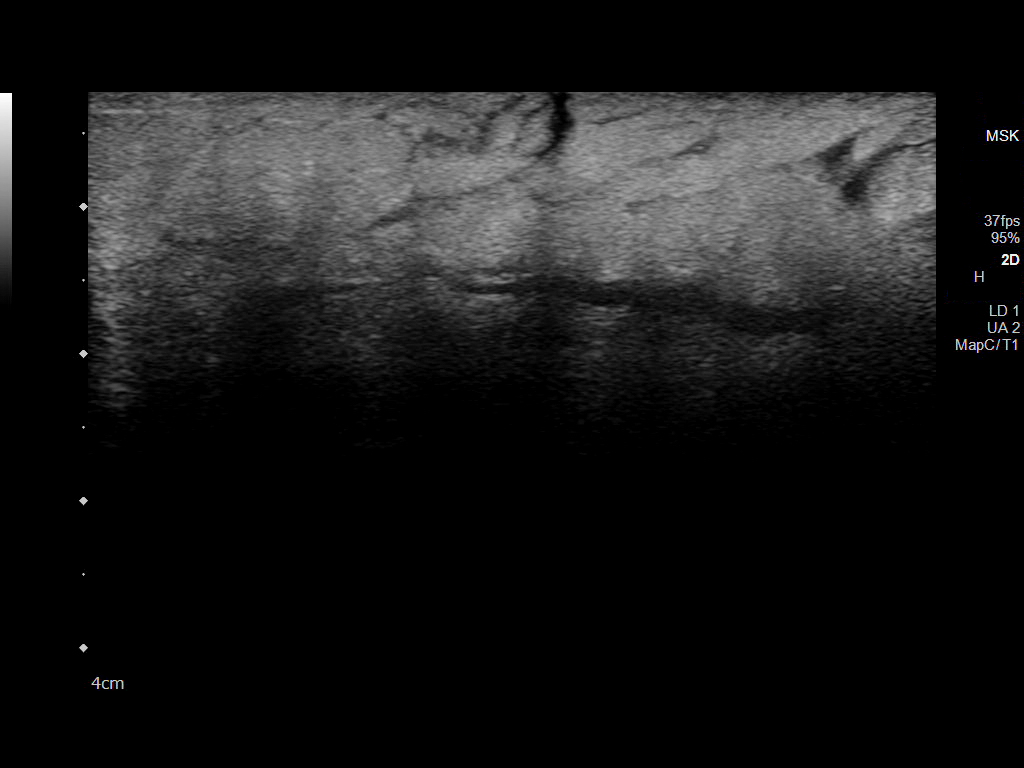
[im 2/6]
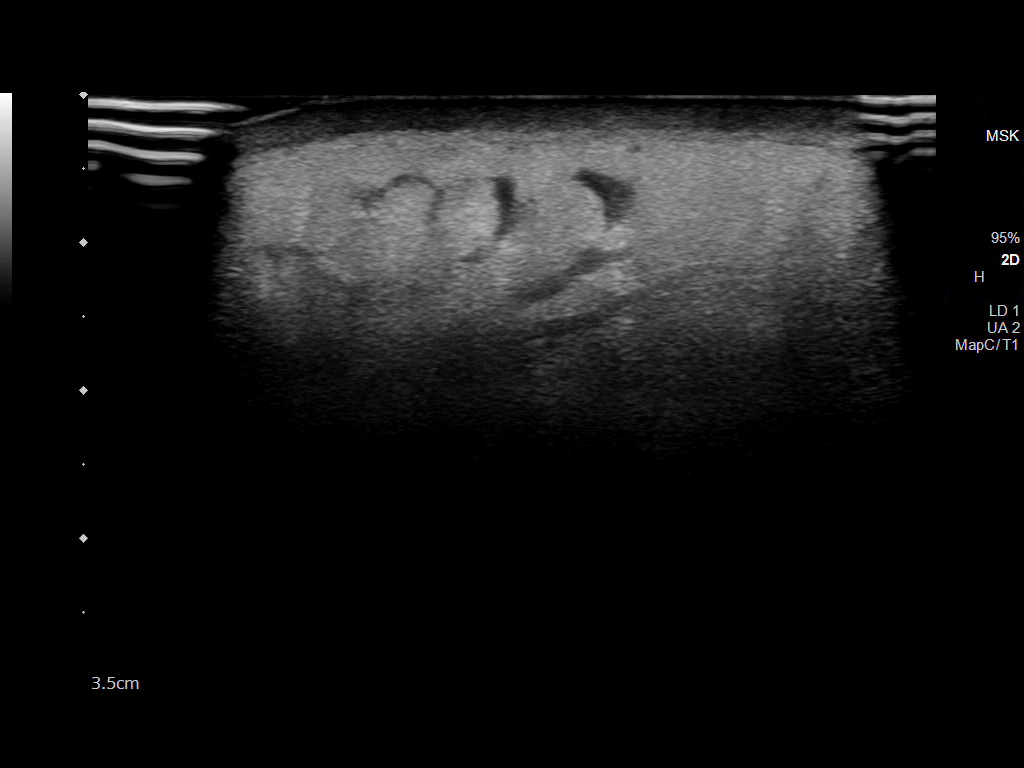
[im 3/6]
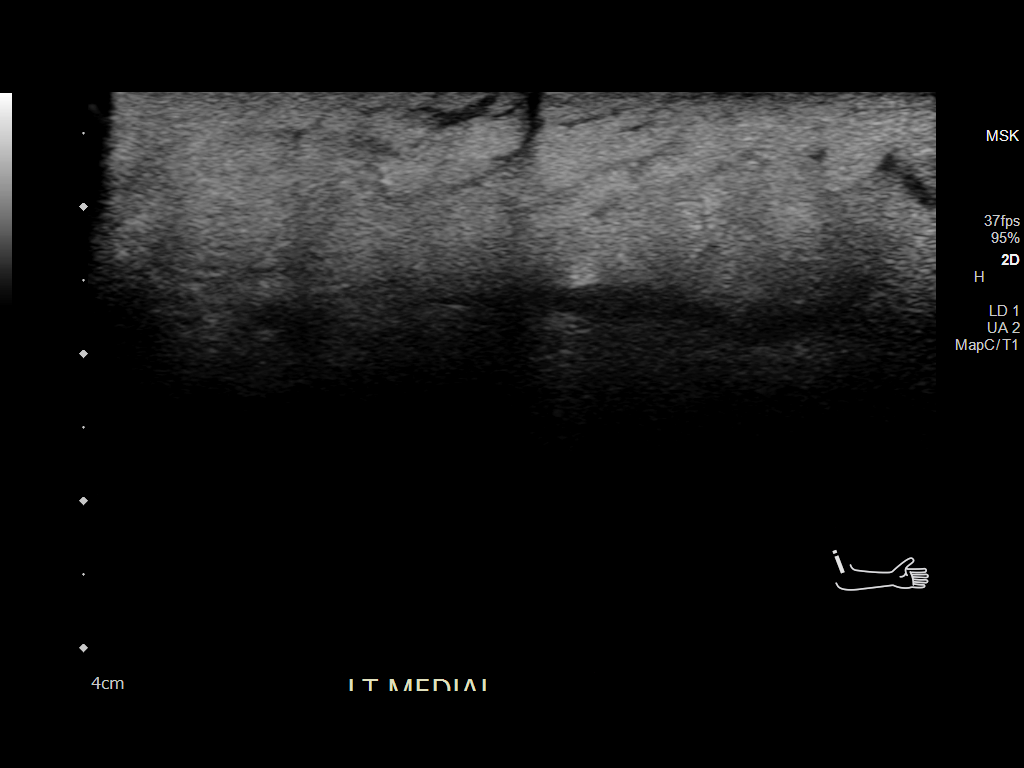
[im 4/6]
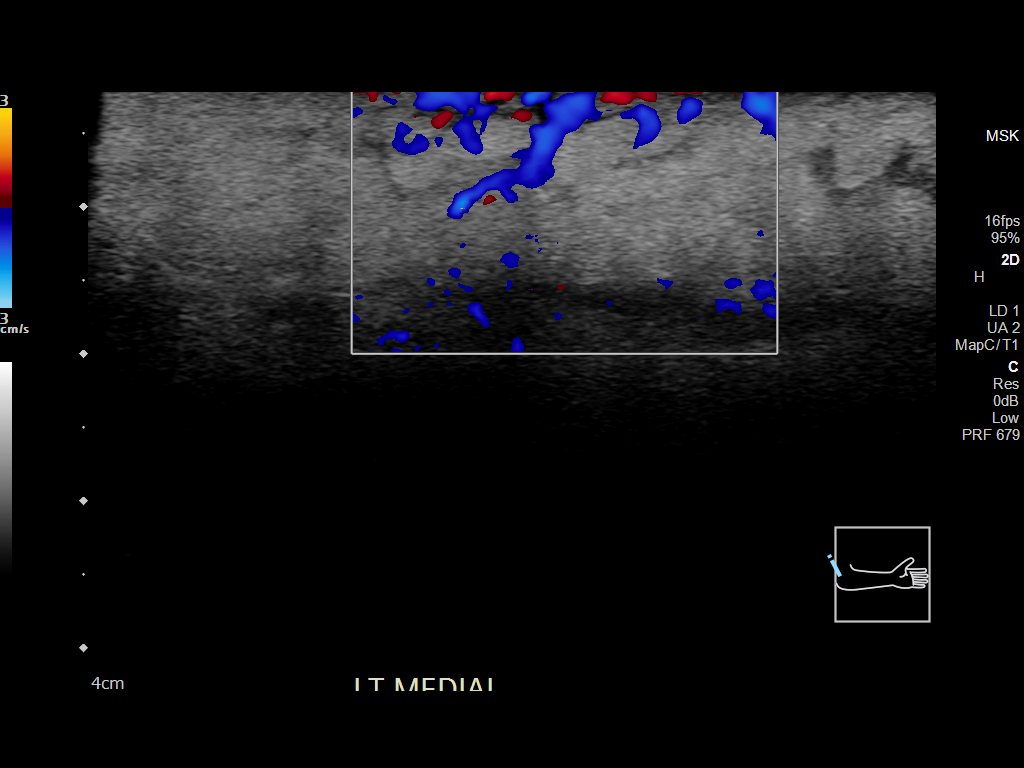
[im 5/6]
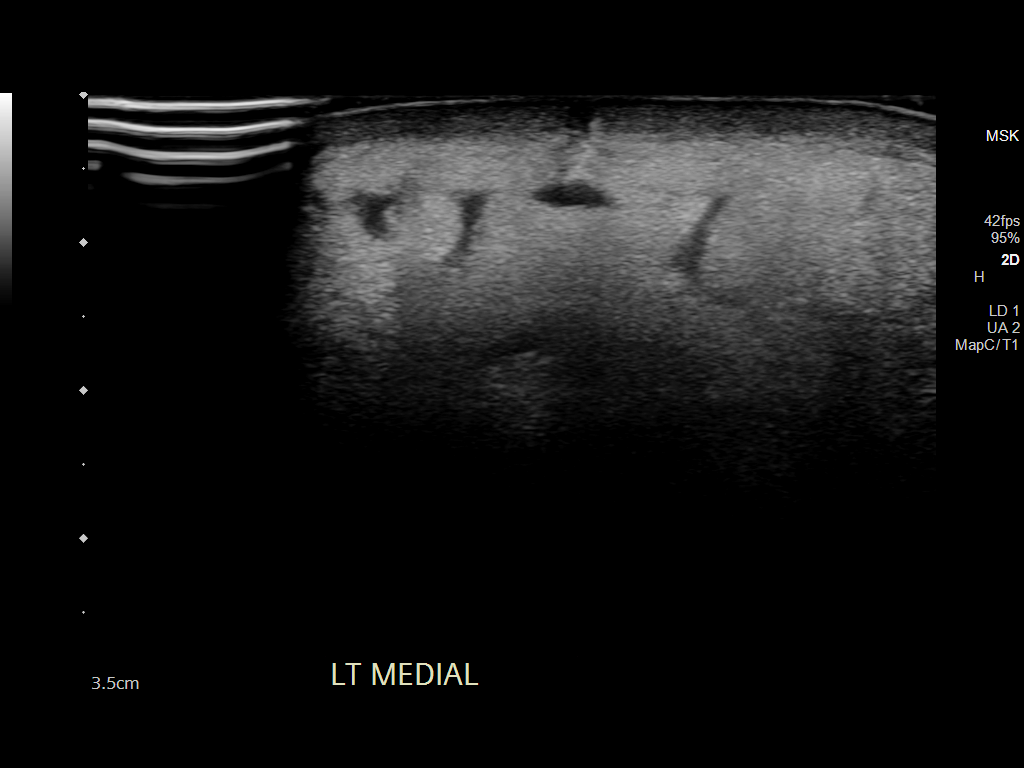
[im 6/6]
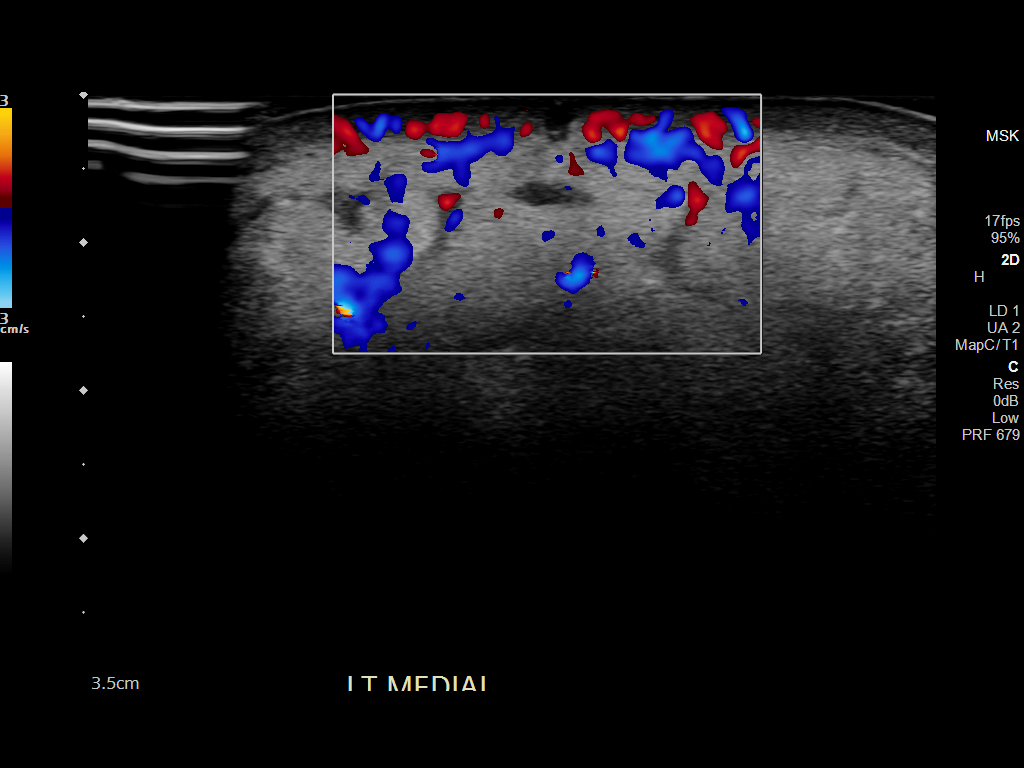

[6 of 6 positions shown; findings below may reference images not displayed]

FINDINGS: Soft tissue ultrasound was performed in the area of clinical concern
(left upper extremity). Subcutaneous edema is present. No drainable
fluid collection/abscess.
IMPRESSION: Subcutaneous edema in the area of clinical concern, compatible with
the clinical history of cellulitis.

No drainable fluid collection/abscess.

## 2023-07-19 DIAGNOSIS — Z419 Encounter for procedure for purposes other than remedying health state, unspecified: Secondary | ICD-10-CM | POA: Diagnosis not present

## 2023-08-09 DIAGNOSIS — F3132 Bipolar disorder, current episode depressed, moderate: Secondary | ICD-10-CM | POA: Diagnosis not present

## 2023-08-09 DIAGNOSIS — F902 Attention-deficit hyperactivity disorder, combined type: Secondary | ICD-10-CM | POA: Diagnosis not present

## 2023-08-09 DIAGNOSIS — F411 Generalized anxiety disorder: Secondary | ICD-10-CM | POA: Diagnosis not present

## 2023-08-19 DIAGNOSIS — Z419 Encounter for procedure for purposes other than remedying health state, unspecified: Secondary | ICD-10-CM | POA: Diagnosis not present

## 2023-09-16 DIAGNOSIS — Z419 Encounter for procedure for purposes other than remedying health state, unspecified: Secondary | ICD-10-CM | POA: Diagnosis not present

## 2023-09-21 DIAGNOSIS — F902 Attention-deficit hyperactivity disorder, combined type: Secondary | ICD-10-CM | POA: Diagnosis not present

## 2023-09-21 DIAGNOSIS — F411 Generalized anxiety disorder: Secondary | ICD-10-CM | POA: Diagnosis not present

## 2023-09-21 DIAGNOSIS — F3132 Bipolar disorder, current episode depressed, moderate: Secondary | ICD-10-CM | POA: Diagnosis not present

## 2023-10-28 DIAGNOSIS — Z419 Encounter for procedure for purposes other than remedying health state, unspecified: Secondary | ICD-10-CM | POA: Diagnosis not present

## 2023-11-02 DIAGNOSIS — F411 Generalized anxiety disorder: Secondary | ICD-10-CM | POA: Diagnosis not present

## 2023-11-02 DIAGNOSIS — F902 Attention-deficit hyperactivity disorder, combined type: Secondary | ICD-10-CM | POA: Diagnosis not present

## 2023-11-02 DIAGNOSIS — F3181 Bipolar II disorder: Secondary | ICD-10-CM | POA: Diagnosis not present

## 2023-11-27 DIAGNOSIS — Z419 Encounter for procedure for purposes other than remedying health state, unspecified: Secondary | ICD-10-CM | POA: Diagnosis not present

## 2023-12-19 DIAGNOSIS — F902 Attention-deficit hyperactivity disorder, combined type: Secondary | ICD-10-CM | POA: Diagnosis not present

## 2023-12-19 DIAGNOSIS — F3181 Bipolar II disorder: Secondary | ICD-10-CM | POA: Diagnosis not present

## 2023-12-19 DIAGNOSIS — F411 Generalized anxiety disorder: Secondary | ICD-10-CM | POA: Diagnosis not present

## 2023-12-28 DIAGNOSIS — Z419 Encounter for procedure for purposes other than remedying health state, unspecified: Secondary | ICD-10-CM | POA: Diagnosis not present

## 2024-01-27 DIAGNOSIS — Z419 Encounter for procedure for purposes other than remedying health state, unspecified: Secondary | ICD-10-CM | POA: Diagnosis not present

## 2024-02-22 DIAGNOSIS — F411 Generalized anxiety disorder: Secondary | ICD-10-CM | POA: Diagnosis not present

## 2024-02-22 DIAGNOSIS — F902 Attention-deficit hyperactivity disorder, combined type: Secondary | ICD-10-CM | POA: Diagnosis not present

## 2024-02-22 DIAGNOSIS — F3181 Bipolar II disorder: Secondary | ICD-10-CM | POA: Diagnosis not present

## 2024-02-27 DIAGNOSIS — Z419 Encounter for procedure for purposes other than remedying health state, unspecified: Secondary | ICD-10-CM | POA: Diagnosis not present

## 2024-03-27 DIAGNOSIS — F411 Generalized anxiety disorder: Secondary | ICD-10-CM | POA: Diagnosis not present

## 2024-03-27 DIAGNOSIS — F902 Attention-deficit hyperactivity disorder, combined type: Secondary | ICD-10-CM | POA: Diagnosis not present

## 2024-03-27 DIAGNOSIS — Z79899 Other long term (current) drug therapy: Secondary | ICD-10-CM | POA: Diagnosis not present

## 2024-03-27 DIAGNOSIS — F3181 Bipolar II disorder: Secondary | ICD-10-CM | POA: Diagnosis not present

## 2024-03-29 DIAGNOSIS — Z419 Encounter for procedure for purposes other than remedying health state, unspecified: Secondary | ICD-10-CM | POA: Diagnosis not present

## 2024-04-28 DIAGNOSIS — Z419 Encounter for procedure for purposes other than remedying health state, unspecified: Secondary | ICD-10-CM | POA: Diagnosis not present

## 2024-05-29 DIAGNOSIS — Z419 Encounter for procedure for purposes other than remedying health state, unspecified: Secondary | ICD-10-CM | POA: Diagnosis not present
